# Patient Record
Sex: Female | Born: 1945 | Race: White | Hispanic: No | Smoking: Never smoker
Health system: Southern US, Community
[De-identification: ages and names within clinical notes are randomized; demographics above are authoritative.]

## PROBLEM LIST (undated history)

## (undated) DIAGNOSIS — R51 Headache: Secondary | ICD-10-CM

## (undated) DIAGNOSIS — G629 Polyneuropathy, unspecified: Secondary | ICD-10-CM

## (undated) DIAGNOSIS — T8859XA Other complications of anesthesia, initial encounter: Secondary | ICD-10-CM

## (undated) DIAGNOSIS — F329 Major depressive disorder, single episode, unspecified: Secondary | ICD-10-CM

## (undated) DIAGNOSIS — J449 Chronic obstructive pulmonary disease, unspecified: Secondary | ICD-10-CM

## (undated) DIAGNOSIS — F32A Depression, unspecified: Secondary | ICD-10-CM

## (undated) DIAGNOSIS — IMO0002 Reserved for concepts with insufficient information to code with codable children: Secondary | ICD-10-CM

## (undated) DIAGNOSIS — H3530 Unspecified macular degeneration: Secondary | ICD-10-CM

## (undated) DIAGNOSIS — G2581 Restless legs syndrome: Secondary | ICD-10-CM

## (undated) DIAGNOSIS — M329 Systemic lupus erythematosus, unspecified: Secondary | ICD-10-CM

## (undated) DIAGNOSIS — K219 Gastro-esophageal reflux disease without esophagitis: Secondary | ICD-10-CM

## (undated) DIAGNOSIS — I10 Essential (primary) hypertension: Secondary | ICD-10-CM

## (undated) DIAGNOSIS — M797 Fibromyalgia: Secondary | ICD-10-CM

## (undated) DIAGNOSIS — J45909 Unspecified asthma, uncomplicated: Secondary | ICD-10-CM

## (undated) DIAGNOSIS — G4733 Obstructive sleep apnea (adult) (pediatric): Secondary | ICD-10-CM

## (undated) DIAGNOSIS — M1990 Unspecified osteoarthritis, unspecified site: Secondary | ICD-10-CM

## (undated) DIAGNOSIS — I471 Supraventricular tachycardia: Secondary | ICD-10-CM

## (undated) DIAGNOSIS — G43909 Migraine, unspecified, not intractable, without status migrainosus: Secondary | ICD-10-CM

## (undated) DIAGNOSIS — T4145XA Adverse effect of unspecified anesthetic, initial encounter: Secondary | ICD-10-CM

## (undated) DIAGNOSIS — K279 Peptic ulcer, site unspecified, unspecified as acute or chronic, without hemorrhage or perforation: Secondary | ICD-10-CM

## (undated) DIAGNOSIS — E785 Hyperlipidemia, unspecified: Secondary | ICD-10-CM

## (undated) HISTORY — PX: APPENDECTOMY: SHX54

## (undated) HISTORY — PX: NEUROMA SURGERY: SHX722

## (undated) HISTORY — DX: Peptic ulcer, site unspecified, unspecified as acute or chronic, without hemorrhage or perforation: K27.9

## (undated) HISTORY — PX: CERVICAL FUSION: SHX112

## (undated) HISTORY — PX: CARPAL TUNNEL RELEASE: SHX101

## (undated) HISTORY — PX: KNEE ARTHROSCOPY: SUR90

## (undated) HISTORY — PX: EYE SURGERY: SHX253

## (undated) HISTORY — DX: Obstructive sleep apnea (adult) (pediatric): G47.33

## (undated) HISTORY — DX: Hyperlipidemia, unspecified: E78.5

## (undated) HISTORY — PX: BACK SURGERY: SHX140

## (undated) HISTORY — PX: TONSILLECTOMY: SUR1361

## (undated) HISTORY — PX: SHOULDER SURGERY: SHX246

## (undated) HISTORY — PX: ABDOMINAL HYSTERECTOMY: SHX81

## (undated) HISTORY — DX: Migraine, unspecified, not intractable, without status migrainosus: G43.909

---

## 1998-04-13 ENCOUNTER — Inpatient Hospital Stay (HOSPITAL_COMMUNITY): Admission: RE | Admit: 1998-04-13 | Discharge: 1998-04-15 | Payer: Self-pay | Admitting: Neurosurgery

## 1998-06-08 ENCOUNTER — Ambulatory Visit (HOSPITAL_COMMUNITY): Admission: RE | Admit: 1998-06-08 | Discharge: 1998-06-08 | Payer: Self-pay | Admitting: Neurosurgery

## 1998-06-08 ENCOUNTER — Encounter: Payer: Self-pay | Admitting: Neurosurgery

## 2000-07-10 ENCOUNTER — Other Ambulatory Visit: Admission: RE | Admit: 2000-07-10 | Discharge: 2000-07-10 | Payer: Self-pay | Admitting: Obstetrics and Gynecology

## 2000-09-09 ENCOUNTER — Ambulatory Visit (HOSPITAL_COMMUNITY): Admission: RE | Admit: 2000-09-09 | Discharge: 2000-09-09 | Payer: Self-pay | Admitting: Neurosurgery

## 2000-10-29 ENCOUNTER — Ambulatory Visit (HOSPITAL_BASED_OUTPATIENT_CLINIC_OR_DEPARTMENT_OTHER): Admission: RE | Admit: 2000-10-29 | Discharge: 2000-10-29 | Payer: Self-pay | Admitting: Neurosurgery

## 2002-01-13 ENCOUNTER — Ambulatory Visit (HOSPITAL_BASED_OUTPATIENT_CLINIC_OR_DEPARTMENT_OTHER): Admission: RE | Admit: 2002-01-13 | Discharge: 2002-01-13 | Payer: Self-pay | Admitting: *Deleted

## 2002-05-12 ENCOUNTER — Ambulatory Visit (HOSPITAL_BASED_OUTPATIENT_CLINIC_OR_DEPARTMENT_OTHER): Admission: RE | Admit: 2002-05-12 | Discharge: 2002-05-12 | Payer: Self-pay | Admitting: Internal Medicine

## 2002-05-20 ENCOUNTER — Ambulatory Visit (HOSPITAL_COMMUNITY): Admission: RE | Admit: 2002-05-20 | Discharge: 2002-05-20 | Payer: Self-pay | Admitting: *Deleted

## 2002-05-20 ENCOUNTER — Encounter (INDEPENDENT_AMBULATORY_CARE_PROVIDER_SITE_OTHER): Payer: Self-pay | Admitting: *Deleted

## 2002-12-21 ENCOUNTER — Encounter: Payer: Self-pay | Admitting: Family Medicine

## 2002-12-21 ENCOUNTER — Encounter: Admission: RE | Admit: 2002-12-21 | Discharge: 2002-12-21 | Payer: Self-pay | Admitting: Family Medicine

## 2003-11-20 ENCOUNTER — Other Ambulatory Visit: Admission: RE | Admit: 2003-11-20 | Discharge: 2003-11-20 | Payer: Self-pay | Admitting: Family Medicine

## 2004-02-07 ENCOUNTER — Encounter: Admission: RE | Admit: 2004-02-07 | Discharge: 2004-02-07 | Payer: Self-pay | Admitting: Family Medicine

## 2005-06-05 ENCOUNTER — Other Ambulatory Visit: Admission: RE | Admit: 2005-06-05 | Discharge: 2005-06-05 | Payer: Self-pay | Admitting: Family Medicine

## 2005-10-09 ENCOUNTER — Encounter: Admission: RE | Admit: 2005-10-09 | Discharge: 2005-10-09 | Payer: Self-pay | Admitting: Family Medicine

## 2006-06-02 ENCOUNTER — Other Ambulatory Visit: Admission: RE | Admit: 2006-06-02 | Discharge: 2006-06-02 | Payer: Self-pay | Admitting: Family Medicine

## 2006-11-16 ENCOUNTER — Encounter: Admission: RE | Admit: 2006-11-16 | Discharge: 2006-11-16 | Payer: Self-pay | Admitting: Family Medicine

## 2007-03-15 ENCOUNTER — Encounter: Admission: RE | Admit: 2007-03-15 | Discharge: 2007-03-15 | Payer: Self-pay | Admitting: Internal Medicine

## 2007-04-19 ENCOUNTER — Ambulatory Visit (HOSPITAL_COMMUNITY): Admission: RE | Admit: 2007-04-19 | Discharge: 2007-04-20 | Payer: Self-pay | Admitting: Orthopaedic Surgery

## 2008-01-04 ENCOUNTER — Encounter: Admission: RE | Admit: 2008-01-04 | Discharge: 2008-01-04 | Payer: Self-pay | Admitting: Family Medicine

## 2009-01-05 ENCOUNTER — Encounter: Admission: RE | Admit: 2009-01-05 | Discharge: 2009-01-05 | Payer: Self-pay | Admitting: Family Medicine

## 2009-02-26 ENCOUNTER — Encounter: Admission: RE | Admit: 2009-02-26 | Discharge: 2009-04-12 | Payer: Self-pay | Admitting: Rheumatology

## 2010-01-15 ENCOUNTER — Encounter: Admission: RE | Admit: 2010-01-15 | Discharge: 2010-01-15 | Payer: Self-pay | Admitting: Family Medicine

## 2011-01-07 ENCOUNTER — Other Ambulatory Visit: Payer: Self-pay | Admitting: Family Medicine

## 2011-01-07 DIAGNOSIS — Z1231 Encounter for screening mammogram for malignant neoplasm of breast: Secondary | ICD-10-CM

## 2011-01-27 ENCOUNTER — Ambulatory Visit
Admission: RE | Admit: 2011-01-27 | Discharge: 2011-01-27 | Disposition: A | Discharge status: Home / Self Care | Payer: MEDICARE | Source: Ambulatory Visit | Attending: Family Medicine | Admitting: Family Medicine

## 2011-01-27 DIAGNOSIS — Z1231 Encounter for screening mammogram for malignant neoplasm of breast: Secondary | ICD-10-CM

## 2011-02-25 NOTE — Op Note (Signed)
NAMEChrys Watts, Jessica Watts             ACCOUNT NO.:  [id number]   MEDICAL RECORD NO.:  [id number]          PATIENT TYPE:  AMB   LOCATION:  SDS                          FACILITY:  [facility]   PHYSICIAN:  Mark C. Ophelia Charter, M.D.    DATE OF BIRTH:  1946-03-08   DATE OF PROCEDURE:  04/19/2007  DATE OF DISCHARGE:                               OPERATIVE REPORT   PREOPERATIVE DIAGNOSES:  Left shoulder rotator cuff tear.   POSTOPERATIVE DIAGNOSES:  Left shoulder superior labral SLAP tear,  biceps tendinopathy and rotator cuff tear.   PROCEDURE:  Diagnostic and operative arthroscopy, left shoulder.  Arthroscopic labral debridement.  Biceps tendon arthroscopic release.  Open biceps tenodesis and rotator cuff repair.   SURGEON:  Annell Greening, MD.   ANESTHESIA:  Preoperative scalene block plus 8 mL local plus general  anesthesia.   PROCEDURE:  After induction of general anesthesia, the patient was  placed in the beach-chair position, preoperative antibiotics were given,  time-out procedure was taken once the patient had been positioned and  prepped as well as draped.  The usual split sheets, impervious  stockinette, Coban, arthroscopic sheets, drapes and pouch were applied.  The arthroscope was introduced from a posterior approach, shoulder was  visualized, there was excellent block and significant laxity, easy  visualization of the shoulder, no glenohumeral arthropathy with fraying  and tearing of the posterior labrum posterior to the biceps anchor and  anterior to the biceps anchor was peeled off of bone.  There was some  pinning that extended into the subscap pouch but no true Bankart lesion  or Hill-Sachs lesion. The undersurface of the rotator cuff showed some  tearing following the supraspinatus which did not appear as bed as the  MRI suggested.  The biceps tendon anchor was debrided with the shaver  and then released with the arthroscopic scissors. The incision was made  along the anterior aspect  of the acromion, the deltoid was peeled off,  shoulder was flexed up, bicipital groove was palpated, biceps tendon was  delivered after spreading in line with the fibers, grasping with a right-  angle clamp and delivering it through the incision. 0.2 mm of tendon was  cut off, the bicipital groove was freshened up to good bleeding bone,  holes drilled. A 7-mm reamer and 7 x 23 biocorkscrew was used to  biotenodese the biceps tendon after a #2 FiberWire suture had been  placed in a Bunnell weave with both ends of the suture exiting right  through the end of the tendon.  The longest end was run through the tip  of the screw using the wire feeder.  The screw was pushed in the hole  along with the tendon, tightened down securely.  Pulled on, tested and  was secure.  However reached full extension, the two ends of suture were  tied together. Inspection showed the tendon went in the hole directly  with the biotenodesis screw.  After irrigation, the biceps tendon area  was then inspected and was dry.  The rotator cuff tear was seen at the  supraspinatus portion and was cut back 2-3  mm with good bleeding bone  surface and fixed with a 5 mm biotenodesis corkscrew. The bone was  tapped after using the awl. Insertion of the screw and then suturing  down of the rotator cuff to good bleeding bone surface oversewing over  the top.  The undersurface the chromium had been removed removing about  half the acromion smoothing it with a rasp and file. The distal aspect  of clavicle was not palpable after irrigation with saline solution and  resection of the bursa having been completed, the deltoid was  ____repaired______  through holes in the bone made with the Mayo needle  repairing the deltoid anatomically.  2-0 Vicryl in the subcutaneous  tissue, 4-0 Vicryl subcuticular closure, 4-0 nylon skin closure and  arthroscopic portals.  Postop dressing, Tincture of Benzoin and Steri-  Strips, 8 mL Marcaine in the  skin, local injection, postop dressing,  tape and sling.  Instrument count and needle count was correct.      Mark C. Ophelia Charter, M.D.  Electronically Signed     MCY/MEDQ  D:  04/19/2007  T:  04/19/2007  Job:  [id number]

## 2011-07-29 LAB — COMPREHENSIVE METABOLIC PANEL
Albumin: 3.9
Alkaline Phosphatase: 58
BUN: 9
Creatinine, Ser: 0.73
Glucose, Bld: 105 — ABNORMAL HIGH
Potassium: 3.8
Total Bilirubin: 0.6
Total Protein: 6.6

## 2011-07-29 LAB — CBC
HCT: 41
Hemoglobin: 13.8
MCV: 88.3
Platelets: 226
RDW: 13.2

## 2011-07-29 LAB — APTT: aPTT: 29

## 2011-07-29 LAB — PROTIME-INR: INR: 0.9

## 2011-07-29 LAB — DIFFERENTIAL
Basophils Absolute: 0
Basophils Relative: 1
Lymphocytes Relative: 34
Monocytes Relative: 7
Neutro Abs: 3.7
Neutrophils Relative %: 58

## 2012-03-18 ENCOUNTER — Other Ambulatory Visit: Payer: Self-pay | Admitting: Family Medicine

## 2012-03-18 DIAGNOSIS — Z1231 Encounter for screening mammogram for malignant neoplasm of breast: Secondary | ICD-10-CM

## 2012-03-29 ENCOUNTER — Ambulatory Visit
Admission: RE | Admit: 2012-03-29 | Discharge: 2012-03-29 | Disposition: A | Discharge status: Home / Self Care | Payer: Medicare Other | Source: Ambulatory Visit | Attending: Family Medicine | Admitting: Family Medicine

## 2012-03-29 DIAGNOSIS — Z1231 Encounter for screening mammogram for malignant neoplasm of breast: Secondary | ICD-10-CM

## 2012-07-12 ENCOUNTER — Other Ambulatory Visit: Payer: Self-pay | Admitting: Neurosurgery

## 2012-07-12 DIAGNOSIS — M5412 Radiculopathy, cervical region: Secondary | ICD-10-CM

## 2012-07-13 ENCOUNTER — Ambulatory Visit
Admission: RE | Admit: 2012-07-13 | Discharge: 2012-07-13 | Disposition: A | Discharge status: Home / Self Care | Payer: Medicare Other | Source: Ambulatory Visit | Attending: Neurosurgery | Admitting: Neurosurgery

## 2012-07-13 DIAGNOSIS — M5412 Radiculopathy, cervical region: Secondary | ICD-10-CM

## 2012-07-16 ENCOUNTER — Encounter (HOSPITAL_COMMUNITY)
Admission: RE | Admit: 2012-07-16 | Discharge: 2012-07-16 | Disposition: A | Discharge status: Home / Self Care | Payer: Medicare Other | Source: Ambulatory Visit | Attending: Neurosurgery | Admitting: Neurosurgery

## 2012-07-16 ENCOUNTER — Other Ambulatory Visit: Payer: Self-pay | Admitting: Neurosurgery

## 2012-07-16 ENCOUNTER — Encounter (HOSPITAL_COMMUNITY): Payer: Self-pay | Admitting: Pharmacy Technician

## 2012-07-16 ENCOUNTER — Encounter (HOSPITAL_COMMUNITY): Payer: Self-pay

## 2012-07-16 HISTORY — DX: Other complications of anesthesia, initial encounter: T88.59XA

## 2012-07-16 HISTORY — DX: Systemic lupus erythematosus, unspecified: M32.9

## 2012-07-16 HISTORY — DX: Unspecified asthma, uncomplicated: J45.909

## 2012-07-16 HISTORY — DX: Gastro-esophageal reflux disease without esophagitis: K21.9

## 2012-07-16 HISTORY — DX: Adverse effect of unspecified anesthetic, initial encounter: T41.45XA

## 2012-07-16 HISTORY — DX: Headache: R51

## 2012-07-16 HISTORY — DX: Reserved for concepts with insufficient information to code with codable children: IMO0002

## 2012-07-16 HISTORY — DX: Fibromyalgia: M79.7

## 2012-07-16 HISTORY — DX: Essential (primary) hypertension: I10

## 2012-07-16 LAB — BASIC METABOLIC PANEL WITH GFR
BUN: 8 mg/dL (ref 6–23)
CO2: 29 meq/L (ref 19–32)
Calcium: 10 mg/dL (ref 8.4–10.5)
Chloride: 101 meq/L (ref 96–112)
Creatinine, Ser: 0.75 mg/dL (ref 0.50–1.10)
GFR calc Af Amer: >90 mL/min
GFR calc non Af Amer: 88 mL/min — ABNORMAL LOW
Glucose, Bld: 99 mg/dL (ref 70–99)
Potassium: 3.8 meq/L (ref 3.5–5.1)
Sodium: 138 meq/L (ref 135–145)

## 2012-07-16 LAB — CBC
HCT: 41.4 % (ref 36.0–46.0)
Hemoglobin: 14.6 g/dL (ref 12.0–15.0)
MCH: 29.9 pg (ref 26.0–34.0)
MCHC: 35.3 g/dL (ref 30.0–36.0)
MCV: 84.7 fL (ref 78.0–100.0)
Platelets: 225 10*3/uL (ref 150–400)
RBC: 4.89 MIL/uL (ref 3.87–5.11)
RDW: 12.5 % (ref 11.5–15.5)
WBC: 8.4 10*3/uL (ref 4.0–10.5)

## 2012-07-16 LAB — SURGICAL PCR SCREEN
MRSA, PCR: NEGATIVE
Staphylococcus aureus: NEGATIVE

## 2012-07-16 NOTE — Pre-Procedure Instructions (Addendum)
20 Jessica Watts  07/16/2012   Your procedure is scheduled on:  Tuesday October 8  Report to [facility] at 10:45 AM.  Call this number if you have problems the morning of surgery: [phone]   Remember:   Do not eat or drink: After Midnight.    Take these medicines the morning of surgery with A SIP OF WATER: Zyrtec (cetirizine), Prilosec (omeprazole), Ultram (tramadol). May take Hydrocodone, Valium if needed   Do not wear jewelry, make-up or nail polish.  Do not wear lotions, powders, or perfumes. You may wear deodorant.  Do not shave 48 hours prior to surgery. Men may shave face and neck.  Do not bring valuables to the hospital.  Contacts, dentures or bridgework may not be worn into surgery.  Leave suitcase in the car. After surgery it may be brought to your room.  For patients admitted to the hospital, checkout time is 11:00 AM the day of discharge.   Patients discharged the day of surgery will not be allowed to drive home.  Name and phone number of your driver: NA  Special Instructions: Shower using CHG 2 nights before surgery and the night before surgery.  If you shower the day of surgery use CHG.  Use special wash - you have one bottle of CHG for all showers.  You should use approximately 1/3 of the bottle for each shower.   Please read over the following fact sheets that you were given: Pain Booklet, Coughing and Deep Breathing and Surgical Site Infection Prevention

## 2012-07-16 NOTE — Progress Notes (Addendum)
Requested CXR from [facility].  Pt has sleep apnea, not using CPAP, last sleep study 2000. Anesthesia to review EKG.

## 2012-07-19 MED ORDER — CEFAZOLIN SODIUM-DEXTROSE 2-3 GM-% IV SOLR
2.0000 g | INTRAVENOUS | Status: AC
  Administered 2012-07-20: 2 g via INTRAVENOUS
  Filled 2012-07-19: qty 50

## 2012-07-19 MED ORDER — DEXAMETHASONE SODIUM PHOSPHATE 10 MG/ML IJ SOLN
10.0000 mg | INTRAMUSCULAR | Status: AC
  Administered 2012-07-20: 10 mg via INTRAVENOUS
  Filled 2012-07-19: qty 1

## 2012-07-19 NOTE — Consult Note (Signed)
Anesthesia chart review: Patient is a 66 year old female posted for two-level ACDF and left ulnar nerve decompression on 06/20/2012 by Dr. Jordan Likes.  History includes nonsmoker, hypertension, obstructive sleep apnea, obesity, fibromyalgia, asthma, lupus, GERD, headaches, prior cervical fusion and back surgery.  Anesthesia history includes "difficulties related to being Native American; multiple days in hospital after hysterectomy years ago."  She had a left rotator cuff repair on 04/19/07 with scalene block and GA.  Patient's anesthesia records from this surgery are on her physical chart for review if needed.  EKG on 07/16/12 showed NSR, low voltage QRS, baseline artifact.  It was not felt significantly changed from her prior EKG from 04/15/07 (see Muse).  Labs noted.    A CXR was requested from [facility], but they said there was no CXR available.  If we are unable to obtain a CXR report that was done within the past year, then she will need one repeated on the day of surgery.  Shonna Chock, PA-C

## 2012-07-20 ENCOUNTER — Encounter (HOSPITAL_COMMUNITY): Payer: Self-pay | Admitting: Vascular Surgery

## 2012-07-20 ENCOUNTER — Inpatient Hospital Stay (HOSPITAL_COMMUNITY)
Admission: RE | Admit: 2012-07-20 | Discharge: 2012-07-21 | DRG: 472 | Disposition: A | Discharge status: Home / Self Care | Payer: Medicare Other | Source: Ambulatory Visit | Attending: Neurosurgery | Admitting: Neurosurgery

## 2012-07-20 ENCOUNTER — Encounter (HOSPITAL_COMMUNITY): Payer: Self-pay | Admitting: Neurosurgery

## 2012-07-20 ENCOUNTER — Inpatient Hospital Stay (HOSPITAL_COMMUNITY): Payer: Medicare Other

## 2012-07-20 ENCOUNTER — Encounter (HOSPITAL_COMMUNITY)
Admission: RE | Disposition: A | Discharge status: Home / Self Care | Payer: Self-pay | Source: Ambulatory Visit | Attending: Neurosurgery

## 2012-07-20 ENCOUNTER — Inpatient Hospital Stay (HOSPITAL_COMMUNITY): Payer: Medicare Other | Admitting: Vascular Surgery

## 2012-07-20 DIAGNOSIS — Y831 Surgical operation with implant of artificial internal device as the cause of abnormal reaction of the patient, or of later complication, without mention of misadventure at the time of the procedure: Secondary | ICD-10-CM | POA: Diagnosis present

## 2012-07-20 DIAGNOSIS — M4712 Other spondylosis with myelopathy, cervical region: Principal | ICD-10-CM | POA: Diagnosis present

## 2012-07-20 DIAGNOSIS — Y92009 Unspecified place in unspecified non-institutional (private) residence as the place of occurrence of the external cause: Secondary | ICD-10-CM

## 2012-07-20 DIAGNOSIS — T84498A Other mechanical complication of other internal orthopedic devices, implants and grafts, initial encounter: Secondary | ICD-10-CM | POA: Diagnosis present

## 2012-07-20 DIAGNOSIS — M5412 Radiculopathy, cervical region: Secondary | ICD-10-CM | POA: Diagnosis present

## 2012-07-20 DIAGNOSIS — Z23 Encounter for immunization: Secondary | ICD-10-CM

## 2012-07-20 DIAGNOSIS — G5620 Lesion of ulnar nerve, unspecified upper limb: Secondary | ICD-10-CM | POA: Diagnosis present

## 2012-07-20 HISTORY — PX: ANTERIOR CERVICAL DECOMP/DISCECTOMY FUSION: SHX1161

## 2012-07-20 HISTORY — PX: ULNAR NERVE TRANSPOSITION: SHX2595

## 2012-07-20 LAB — TYPE AND SCREEN

## 2012-07-20 LAB — CBC WITH DIFFERENTIAL/PLATELET
Basophils Relative: 1 % (ref 0–1)
Eosinophils Absolute: 0.2 10*3/uL (ref 0.0–0.7)
HCT: 41.2 % (ref 36.0–46.0)
Hemoglobin: 14.7 g/dL (ref 12.0–15.0)
MCH: 30.2 pg (ref 26.0–34.0)
MCHC: 35.7 g/dL (ref 30.0–36.0)
Monocytes Absolute: 0.6 10*3/uL (ref 0.1–1.0)
Monocytes Relative: 7 % (ref 3–12)
Neutro Abs: 4.3 10*3/uL (ref 1.7–7.7)

## 2012-07-20 LAB — ABO/RH: ABO/RH(D): A POS

## 2012-07-20 SURGERY — ANTERIOR CERVICAL DECOMPRESSION/DISCECTOMY FUSION 2 LEVELS
Anesthesia: General | Site: Neck | Wound class: Clean

## 2012-07-20 SURGERY — Surgical Case
Anesthesia: *Unknown

## 2012-07-20 MED ORDER — ONDANSETRON HCL 4 MG/2ML IJ SOLN: 4.0000 mg | Freq: Once | INTRAMUSCULAR | Status: DC | PRN

## 2012-07-20 MED ORDER — SENNA 8.6 MG PO TABS
1.0000 | ORAL_TABLET | Freq: Two times a day (BID) | ORAL | Status: DC
  Administered 2012-07-20 – 2012-07-21 (×2): 8.6 mg via ORAL
  Filled 2012-07-20 (×3): qty 1

## 2012-07-20 MED ORDER — HYDROMORPHONE HCL PF 1 MG/ML IJ SOLN
INTRAMUSCULAR | Status: AC
  Administered 2012-07-20: 0.5 mg via INTRAVENOUS
  Filled 2012-07-20: qty 1

## 2012-07-20 MED ORDER — THROMBIN 5000 UNITS EX SOLR
CUTANEOUS | Status: DC | PRN
  Administered 2012-07-20 (×2): 5000 [IU] via TOPICAL

## 2012-07-20 MED ORDER — OXYCODONE HCL 5 MG PO TABS
5.0000 mg | ORAL_TABLET | Freq: Once | ORAL | Status: AC | PRN
  Administered 2012-07-20: 5 mg via ORAL

## 2012-07-20 MED ORDER — VITAMIN E 180 MG (400 UNIT) PO CAPS
400.0000 [IU] | ORAL_CAPSULE | Freq: Every day | ORAL | Status: DC
  Administered 2012-07-21: 400 [IU] via ORAL
  Filled 2012-07-20: qty 1

## 2012-07-20 MED ORDER — SODIUM CHLORIDE 0.9 % IJ SOLN: 3.0000 mL | INTRAMUSCULAR | Status: DC | PRN

## 2012-07-20 MED ORDER — MEPERIDINE HCL 25 MG/ML IJ SOLN: 6.2500 mg | INTRAMUSCULAR | Status: DC | PRN

## 2012-07-20 MED ORDER — ACETAMINOPHEN 650 MG RE SUPP: 650.0000 mg | RECTAL | Status: DC | PRN

## 2012-07-20 MED ORDER — HYDROCHLOROTHIAZIDE 12.5 MG PO CAPS
12.5000 mg | ORAL_CAPSULE | Freq: Every day | ORAL | Status: DC
  Administered 2012-07-21: 12.5 mg via ORAL
  Filled 2012-07-20: qty 1

## 2012-07-20 MED ORDER — ADULT MULTIVITAMIN W/MINERALS CH
1.0000 | ORAL_TABLET | Freq: Every day | ORAL | Status: DC
  Administered 2012-07-21: 1 via ORAL
  Filled 2012-07-20: qty 1

## 2012-07-20 MED ORDER — ACETAMINOPHEN 325 MG PO TABS: 650.0000 mg | ORAL_TABLET | ORAL | Status: DC | PRN

## 2012-07-20 MED ORDER — MIDAZOLAM HCL 5 MG/5ML IJ SOLN
INTRAMUSCULAR | Status: DC | PRN
  Administered 2012-07-20: 2 mg via INTRAVENOUS

## 2012-07-20 MED ORDER — CYCLOBENZAPRINE HCL 10 MG PO TABS: 10.0000 mg | ORAL_TABLET | Freq: Three times a day (TID) | ORAL | Status: DC | PRN

## 2012-07-20 MED ORDER — OXYCODONE-ACETAMINOPHEN 5-325 MG PO TABS
1.0000 | ORAL_TABLET | ORAL | Status: DC | PRN
  Administered 2012-07-20 – 2012-07-21 (×4): 2 via ORAL
  Filled 2012-07-20 (×4): qty 2

## 2012-07-20 MED ORDER — FLEET ENEMA 7-19 GM/118ML RE ENEM: 1.0000 | ENEMA | Freq: Once | RECTAL | Status: AC | PRN

## 2012-07-20 MED ORDER — HYDROMORPHONE HCL PF 1 MG/ML IJ SOLN
0.5000 mg | INTRAMUSCULAR | Status: DC | PRN
  Administered 2012-07-20 (×2): 1 mg via INTRAVENOUS
  Filled 2012-07-20 (×2): qty 1

## 2012-07-20 MED ORDER — PROPOFOL 10 MG/ML IV BOLUS
INTRAVENOUS | Status: DC | PRN
  Administered 2012-07-20: 120 mg via INTRAVENOUS

## 2012-07-20 MED ORDER — HYDROMORPHONE HCL PF 1 MG/ML IJ SOLN
0.2500 mg | INTRAMUSCULAR | Status: DC | PRN
  Administered 2012-07-20 (×4): 0.5 mg via INTRAVENOUS

## 2012-07-20 MED ORDER — NEOSTIGMINE METHYLSULFATE 1 MG/ML IJ SOLN
INTRAMUSCULAR | Status: DC | PRN
  Administered 2012-07-20: 3 mg via INTRAVENOUS

## 2012-07-20 MED ORDER — SODIUM CHLORIDE 0.9 % IR SOLN
Status: DC | PRN
  Administered 2012-07-20: 13:00:00

## 2012-07-20 MED ORDER — MENTHOL 3 MG MT LOZG
1.0000 | LOZENGE | OROMUCOSAL | Status: DC | PRN
  Filled 2012-07-20: qty 9

## 2012-07-20 MED ORDER — ALUM & MAG HYDROXIDE-SIMETH 200-200-20 MG/5ML PO SUSP: 30.0000 mL | Freq: Four times a day (QID) | ORAL | Status: DC | PRN

## 2012-07-20 MED ORDER — ROCURONIUM BROMIDE 100 MG/10ML IV SOLN
INTRAVENOUS | Status: DC | PRN
  Administered 2012-07-20: 50 mg via INTRAVENOUS
  Administered 2012-07-20: 10 mg via INTRAVENOUS

## 2012-07-20 MED ORDER — HEMOSTATIC AGENTS (NO CHARGE) OPTIME
TOPICAL | Status: DC | PRN
  Administered 2012-07-20: 1 via TOPICAL

## 2012-07-20 MED ORDER — ASPIRIN EC 81 MG PO TBEC
81.0000 mg | DELAYED_RELEASE_TABLET | Freq: Every day | ORAL | Status: DC
  Administered 2012-07-21: 81 mg via ORAL
  Filled 2012-07-20: qty 1

## 2012-07-20 MED ORDER — SODIUM CHLORIDE 0.9 % IV SOLN: 250.0000 mL | INTRAVENOUS | Status: DC

## 2012-07-20 MED ORDER — GABAPENTIN 300 MG PO CAPS
300.0000 mg | ORAL_CAPSULE | Freq: Three times a day (TID) | ORAL | Status: DC
  Administered 2012-07-20 – 2012-07-21 (×2): 300 mg via ORAL
  Filled 2012-07-20 (×4): qty 1

## 2012-07-20 MED ORDER — PHENOL 1.4 % MT LIQD
1.0000 | OROMUCOSAL | Status: DC | PRN
  Administered 2012-07-20: 1 via OROMUCOSAL
  Filled 2012-07-20: qty 177

## 2012-07-20 MED ORDER — LISINOPRIL 20 MG PO TABS
20.0000 mg | ORAL_TABLET | Freq: Every day | ORAL | Status: DC
  Administered 2012-07-21: 20 mg via ORAL
  Filled 2012-07-20: qty 1

## 2012-07-20 MED ORDER — HYDROCODONE-ACETAMINOPHEN 5-325 MG PO TABS: 1.0000 | ORAL_TABLET | ORAL | Status: DC | PRN

## 2012-07-20 MED ORDER — OXYCODONE HCL 5 MG/5ML PO SOLN: 5.0000 mg | Freq: Once | ORAL | Status: AC | PRN

## 2012-07-20 MED ORDER — BUPIVACAINE HCL (PF) 0.25 % IJ SOLN
INTRAMUSCULAR | Status: DC | PRN
  Administered 2012-07-20: 30 mL

## 2012-07-20 MED ORDER — SODIUM CHLORIDE 0.9 % IV SOLN
INTRAVENOUS | Status: AC
  Filled 2012-07-20: qty 500

## 2012-07-20 MED ORDER — PANTOPRAZOLE SODIUM 40 MG PO TBEC
40.0000 mg | DELAYED_RELEASE_TABLET | Freq: Every day | ORAL | Status: DC
  Administered 2012-07-21: 40 mg via ORAL
  Filled 2012-07-20: qty 1

## 2012-07-20 MED ORDER — POLYETHYLENE GLYCOL 3350 17 G PO PACK
17.0000 g | PACK | Freq: Every day | ORAL | Status: DC | PRN
  Filled 2012-07-20: qty 1

## 2012-07-20 MED ORDER — FLUTICASONE PROPIONATE 50 MCG/ACT NA SUSP
1.0000 | Freq: Every day | NASAL | Status: DC
  Filled 2012-07-20: qty 16

## 2012-07-20 MED ORDER — LORATADINE 10 MG PO TABS
10.0000 mg | ORAL_TABLET | Freq: Every day | ORAL | Status: DC
  Administered 2012-07-21: 10 mg via ORAL
  Filled 2012-07-20: qty 1

## 2012-07-20 MED ORDER — 0.9 % SODIUM CHLORIDE (POUR BTL) OPTIME
TOPICAL | Status: DC | PRN
  Administered 2012-07-20: 1000 mL

## 2012-07-20 MED ORDER — LISINOPRIL-HYDROCHLOROTHIAZIDE 20-12.5 MG PO TABS: 1.0000 | ORAL_TABLET | Freq: Every day | ORAL | Status: DC

## 2012-07-20 MED ORDER — CEFAZOLIN SODIUM 1-5 GM-% IV SOLN
1.0000 g | Freq: Three times a day (TID) | INTRAVENOUS | Status: AC
  Administered 2012-07-20 – 2012-07-21 (×2): 1 g via INTRAVENOUS
  Filled 2012-07-20 (×2): qty 50

## 2012-07-20 MED ORDER — LACTATED RINGERS IV SOLN
INTRAVENOUS | Status: DC | PRN
  Administered 2012-07-20 (×2): via INTRAVENOUS

## 2012-07-20 MED ORDER — LIDOCAINE HCL (CARDIAC) 20 MG/ML IV SOLN
INTRAVENOUS | Status: DC | PRN
  Administered 2012-07-20: 100 mg via INTRAVENOUS

## 2012-07-20 MED ORDER — OXYCODONE HCL 5 MG PO TABS
ORAL_TABLET | ORAL | Status: AC
  Filled 2012-07-20: qty 1

## 2012-07-20 MED ORDER — ONDANSETRON HCL 4 MG/2ML IJ SOLN: 4.0000 mg | INTRAMUSCULAR | Status: DC | PRN

## 2012-07-20 MED ORDER — GLYCOPYRROLATE 0.2 MG/ML IJ SOLN
INTRAMUSCULAR | Status: DC | PRN
  Administered 2012-07-20: 0.4 mg via INTRAVENOUS

## 2012-07-20 MED ORDER — INFLUENZA VIRUS VACC SPLIT PF IM SUSP
0.5000 mL | INTRAMUSCULAR | Status: AC
  Administered 2012-07-21: 0.5 mL via INTRAMUSCULAR
  Filled 2012-07-20: qty 0.5

## 2012-07-20 MED ORDER — BACITRACIN 50000 UNITS IM SOLR
INTRAMUSCULAR | Status: AC
  Filled 2012-07-20: qty 1

## 2012-07-20 MED ORDER — HYDROXYCHLOROQUINE SULFATE 200 MG PO TABS
200.0000 mg | ORAL_TABLET | Freq: Every day | ORAL | Status: DC
  Administered 2012-07-21: 200 mg via ORAL
  Filled 2012-07-20: qty 1

## 2012-07-20 MED ORDER — SODIUM CHLORIDE 0.9 % IJ SOLN: 3.0000 mL | Freq: Two times a day (BID) | INTRAMUSCULAR | Status: DC

## 2012-07-20 MED ORDER — TRAMADOL HCL 50 MG PO TABS: 50.0000 mg | ORAL_TABLET | Freq: Four times a day (QID) | ORAL | Status: DC | PRN

## 2012-07-20 MED ORDER — BISACODYL 10 MG RE SUPP: 10.0000 mg | Freq: Every day | RECTAL | Status: DC | PRN

## 2012-07-20 MED ORDER — FENTANYL CITRATE 0.05 MG/ML IJ SOLN
INTRAMUSCULAR | Status: DC | PRN
  Administered 2012-07-20 (×2): 25 ug via INTRAVENOUS
  Administered 2012-07-20: 100 ug via INTRAVENOUS
  Administered 2012-07-20 (×2): 25 ug via INTRAVENOUS
  Administered 2012-07-20: 50 ug via INTRAVENOUS

## 2012-07-20 MED ORDER — ONDANSETRON HCL 4 MG/2ML IJ SOLN
INTRAMUSCULAR | Status: DC | PRN
  Administered 2012-07-20: 4 mg via INTRAVENOUS

## 2012-07-20 SURGICAL SUPPLY — 70 items
BAG DECANTER FOR FLEXI CONT (MISCELLANEOUS) ×6 IMPLANT
BANDAGE ELASTIC 3 VELCRO ST LF (GAUZE/BANDAGES/DRESSINGS) ×3 IMPLANT
BANDAGE ELASTIC 4 VELCRO ST LF (GAUZE/BANDAGES/DRESSINGS) ×3 IMPLANT
BANDAGE GAUZE ELAST BULKY 4 IN (GAUZE/BANDAGES/DRESSINGS) ×3 IMPLANT
BENZOIN TINCTURE PRP APPL 2/3 (GAUZE/BANDAGES/DRESSINGS) ×3 IMPLANT
BLADE SURG 15 STRL LF DISP TIS (BLADE) ×2 IMPLANT
BLADE SURG 15 STRL SS (BLADE) ×1
BRUSH SCRUB EZ PLAIN DRY (MISCELLANEOUS) ×6 IMPLANT
BUR MATCHSTICK NEURO 3.0 LAGG (BURR) ×3 IMPLANT
CANISTER SUCTION 2500CC (MISCELLANEOUS) ×6 IMPLANT
CLOTH BEACON ORANGE TIMEOUT ST (SAFETY) ×6 IMPLANT
CONT SPEC 4OZ CLIKSEAL STRL BL (MISCELLANEOUS) ×3 IMPLANT
CORDS BIPOLAR (ELECTRODE) ×3 IMPLANT
DRAPE C-ARM 42X72 X-RAY (DRAPES) ×6 IMPLANT
DRAPE EXTREMITY T 121X128X90 (DRAPE) ×3 IMPLANT
DRAPE LAPAROTOMY 100X72 PEDS (DRAPES) ×3 IMPLANT
DRAPE MICROSCOPE ZEISS OPMI (DRAPES) ×3 IMPLANT
DRAPE POUCH INSTRU U-SHP 10X18 (DRAPES) ×3 IMPLANT
DRILL BIT (BIT) ×3 IMPLANT
ELECT CAUTERY BLADE 6.4 (BLADE) ×3 IMPLANT
ELECT COATED BLADE 2.86 ST (ELECTRODE) ×3 IMPLANT
ELECT REM PT RETURN 9FT ADLT (ELECTROSURGICAL) ×6
ELECTRODE REM PT RTRN 9FT ADLT (ELECTROSURGICAL) ×4 IMPLANT
GAUZE SPONGE 4X4 16PLY XRAY LF (GAUZE/BANDAGES/DRESSINGS) ×3 IMPLANT
GLOVE ECLIPSE 7.0 STRL STRAW (GLOVE) ×3 IMPLANT
GLOVE ECLIPSE 7.5 STRL STRAW (GLOVE) ×6 IMPLANT
GLOVE ECLIPSE 8.5 STRL (GLOVE) ×6 IMPLANT
GLOVE EXAM NITRILE LRG STRL (GLOVE) IMPLANT
GLOVE EXAM NITRILE MD LF STRL (GLOVE) IMPLANT
GLOVE EXAM NITRILE XL STR (GLOVE) IMPLANT
GLOVE EXAM NITRILE XS STR PU (GLOVE) IMPLANT
GLOVE INDICATOR 7.0 STRL GRN (GLOVE) ×6 IMPLANT
GLOVE INDICATOR 7.5 STRL GRN (GLOVE) ×3 IMPLANT
GOWN BRE IMP SLV AUR LG STRL (GOWN DISPOSABLE) ×6 IMPLANT
GOWN BRE IMP SLV AUR XL STRL (GOWN DISPOSABLE) ×9 IMPLANT
GOWN STRL REIN 2XL LVL4 (GOWN DISPOSABLE) IMPLANT
HEAD HALTER (SOFTGOODS) ×3 IMPLANT
HEMOSTAT SURGICEL 2X14 (HEMOSTASIS) IMPLANT
KIT BASIN OR (CUSTOM PROCEDURE TRAY) ×6 IMPLANT
KIT ROOM TURNOVER OR (KITS) ×6 IMPLANT
NEEDLE HYPO 25X1 1.5 SAFETY (NEEDLE) ×3 IMPLANT
NEEDLE SPNL 20GX3.5 QUINCKE YW (NEEDLE) ×3 IMPLANT
NS IRRIG 1000ML POUR BTL (IV SOLUTION) ×6 IMPLANT
PACK LAMINECTOMY NEURO (CUSTOM PROCEDURE TRAY) ×3 IMPLANT
PACK SURGICAL SETUP 50X90 (CUSTOM PROCEDURE TRAY) ×3 IMPLANT
PAD ARMBOARD 7.5X6 YLW CONV (MISCELLANEOUS) ×9 IMPLANT
PENCIL BUTTON HOLSTER BLD 10FT (ELECTRODE) ×3 IMPLANT
PLATE VISION ELITE 40MM (Plate) ×3 IMPLANT
RUBBERBAND STERILE (MISCELLANEOUS) ×6 IMPLANT
SCREW ST 13X4XST VA NS SPNE (Screw) ×12 IMPLANT
SCREW ST VAR 4 ATL (Screw) ×6 IMPLANT
SPACER BONE CORNERSTONE 6X14 (Orthopedic Implant) ×6 IMPLANT
SPONGE GAUZE 4X4 12PLY (GAUZE/BANDAGES/DRESSINGS) ×3 IMPLANT
SPONGE INTESTINAL PEANUT (DISPOSABLE) ×3 IMPLANT
SPONGE SURGIFOAM ABS GEL SZ50 (HEMOSTASIS) ×3 IMPLANT
STOCKINETTE 4X48 STRL (DRAPES) ×3 IMPLANT
STRIP CLOSURE SKIN 1/2X4 (GAUZE/BANDAGES/DRESSINGS) ×3 IMPLANT
SUT ETHILON 3 0 FSL (SUTURE) ×3 IMPLANT
SUT PDS AB 5-0 P3 18 (SUTURE) ×3 IMPLANT
SUT VIC AB 3-0 SH 8-18 (SUTURE) ×6 IMPLANT
SYR 20ML ECCENTRIC (SYRINGE) ×3 IMPLANT
SYR BULB 3OZ (MISCELLANEOUS) ×3 IMPLANT
SYR CONTROL 10ML LL (SYRINGE) ×3 IMPLANT
TAPE CLOTH 4X10 WHT NS (GAUZE/BANDAGES/DRESSINGS) ×3 IMPLANT
TAPE CLOTH SURG 4X10 WHT LF (GAUZE/BANDAGES/DRESSINGS) ×3 IMPLANT
TOWEL OR 17X24 6PK STRL BLUE (TOWEL DISPOSABLE) ×6 IMPLANT
TOWEL OR 17X26 10 PK STRL BLUE (TOWEL DISPOSABLE) ×6 IMPLANT
TUBE CONNECTING 12X1/4 (SUCTIONS) ×3 IMPLANT
UNDERPAD 30X30 INCONTINENT (UNDERPADS AND DIAPERS) ×3 IMPLANT
WATER STERILE IRR 1000ML POUR (IV SOLUTION) ×6 IMPLANT

## 2012-07-20 NOTE — Anesthesia Procedure Notes (Signed)
Procedure Name: Intubation Date/Time: 07/20/2012 1:22 PM Performed by: Julianne Rice K Pre-anesthesia Checklist: Emergency Drugs available, Patient identified, Timeout performed, Suction available and Patient being monitored Patient Re-evaluated:Patient Re-evaluated prior to inductionOxygen Delivery Method: Circle system utilized Preoxygenation: Pre-oxygenation with 100% oxygen Intubation Type: IV induction Ventilation: Mask ventilation without difficulty Laryngoscope Size: Miller and 2 Grade View: Grade III Tube type: Oral Tube size: 7.5 mm Number of attempts: 1 Airway Equipment and Method: Stylet and LTA kit utilized Placement Confirmation: ETT inserted through vocal cords under direct vision,  breath sounds checked- equal and bilateral and positive ETCO2 Secured at: 21 cm Tube secured with: Tape Dental Injury: Teeth and Oropharynx as per pre-operative assessment

## 2012-07-20 NOTE — Anesthesia Preprocedure Evaluation (Signed)
Anesthesia Evaluation  Patient identified by MRN, date of birth, ID band Patient awake    Reviewed: Allergy & Precautions, H&P , NPO status   Airway Mallampati: II TM Distance: >3 FB Neck ROM: Limited    Dental   Pulmonary sleep apnea ,          Cardiovascular hypertension, Pt. on medications     Neuro/Psych    GI/Hepatic GERD-  Medicated and Controlled,  Endo/Other    Renal/GU      Musculoskeletal   Abdominal   Peds  Hematology   Anesthesia Other Findings   Reproductive/Obstetrics                           Anesthesia Physical Anesthesia Plan  ASA: III  Anesthesia Plan: General   Post-op Pain Management:    Induction: Intravenous  Airway Management Planned: Oral ETT  Additional Equipment:   Intra-op Plan:   Post-operative Plan: Extubation in OR  Informed Consent: I have reviewed the patients History and Physical, chart, labs and discussed the procedure including the risks, benefits and alternatives for the proposed anesthesia with the patient or authorized representative who has indicated his/her understanding and acceptance.     Plan Discussed with: CRNA and Surgeon  Anesthesia Plan Comments:         Anesthesia Quick Evaluation

## 2012-07-20 NOTE — OR Nursing (Signed)
Or procedure #1 ended at 1518. Start of procedure #2 at [facility]

## 2012-07-20 NOTE — Preoperative (Signed)
Beta Blockers   Reason not to administer Beta Blockers:Not Applicable 

## 2012-07-20 NOTE — H&P (Signed)
Jessica Watts is an 66 y.o. female.   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 She is in severe pain is failed all of his conservative management. She has no right-sided symptoms. She has no bowel or bladder per workup demonstrates evidence of significant spondylosis with spinal stenosis and spinal cord compression at C3-4 and C4-5. Patient also has evidence of spinal cord compression. She also has evidence of severe tenderness and reproduction of symptoms with palpation of her ulnar groove on the left side. She presents now for anterior cervical discectomy and fusion without after plating as well as ulnar nerve decompression at the elbow.  Past Medical History  Diagnosis Date  . Hypertension   . Asthma   . Sleep apnea     last sleep study 2000, not using CPAP  . Lupus   . Fibromyalgia   . GERD (gastroesophageal reflux disease)   . Headache     migraines  . Complication of anesthesia     has difficulties related to being Native American; multiple days in hospital after hysterectomy years ago    Past Surgical History  Procedure Date  . Cervical fusion     C2/3  . Tonsillectomy   . Abdominal hysterectomy   . Back surgery     lumbar fusion  . Shoulder surgery   . Carpal tunnel release     bilat  . Neuroma surgery     L foot    No family history on file. Social History:  reports that she has never smoked. She does not have any smokeless tobacco history on file. She reports that she does not drink alcohol or use illicit drugs.  Allergies:  Allergies  Allergen Reactions  . Sulfa Antibiotics Swelling    Had eye swelling and pain after using eye drops with sulfa component    Medications Prior to Admission  Medication Sig Dispense Refill  . cetirizine (ZYRTEC) 10 MG tablet Take 10  mg by mouth daily.      . diazepam (VALIUM) 10 MG tablet Take 10 mg by mouth every 8 (eight) hours as needed. For muscle spasms      . fluticasone (FLONASE) 50 MCG/ACT nasal spray Place 1 spray into the nose daily as needed. For nasal congestion      . HYDROcodone-acetaminophen (NORCO) 10-325 MG per tablet Take 1 tablet by mouth 4 (four) times daily as needed. For pain      . hydroxychloroquine (PLAQUENIL) 200 MG tablet Take 200 mg by mouth daily.      Marland Kitchen lisinopril-hydrochlorothiazide (PRINZIDE,ZESTORETIC) 20-12.5 MG per tablet Take 1 tablet by mouth daily.      Marland Kitchen omeprazole (PRILOSEC) 40 MG capsule Take 40 mg by mouth daily.      . traMADol (ULTRAM) 50 MG tablet Take 50 mg by mouth every 6 (six) hours as needed. For pain      . aspirin EC 81 MG tablet Take 81 mg by mouth daily.      . Calcium Carbonate-Vitamin D (CALCIUM 600/VITAMIN D) 600-400 MG-UNIT per tablet Take 2 tablets by mouth daily with breakfast.      . fish oil-omega-3 fatty acids 1000 MG capsule Take 1,000 mg by mouth daily.      . Multiple Vitamin (MULTIVITAMIN WITH MINERALS) TABS Take 1 tablet by mouth daily.      Marland Kitchen  naproxen (NAPROSYN) 500 MG tablet Take 500 mg by mouth 2 (two) times daily with a meal.      . vitamin E 400 UNIT capsule Take 400 Units by mouth daily.        Results for orders placed during the hospital encounter of 07/20/12 (from the past 48 hour(s))  TYPE AND SCREEN     Status: Normal   Collection Time   07/20/12 10:50 AM      Component Value Range Comment   ABO/RH(D) A POS      Antibody Screen NEG      Sample Expiration 07/23/2012     CBC WITH DIFFERENTIAL     Status: Normal   Collection Time   07/20/12 10:51 AM      Component Value Range Comment   WBC 8.3  4.0 - 10.5 K/uL    RBC 4.87  3.87 - 5.11 MIL/uL    Hemoglobin 14.7  12.0 - 15.0 g/dL    HCT 56.4  33.2 - 95.1 %    MCV 84.6  78.0 - 100.0 fL    MCH 30.2  26.0 - 34.0 pg    MCHC 35.7  30.0 - 36.0 g/dL    RDW 88.4  16.6 - 06.3 %    Platelets 202   150 - 400 K/uL    Neutrophils Relative 53  43 - 77 %    Neutro Abs 4.3  1.7 - 7.7 K/uL    Lymphocytes Relative 36  12 - 46 %    Lymphs Abs 3.0  0.7 - 4.0 K/uL    Monocytes Relative 7  3 - 12 %    Monocytes Absolute 0.6  0.1 - 1.0 K/uL    Eosinophils Relative 3  0 - 5 %    Eosinophils Absolute 0.2  0.0 - 0.7 K/uL    Basophils Relative 1  0 - 1 %    Basophils Absolute 0.1  0.0 - 0.1 K/uL    Dg Chest 2 View  07/20/2012  *RADIOLOGY REPORT*  Clinical Data: Preoperative exam for cervical discectomy.  History of asthma.  Nonsmoker.  CHEST - 2 VIEW  Comparison: 04/15/2007.  Findings: No infiltrate, congestive heart failure or pneumothorax.  Mild central pulmonary vascular prominence relatively stable. Slightly tortuous aorta.  Heart size within normal limits.  Prior cervical spine surgery.  IMPRESSION: No acute abnormality.  Since prior examination, aorta appears slightly more tortuous.   Original Report Authenticated By: Fuller Canada, M.D.     Review of Systems  Constitutional: Negative.   HENT: Negative.   Eyes: Negative.   Respiratory: Negative.   Cardiovascular: Negative.   Gastrointestinal: Negative.   Genitourinary: Negative.   Musculoskeletal: Negative.   Skin: Negative.   Neurological: Negative.   Endo/Heme/Allergies: Negative.   Psychiatric/Behavioral: Negative.     Blood pressure 108/69, pulse 68, temperature 98.1 F (36.7 C), temperature source Oral, resp. rate 20, SpO2 100.00%. Physical Exam  Constitutional: She is oriented to person, place, and time. She appears well-developed and well-nourished. No distress.  HENT:  Head: Normocephalic and atraumatic.  Right Ear: External ear normal.  Left Ear: External ear normal.  Nose: Nose normal.  Mouth/Throat: Oropharynx is clear and moist.  Eyes: Conjunctivae normal and EOM are normal. Pupils are equal, round, and reactive to light. Right eye exhibits no discharge. Left eye exhibits no discharge.  Neck: Normal range of  motion. Neck supple. No tracheal deviation present. No thyromegaly present.  Cardiovascular: Normal rate, normal heart sounds and  intact distal pulses.   Respiratory: Effort normal and breath sounds normal. No respiratory distress. She has no wheezes.  GI: Soft. Bowel sounds are normal. She exhibits no distension. There is no tenderness.  Musculoskeletal: Normal range of motion. She exhibits no edema and no tenderness.  Neurological: She is alert and oriented to person, place, and time. She has normal reflexes. She displays normal reflexes. No cranial nerve deficit. She exhibits normal muscle tone. Coordination normal.  Skin: Skin is warm and dry. No rash noted. She is not diaphoretic. No erythema. No pallor.  Psychiatric: She has a normal mood and affect. Her behavior is normal. Judgment and thought content normal.     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 Status post previous C5-6 and C6-7 anterior cervical discectomy and fusion. 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 Plan C3-4 C4-5 anterior cervical discectomy and fusion with allograft and her plating. Left ulnar nerve release at the elbow. Risks and benefits have been explained. Patient wishes to proceed.  Suzane Vanderweide A 07/20/2012, 12:45 PM

## 2012-07-20 NOTE — Transfer of Care (Signed)
Immediate Anesthesia Transfer of Care Note  Patient: Jessica Watts  Procedure(s) Performed: Procedure(s) (LRB) with comments: ANTERIOR CERVICAL DECOMPRESSION/DISCECTOMY FUSION 2 LEVELS (N/A) - cervical three- four , four -  five ULNAR NERVE DECOMPRESSION/TRANSPOSITION (Left)  Patient Location: PACU  Anesthesia Type: General  Level of Consciousness: awake, alert , oriented and patient cooperative  Airway & Oxygen Therapy: Patient Spontanous Breathing and Patient connected to face mask oxygen  Post-op Assessment: Report given to PACU RN, Post -op Vital signs reviewed and stable and Patient moving all extremities  Post vital signs: Reviewed and stable  Complications: No apparent anesthesia complications

## 2012-07-20 NOTE — Brief Op Note (Signed)
07/20/2012  3:19 PM  PATIENT:  Jessica Watts  66 y.o. female  PRE-OPERATIVE DIAGNOSIS:  stenosis/ulnar neuropathy  POST-OPERATIVE DIAGNOSIS:  cervical stenosis , ulnar neuropathy   PROCEDURE:  Procedure(s) (LRB) with comments: ANTERIOR CERVICAL DECOMPRESSION/DISCECTOMY FUSION 2 LEVELS (N/A) - cervical three- four , four -  five ULNAR NERVE DECOMPRESSION/TRANSPOSITION (Left)  SURGEON:  Surgeon(s) and Role:    * Temple Pacini, MD - Primary    * Karn Cassis, MD - Assisting  PHYSICIAN ASSISTANT:   ASSISTANTS:    ANESTHESIA:   general  EBL:  Total I/O In: 1000 [I.V.:1000] Out: 150 [Blood:150]  BLOOD ADMINISTERED:none  DRAINS: none   LOCAL MEDICATIONS USED:  NONE  SPECIMEN:  No Specimen  DISPOSITION OF SPECIMEN:  N/A  COUNTS:  YES  TOURNIQUET:  * No tourniquets in log *  DICTATION: .Dragon Dictation  PLAN OF CARE: Admit to inpatient   PATIENT DISPOSITION:  PACU - hemodynamically stable.   Delay start of Pharmacological VTE agent (>24hrs) due to surgical blood loss or risk of bleeding: yes

## 2012-07-20 NOTE — Anesthesia Postprocedure Evaluation (Signed)
Anesthesia Post Note  Patient: Jessica Watts  Procedure(s) Performed: Procedure(s) (LRB): ANTERIOR CERVICAL DECOMPRESSION/DISCECTOMY FUSION 2 LEVELS (N/A) ULNAR NERVE DECOMPRESSION/TRANSPOSITION (Left)  Anesthesia type: general  Patient location: PACU  Post pain: Pain level controlled  Post assessment: Patient's Cardiovascular Status Stable  Last Vitals:  Filed Vitals:   07/20/12 1615  BP: 138/66  Pulse: 74  Temp: 37.1 C  Resp: 11    Post vital signs: Reviewed and stable  Level of consciousness: sedated  Complications: No apparent anesthesia complications

## 2012-07-20 NOTE — Op Note (Addendum)
Date of procedure: 07/20/2012  Date of dictation: Same  Service: Neurosurgery  Preoperative diagnosis: C3-4, C4-5 stenosis with myelopathy. Status post C5-6 and C6-7 anterior cervical discectomy and fusion with allograft and anterior plating with anterior cervical plate overriding the C4-5 disc space and requiring removal for completion of the discectomy at C4-5.  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  C3-4 C4-5 anterior cervical discectomy and fusion with allograft and anterior plating  Left ulnar nerve decompression at the elbow    Surgeon:Gerron Guidotti A.Versie Fleener, M.D.  Asst. Surgeon: Jeral Fruit  Anesthesia: General  Indication: 66 year old female with severe neck and left upper trimming pain consistent with a mixed cervical radiculopathy with elements of cervical myelopathy as well as an entrapment neuropathy of her left ulnar nerve. Patient is status post previous C5-6 and C6-7 anterior cervical discectomy and fusion without after plating. Through remodeling of her anterior cervical spine the plate is now overriding the C4-5 disc space making access to the disc space and possible that removal of the plate. She has marked spondylosis at C3-4 and C4-5 with kyphosis and spinal cord compression. She has what appears to be radiographic solid fusion at C5-6 and C6-7.  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 She presents now for C3-4 and C4-5 anterior cervical setting fusion without after plating. Once again this will require removal for previously placed anterior cervical plate from U9-W1. We'll also perform a left-sided ulnar nerve decompression at the elbow.  Operative note: After induction of anesthesia, patient positioned supine with Extended and held in place of  halter traction. Patient's anterior service region prepped and draped sterilely. 10 blade used to make a linear skin incision overlying her mid cervical spine. This carried down sharply to the platysma. Is then divided vertically and dissection to the medial border of the cervical os muscle and carotid sheath. Trachea and esophagus are mobilized track towards the left. Prevertebral fascia stripped off the anterior spinal column. Longus colli muscles elevated bilaterally. Deep self transfers placed. Previously placed C5 to C7 anterior cervical plate was dissected free and removed. Fusions at C5-6 and C6-7 were inspected and found to be quite solid. Disc space at C3-4 and C4-5 and incised 15 blade are tender fracture when the space then achieved using pituitary rongeurs for tobacco progress Kerrison rongeurs and a high-speed drill. Almost the disc removed down to level posterior annulus. Marked and brought field these at the remainder of the discectomy remaining aspects of annulus ossifies removed using high-speed drill down 12 posterior lateral and posterior lateral elevated and resected so fashion using Kerrison rongeurs. On thecal sac was identified. Y. center decompression and perform an in and undercutting the bodies of C3 and C4. Decompression proceeded chauffeuring provider foraminotomies and perform of course exiting C4 nerve roots bilaterally. At this point a very thorough depression and she appears of injury to thecal sac and nerve roots. Procedure then repeated at C4-5 again without complication. 6 mm cornerstone allograft wedges and packed in place recessed off the 1 mm and the intervertebral margin at C3-4 and C4-5. 40 mm Atlantis anterior cervical plate was in place at the C3-4 and 5 levels. This infection or fluoroscopic guidance using 13 mm variable-angle screws 2 each in all 3 levels. All 6 screws given a final tightening be solidly within bone.  Locking screws and gauge at all 3 levels. Final images  real good position the varus or proper upper level normal spine. Wounds that area out solution. Gelfoam was placed topically for hemostasis. Wounds and closed in typical fashion. Steri-Strips and sterile dressing were applied. There were no apparent locations. Patient tolerated the procedure well and returned to the recovery room postop  Left upper trimming was prepped and draped. Curvilinear incision was made along the medial epicondyle on the left. Dissection was then performed exposing the proximal ulnar nerve. Ulnar was then decompressed through its course through the ulnar groove and as it passes underneath the aponeurosis the flexor carpi ulnaris. The flexor carpus all nerves aponeurosis was divided as well fully decompressing the ulnar nerve. Wound is then irrigated and closed in typical fashion. No apparent outpatient with this aspect of the procedure. Once again patient returns to the recovery room postop.

## 2012-07-20 NOTE — Plan of Care (Signed)
Problem: Diagnosis - Type of Surgery Goal: General Surgical Patient Education (See Patient Education module for education specifics) Outcome: Completed/Met Date Met:  07/20/12 Anterior cervical fusion

## 2012-07-21 MED ORDER — HYDROCODONE-ACETAMINOPHEN 10-325 MG PO TABS: 1.0000 | ORAL_TABLET | Freq: Four times a day (QID) | ORAL | Status: DC | PRN

## 2012-07-21 MED ORDER — CYCLOBENZAPRINE HCL 10 MG PO TABS: 10.0000 mg | ORAL_TABLET | Freq: Three times a day (TID) | ORAL | Status: DC | PRN

## 2012-07-21 MED ORDER — GABAPENTIN 300 MG PO CAPS: 300.0000 mg | ORAL_CAPSULE | Freq: Three times a day (TID) | ORAL | Status: DC

## 2012-07-21 NOTE — Progress Notes (Signed)
UR COMPLETED  

## 2012-07-21 NOTE — Progress Notes (Signed)
Pt d/c to home by car with family. Assessment stable and prescriptions given. Able to verbalize understanding of d/c instructions.

## 2012-07-21 NOTE — Discharge Summary (Signed)
Physician Discharge Summary  Patient ID: Jessica Watts MRN: [id number] DOB/AGE: 1946-08-19 66 y.o.  Admit date: 07/20/2012 Discharge date: 07/21/2012  Admission Diagnoses:  Discharge Diagnoses:  Principal Problem:  *Cervical radiculopathy Active Problems:  Ulnar neuropathy at elbow   Discharged Condition: good  Hospital Course: Patient admitted to the hospital where she underwent uncomplicated C3-4 and C4-5 anterior cervical discectomy and fusion with allograft and her plating. She also underwent a left-sided ulnar nerve release. Postoperative she is done well. Her neck and upper extremity pain are much improved. Her strength station are stable. Her wound is healing well. Plan is for discharge home.  Consults:   Significant Diagnostic Studies:   Treatments:   Discharge Exam: Blood pressure 107/45, pulse 88, temperature 98.3 F (36.8 C), temperature source Oral, resp. rate 18, height 5\' 1"  (1.549 m), weight 89.812 kg (198 lb), SpO2 98.00%. Patient awake and alert she is oriented and appropriate her cranial nerve function is intact. Motor examination is intact aside from some slight weakness in her left-sided intrinsics. Wounds clean dry and intact. Chest abdomen benign.  Disposition:      Medication List     As of 07/21/2012  7:57 AM    TAKE these medications         aspirin EC 81 MG tablet   Take 81 mg by mouth daily.      CALCIUM 600/VITAMIN D 600-400 MG-UNIT per tablet   Generic drug: Calcium Carbonate-Vitamin D   Take 2 tablets by mouth daily with breakfast.      cetirizine 10 MG tablet   Commonly known as: ZYRTEC   Take 10 mg by mouth daily.      cyclobenzaprine 10 MG tablet   Commonly known as: FLEXERIL   Take 1 tablet (10 mg total) by mouth every 8 (eight) hours as needed for muscle spasms.      diazepam 10 MG tablet   Commonly known as: VALIUM   Take 10 mg by mouth every 8 (eight) hours as needed. For muscle spasms      fish oil-omega-3 fatty acids  1000 MG capsule   Take 1,000 mg by mouth daily.      fluticasone 50 MCG/ACT nasal spray   Commonly known as: FLONASE   Place 1 spray into the nose daily as needed. For nasal congestion      gabapentin 300 MG capsule   Commonly known as: NEURONTIN   Take 1 capsule (300 mg total) by mouth 3 (three) times daily.      HYDROcodone-acetaminophen 10-325 MG per tablet   Commonly known as: NORCO   Take 1-2 tablets by mouth 4 (four) times daily as needed. For pain      hydroxychloroquine 200 MG tablet   Commonly known as: PLAQUENIL   Take 200 mg by mouth daily.      lisinopril-hydrochlorothiazide 20-12.5 MG per tablet   Commonly known as: PRINZIDE,ZESTORETIC   Take 1 tablet by mouth daily.      multivitamin with minerals Tabs   Take 1 tablet by mouth daily.      naproxen 500 MG tablet   Commonly known as: NAPROSYN   Take 500 mg by mouth 2 (two) times daily with a meal.      omeprazole 40 MG capsule   Commonly known as: PRILOSEC   Take 40 mg by mouth daily.      traMADol 50 MG tablet   Commonly known as: ULTRAM   Take 50 mg by mouth every 6 (  six) hours as needed. For pain      vitamin E 400 UNIT capsule   Take 400 Units by mouth daily.           Follow-up Information    Follow up with Lymon Kidney A, MD. Call in 1 week.   Contact information:   [address] [place] [address] [phone]          Signed: Jolon Degante A 07/21/2012, 7:57 AM

## 2012-07-21 NOTE — Progress Notes (Signed)
Orthopedic Tech Progress Note Patient Details:  Jessica Watts Oct 01, 1946 [id number]  Patient ID: Jessica Watts, female   DOB: 1946-07-12, 66 y.o.   MRN: [id number]   Jessica Watts 07/21/2012, 8:04 AM SOFT COLLAR COMPLETED BY [employer]

## 2012-07-23 ENCOUNTER — Encounter (HOSPITAL_COMMUNITY): Payer: Self-pay | Admitting: Neurosurgery

## 2012-08-18 ENCOUNTER — Other Ambulatory Visit: Payer: Self-pay | Admitting: Neurosurgery

## 2013-01-21 ENCOUNTER — Other Ambulatory Visit: Payer: Self-pay | Admitting: Gastroenterology

## 2013-05-10 ENCOUNTER — Other Ambulatory Visit: Payer: Self-pay

## 2013-05-10 DIAGNOSIS — Z1231 Encounter for screening mammogram for malignant neoplasm of breast: Secondary | ICD-10-CM

## 2013-05-24 ENCOUNTER — Ambulatory Visit
Admission: RE | Admit: 2013-05-24 | Discharge: 2013-05-24 | Disposition: A | Discharge status: Home / Self Care | Payer: Medicare Other | Source: Ambulatory Visit

## 2013-05-24 DIAGNOSIS — Z1231 Encounter for screening mammogram for malignant neoplasm of breast: Secondary | ICD-10-CM

## 2013-08-29 DIAGNOSIS — H269 Unspecified cataract: Secondary | ICD-10-CM | POA: Insufficient documentation

## 2013-09-26 DIAGNOSIS — H269 Unspecified cataract: Secondary | ICD-10-CM | POA: Insufficient documentation

## 2014-03-01 ENCOUNTER — Encounter: Payer: Self-pay | Admitting: Podiatrist

## 2014-03-01 ENCOUNTER — Ambulatory Visit (INDEPENDENT_AMBULATORY_CARE_PROVIDER_SITE_OTHER): Payer: Medicare Other | Admitting: Podiatrist

## 2014-03-01 VITALS — BP 147/80 | HR 80 | Resp 12

## 2014-03-01 DIAGNOSIS — M84376A Stress fracture, unspecified foot, initial encounter for fracture: Secondary | ICD-10-CM

## 2014-03-01 DIAGNOSIS — M779 Enthesopathy, unspecified: Secondary | ICD-10-CM

## 2014-03-01 DIAGNOSIS — M8430XA Stress fracture, unspecified site, initial encounter for fracture: Secondary | ICD-10-CM

## 2014-03-01 NOTE — Progress Notes (Signed)
   Subjective:    Patient ID: Jessica Watts, female    DOB: September 29, 1946, 68 y.o.   MRN: [id number]  HPI PT STATED LT TOP AND SIDE OF THE FOOT/ANKLE IS SWOLLEN AND HURTING FOR 6 MONTHS. THE FOOT IS GETTING WORSE. THE GET AGGRAVATED BY WALKING AND SITTING. TRIED SOAK WITH EPSON SALT AND OTC CREAM BUT IT DOES NOT HELP.  She has been also been diagnosed with lupus.    She brings in recent xrays for review.     Review of Systems  HENT: Positive for sore throat.   Eyes: Positive for pain, discharge, redness and itching.  Respiratory: Positive for cough and wheezing.   Neurological: Positive for numbness.  All other systems reviewed and are negative.      Objective:   Physical Exam Patient is awake, alert, and oriented x 3.  In no acute distress.  Vascular status is intact with palpable pedal pulses at 2/4 DP and PT bilateral and capillary refill time within normal limits. Neurological sensation is also intact bilaterally via Semmes Weinstein monofilament at 5/5 sites. Light touch, vibratory sensation, Achilles tendon reflex is intact. Dermatological exam reveals skin color, turger and texture as normal. No open lesions present.  Musculature intact with dorsiflexion, plantarflexion, inversion, eversion.  Pain along the lateral side of the left foot is present.  Discomfort on the dorsal lateral side of the cuboid is present.  xrays also show an area of focal increased area of density on the lateral side of the foot.  Area of subjective pain and xray findings are similar.    Assessment & Plan:  Foot sprain,  Radiodense lesion lateral foot, versus arthritis vs.  stress fracture  Plan:  Recommended  An air fracture walker for the left foot to see if it helps the discomfort.  I will see her back for follow up in 3 weeks.  If there is no improvement, an mri will be ordered.

## 2014-03-22 ENCOUNTER — Encounter: Payer: Self-pay | Admitting: Podiatrist

## 2014-03-22 ENCOUNTER — Ambulatory Visit (INDEPENDENT_AMBULATORY_CARE_PROVIDER_SITE_OTHER): Payer: Medicare Other | Admitting: Podiatrist

## 2014-03-22 VITALS — BP 114/86 | HR 78 | Resp 12

## 2014-03-22 DIAGNOSIS — IMO0002 Reserved for concepts with insufficient information to code with codable children: Secondary | ICD-10-CM

## 2014-03-22 NOTE — Progress Notes (Signed)
  Patient presents for follow up of left foot and ankle pain. She has been wearing the boot as instructed.  She still has anterior ankle pain at the anterior talofibular ligament area.  She states there has been minimal improvement since wearing the boot.    Objective: neurovascular status unchanged.  Continued pain along the anterior lateral aspect of the left ankle is present. Concern for a tear is noted.    Assessment:  Ankle ligament tear   Plan: mri will be ordered.  Will notify her of the result and treatment plan.  Will continue to wear the boot

## 2014-03-24 ENCOUNTER — Telehealth: Payer: Self-pay | Admitting: *Deleted

## 2014-03-24 ENCOUNTER — Ambulatory Visit
Admission: RE | Admit: 2014-03-24 | Discharge: 2014-03-24 | Disposition: A | Discharge status: Home / Self Care | Payer: Medicare Other | Source: Ambulatory Visit | Attending: Podiatrist | Admitting: Podiatrist

## 2014-03-24 DIAGNOSIS — IMO0002 Reserved for concepts with insufficient information to code with codable children: Secondary | ICD-10-CM

## 2014-03-24 NOTE — Telephone Encounter (Signed)
Patient's MRI needed authorization.  I contacted [facility] and answered their questions.  MRI was approved.  Valid for 45 days, expires 05/02/2014.  Authorization number is [id number].

## 2014-03-24 NOTE — Telephone Encounter (Signed)
We need authorization for her MRI without contrast, [phone] is the code.  MRI is scheduled for today at 2pm.  NPI is [id number].

## 2014-03-27 ENCOUNTER — Telehealth: Payer: Self-pay | Admitting: *Deleted

## 2014-03-27 NOTE — Telephone Encounter (Signed)
Message copied by Lolita Rieger on Mon Mar 27, 2014  9:07 AM ------      Message from: Bronson Ing      Created: Fri Mar 24, 2014  5:03 PM      Regarding: mri       Let her know nothing is torn but she does have some fluid in the tendons== an injection would help if she is intereseted.            If so have her in to see me next week            thanks      ----- Message -----         From: Rad Results In Interface         Sent: 03/24/2014   4:24 PM           To: Trudie Buckler, DPM                   ------

## 2014-03-27 NOTE — Telephone Encounter (Signed)
I called and left her a message that Dr. Valentina Lucks said the MRI results didn't reveal anything torn but there is fluid in the tendons.  She stated she can give you an injection if you're interested to help.  Please give [place] a call to schedule an appointment.

## 2014-04-05 ENCOUNTER — Telehealth: Payer: Self-pay | Admitting: *Deleted

## 2014-04-05 NOTE — Telephone Encounter (Signed)
I called and informed the patient that the MRI did not show a tear per Dr. Valentina Lucks.  I told he she said it just showed inflammation and she said she can give you a cortisone injection in that area or arrange physical therapy.  She asked when we could get her in for an appointment.  I told her I would transfer her to a scheduler.

## 2014-04-05 NOTE — Telephone Encounter (Signed)
Mri showed no sign of tear -- just a little inflammation.  I can give her a cortisone injection in the area of inflammation if she would like-- or physical therapy is another option as well

## 2014-04-05 NOTE — Telephone Encounter (Signed)
Need to find out about the MRI that was done week before last.  I been out of town.

## 2014-04-12 ENCOUNTER — Ambulatory Visit (INDEPENDENT_AMBULATORY_CARE_PROVIDER_SITE_OTHER): Payer: Medicare Other | Admitting: Podiatrist

## 2014-04-12 ENCOUNTER — Encounter: Payer: Self-pay | Admitting: Podiatrist

## 2014-04-12 VITALS — BP 168/95 | HR 80 | Resp 17 | Ht 60.25 in | Wt 211.0 lb

## 2014-04-12 DIAGNOSIS — M7750 Other enthesopathy of unspecified foot: Secondary | ICD-10-CM

## 2014-04-12 DIAGNOSIS — M7752 Other enthesopathy of left foot: Secondary | ICD-10-CM

## 2014-04-12 MED ORDER — TRIAMCINOLONE ACETONIDE 10 MG/ML IJ SUSP
10.0000 mg | Freq: Once | INTRAMUSCULAR | Status: AC
  Administered 2014-04-12: 10 mg

## 2014-04-12 NOTE — Progress Notes (Signed)
   Subjective:    Patient ID: Jessica Watts, female    DOB: [birth year], 68 y.o.   MRN: [id number]  HPI Comments: [place] and has been up on her feet more.  Pt states she has been in the boot, but took it off in the car for convenience.     Review of Systems     Objective:   Physical Exam Neurovascular status is intact left. She has pinpoint pain along the sinus tarsi region and anterior talofibular ligament of the left ankle. MRI was negative for an acute tear. It did show some synovitis within the peroneus brevis and longus tendons. The patient has no pain in this region at today's visit.     Assessment & Plan:  Assessment: Sinus tarsi syndrome/capsuliltis left ankle--  Plan: A sterile injection with Kenalog and Marcaine mixture was delivered into the sinus tarsi of the left ankle. Recommended a supportive ankle brace. She will be seen back in 2 weeks for recheck.

## 2014-04-12 NOTE — Patient Instructions (Signed)
Purchase an adjustable wrap around ankle brace-- they are around $8 at [employer] or drug stores.  You want the adjustable kind so you may decide on how much pressure you need on your ankle.

## 2015-04-30 ENCOUNTER — Other Ambulatory Visit: Payer: Self-pay | Admitting: Orthopaedic Surgery

## 2015-04-30 DIAGNOSIS — M25562 Pain in left knee: Secondary | ICD-10-CM

## 2015-04-30 DIAGNOSIS — M545 Low back pain: Secondary | ICD-10-CM

## 2015-05-09 ENCOUNTER — Ambulatory Visit
Admission: RE | Admit: 2015-05-09 | Discharge: 2015-05-09 | Disposition: A | Discharge status: Home / Self Care | Payer: Medicare Other | Source: Ambulatory Visit | Attending: Orthopaedic Surgery | Admitting: Orthopaedic Surgery

## 2015-05-09 DIAGNOSIS — M25562 Pain in left knee: Secondary | ICD-10-CM

## 2015-05-09 DIAGNOSIS — M545 Low back pain: Secondary | ICD-10-CM

## 2015-07-10 ENCOUNTER — Other Ambulatory Visit: Payer: Self-pay

## 2015-07-10 DIAGNOSIS — Z1231 Encounter for screening mammogram for malignant neoplasm of breast: Secondary | ICD-10-CM

## 2015-07-16 ENCOUNTER — Ambulatory Visit
Admission: RE | Admit: 2015-07-16 | Discharge: 2015-07-16 | Disposition: A | Discharge status: Home / Self Care | Payer: PPO | Source: Ambulatory Visit

## 2015-07-16 DIAGNOSIS — G4730 Sleep apnea, unspecified: Secondary | ICD-10-CM | POA: Insufficient documentation

## 2015-07-16 DIAGNOSIS — Z1231 Encounter for screening mammogram for malignant neoplasm of breast: Secondary | ICD-10-CM

## 2015-08-22 DIAGNOSIS — R000 Tachycardia, unspecified: Secondary | ICD-10-CM | POA: Insufficient documentation

## 2015-08-22 DIAGNOSIS — K219 Gastro-esophageal reflux disease without esophagitis: Secondary | ICD-10-CM | POA: Insufficient documentation

## 2015-08-22 DIAGNOSIS — M1712 Unilateral primary osteoarthritis, left knee: Secondary | ICD-10-CM | POA: Insufficient documentation

## 2015-08-22 DIAGNOSIS — J309 Allergic rhinitis, unspecified: Secondary | ICD-10-CM | POA: Insufficient documentation

## 2015-08-22 DIAGNOSIS — M349 Systemic sclerosis, unspecified: Secondary | ICD-10-CM | POA: Insufficient documentation

## 2015-08-22 DIAGNOSIS — I10 Essential (primary) hypertension: Secondary | ICD-10-CM | POA: Insufficient documentation

## 2015-08-22 DIAGNOSIS — G609 Hereditary and idiopathic neuropathy, unspecified: Secondary | ICD-10-CM | POA: Insufficient documentation

## 2015-08-22 DIAGNOSIS — G43109 Migraine with aura, not intractable, without status migrainosus: Secondary | ICD-10-CM | POA: Insufficient documentation

## 2015-08-22 DIAGNOSIS — R7303 Prediabetes: Secondary | ICD-10-CM | POA: Insufficient documentation

## 2015-08-22 DIAGNOSIS — E7800 Pure hypercholesterolemia, unspecified: Secondary | ICD-10-CM | POA: Insufficient documentation

## 2015-08-22 DIAGNOSIS — R32 Unspecified urinary incontinence: Secondary | ICD-10-CM | POA: Insufficient documentation

## 2015-08-22 DIAGNOSIS — D649 Anemia, unspecified: Secondary | ICD-10-CM | POA: Insufficient documentation

## 2015-08-23 ENCOUNTER — Ambulatory Visit (INDEPENDENT_AMBULATORY_CARE_PROVIDER_SITE_OTHER): Payer: PPO | Admitting: Cardiology

## 2015-08-23 VITALS — BP 138/98 | HR 83 | Ht 60.0 in | Wt 213.8 lb

## 2015-08-23 DIAGNOSIS — R0602 Shortness of breath: Secondary | ICD-10-CM

## 2015-08-23 DIAGNOSIS — G4730 Sleep apnea, unspecified: Secondary | ICD-10-CM

## 2015-08-23 DIAGNOSIS — I10 Essential (primary) hypertension: Secondary | ICD-10-CM | POA: Diagnosis not present

## 2015-08-23 DIAGNOSIS — R002 Palpitations: Secondary | ICD-10-CM

## 2015-08-23 DIAGNOSIS — R079 Chest pain, unspecified: Secondary | ICD-10-CM | POA: Diagnosis not present

## 2015-08-23 NOTE — Patient Instructions (Signed)
Medication Instructions:  Your physician recommends that you continue on your current medications as directed. Please refer to the Current Medication list given to you today.   Labwork: None  Testing/Procedures: Your physician has requested that you have an echocardiogram. Echocardiography is a painless test that uses sound waves to create images of your heart. It provides your doctor with information about the size and shape of your heart and how well your heart's chambers and valves are working. This procedure takes approximately one hour. There are no restrictions for this procedure.  Your physician has recommended that you wear an event monitor. Event monitors are medical devices that record the heart's electrical activity. Doctors most often [place] these monitors to diagnose arrhythmias. Arrhythmias are problems with the speed or rhythm of the heartbeat. The monitor is a small, portable device. You can wear one while you do your normal daily activities. This is usually used to diagnose what is causing palpitations/syncope (passing out).  Your physician has recommended that you have a sleep study. This test records several body functions during sleep, including: brain activity, eye movement, oxygen and carbon dioxide blood levels, heart rate and rhythm, breathing rate and rhythm, the flow of air through your mouth and nose, snoring, body muscle movements, and chest and belly movement.  Dr. Radford Pax recommends you have a STRESS MYOVIEW.  Follow-Up: Your physician recommends that you schedule a follow-up appointment AS NEEDED with Dr. Radford Pax pending your study results.  Any Other Special Instructions Will Be Listed Below (If Applicable).     If you need a refill on your cardiac medications before your next appointment, please call your pharmacy.

## 2015-08-23 NOTE — Progress Notes (Signed)
Cardiology Office Note   Date:  08/23/2015   ID:  Jessica Watts, DOB December 17, 1945, MRN [id number]  PCP:  Osborne Casco, MD    Chief Complaint  Patient presents with  . Shortness of Breath  . Tachycardia      History of Present Illness: Jessica Watts is a 69 y.o. female who presents for evaluation of SOB and palpitations.  She recently had surgery for arthroscopic knee surgery and was kept overnight for sleep apnea.  She was also noted to have some tachycardia and wide fluctuations in her  BP during the surgery.  She has a history of OSA in the past but has not been on CPAP.  She is a poor historian so difficult to get an accurate history.  She has had some intermittent chest discomfort over the past few months.  She describes it as a squeezing sensation that has happened 3 times lasting a few minutes and then resolves on its own.  It only occurs at night.  There is associated diaphoresis but no nausea.  She has had some problems with SOB that only occurs at night.  She has had palpitations but they only occur at night and she can see her heart beating fast. She says the CP, SOB and palpitations only occur at night and never with exertion.   She occasionally has some LE edema.      Past Medical History  Diagnosis Date  . Hypertension   . Asthma   . Sleep apnea     last sleep study 2000, not using CPAP  . Lupus ([place])   . Fibromyalgia   . GERD (gastroesophageal reflux disease)   . Headache(784.0)     migraines  . Complication of anesthesia     has difficulties related to being Native American; multiple days in hospital after hysterectomy years ago  . Migraine   . Peptic ulcer disease   . Hyperlipidemia     Past Surgical History  Procedure Laterality Date  . Cervical fusion      C2/3  . Tonsillectomy    . Abdominal hysterectomy    . Back surgery      lumbar fusion  . Shoulder surgery    . Carpal tunnel release      bilat  . Neuroma  surgery      L foot  . Anterior cervical decomp/discectomy fusion  07/20/2012    Procedure: ANTERIOR CERVICAL DECOMPRESSION/DISCECTOMY FUSION 2 LEVELS;  Surgeon: Charlie Pitter, MD;  Location: [facility];  Service: Neurosurgery;  Laterality: N/A;  cervical three- four , four -  five  . Ulnar nerve transposition  07/20/2012    Procedure: ULNAR NERVE DECOMPRESSION/TRANSPOSITION;  Surgeon: Charlie Pitter, MD;  Location: [facility];  Service: Neurosurgery;  Laterality: Left;     Current Outpatient Prescriptions  Medication Sig Dispense Refill  . albuterol (PROAIR HFA) 108 (90 BASE) MCG/ACT inhaler Inhale 2 puffs into the lungs every 4 (four) hours as needed.    Marland Kitchen aspirin EC 81 MG tablet Take 81 mg by mouth daily.    . Calcium Carbonate-Vitamin D (CALCIUM 600/VITAMIN D) 600-400 MG-UNIT per tablet Take 2 tablets by mouth daily with breakfast.    . fish oil-omega-3 fatty acids 1000 MG capsule Take 1,000 mg by mouth daily.    . fluticasone (FLONASE) 50 MCG/ACT nasal spray Place 1 spray into the nose daily as  needed. For nasal congestion    . gabapentin (NEURONTIN) 300 MG capsule Take 1 capsule (300 mg total) by mouth 3 (three) times daily. 90 capsule 1  . GuaiFENesin (MUCINEX PO) Take 1 tablet by mouth every 12 (twelve) hours.    . hydroxychloroquine (PLAQUENIL) 200 MG tablet Take 1 tablet by mouth daily.    Marland Kitchen losartan-hydrochlorothiazide (HYZAAR) 100-12.5 MG tablet Take 1 tablet by mouth daily.    . mometasone (NASONEX) 50 MCG/ACT nasal spray Place 2 sprays into both nostrils daily.    . Multiple Vitamin (MULTIVITAMIN WITH MINERALS) TABS Take 1 tablet by mouth daily.    Marland Kitchen omeprazole (PRILOSEC) 40 MG capsule Take 40 mg by mouth daily.    . vitamin E 400 UNIT capsule Take 1 capsule by mouth daily.     No current facility-administered medications for this visit.    Allergies:   Ace inhibitors; Amoxicillin; and Sulfa antibiotics    Social History:  The patient  reports that she has never smoked.  She does not have any smokeless tobacco history on file. She reports that she does not drink alcohol or use illicit drugs.   Family History:  The patient's family history is not on file.    ROS:  Please see the history of present illness.   Otherwise, review of systems are positive for none.   All other systems are reviewed and negative.    PHYSICAL EXAM: VS:  BP 138/98 mmHg  Pulse 83  Ht 5' (1.524 m)  Wt 213 lb 12.8 oz (96.979 kg)  BMI 41.75 kg/m2 , BMI Body mass index is 41.75 kg/(m^2). GEN: Well nourished, well developed, in no acute distress HEENT: normal Neck: no JVD, carotid bruits, or masses Cardiac: RRR; no murmurs, rubs, or gallops,no edema  Respiratory:  clear to auscultation bilaterally, normal work of breathing GI: soft, nontender, nondistended, + BS MS: no deformity or atrophy Skin: warm and dry, no rash Neuro:  Strength and sensation are intact Psych: euthymic mood, full affect   EKG:  EKG is ordered today. The ekg ordered today demonstrates NSR with anterior infarct and no ST changes   Recent Labs: No results found for requested labs within last 365 days.    Lipid Panel No results found for: CHOL, TRIG, HDL, CHOLHDL, VLDL, LDLCALC, LDLDIRECT    Wt Readings from Last 3 Encounters:  08/23/15 213 lb 12.8 oz (96.979 kg)  04/12/14 211 lb (95.709 kg)  07/20/12 198 lb (89.812 kg)        ASSESSMENT AND PLAN:  1.  Chest pain with typical and atypical components.  It is nonexertional and only occurs at night raising the question of possible GERD.  It is associated with diaphoresis and SOB.  I will get a stress myoview to rule out ischemia.   2.  SOB - ? Related to underlying OSA since it only occurs at night.  Could also be GERD. Will check a 2D echo to assess LVF. 3.  Palpitations - will get a 30 day event monitor to assess for PAF 4.  OSA - she had been on CPAP remotely but not now.  She was recently kept overnight due to sleep apnea after orthopedic  surgery.  Will set up 30 day heart monitor.    Current medicines are reviewed at length with the patient today.  The patient does not have concerns regarding medicines.  The following changes have been made:  no change  Labs/ tests ordered today: See above Assessment and Plan  No orders of the defined types were placed in this encounter.     Disposition:   FU with me PRN pending results of studies Signed, Sueanne Margarita, MD  08/23/2015 2:19 PM    [facility] [place], [place], [place]  [address] Phone: [phone]; Fax: [phone]

## 2015-09-03 ENCOUNTER — Telehealth (HOSPITAL_COMMUNITY): Payer: Self-pay | Admitting: *Deleted

## 2015-09-03 NOTE — Telephone Encounter (Signed)
Patient given detailed instructions per Myocardial Perfusion Study Information Sheet for the test on 09/05/15 at 1000. Patient notified to arrive 15 minutes early and that it is imperative to arrive on time for appointment to keep from having the test rescheduled.  If you need to cancel or reschedule your appointment, please call the office within 24 hours of your appointment. Failure to do so may result in a cancellation of your appointment, and a $50 no show fee. Patient verbalized understanding.Jaylnn Ullery, Ranae Palms

## 2015-09-05 ENCOUNTER — Ambulatory Visit (HOSPITAL_BASED_OUTPATIENT_CLINIC_OR_DEPARTMENT_OTHER): Payer: PPO

## 2015-09-05 ENCOUNTER — Other Ambulatory Visit: Payer: Self-pay

## 2015-09-05 ENCOUNTER — Ambulatory Visit (HOSPITAL_COMMUNITY): Payer: PPO | Attending: Cardiology

## 2015-09-05 ENCOUNTER — Ambulatory Visit (INDEPENDENT_AMBULATORY_CARE_PROVIDER_SITE_OTHER): Payer: PPO

## 2015-09-05 DIAGNOSIS — I10 Essential (primary) hypertension: Secondary | ICD-10-CM | POA: Diagnosis not present

## 2015-09-05 DIAGNOSIS — R9439 Abnormal result of other cardiovascular function study: Secondary | ICD-10-CM | POA: Insufficient documentation

## 2015-09-05 DIAGNOSIS — I340 Nonrheumatic mitral (valve) insufficiency: Secondary | ICD-10-CM | POA: Insufficient documentation

## 2015-09-05 DIAGNOSIS — R079 Chest pain, unspecified: Secondary | ICD-10-CM | POA: Diagnosis present

## 2015-09-05 DIAGNOSIS — R002 Palpitations: Secondary | ICD-10-CM | POA: Diagnosis not present

## 2015-09-05 DIAGNOSIS — R0602 Shortness of breath: Secondary | ICD-10-CM

## 2015-09-05 LAB — MYOCARDIAL PERFUSION IMAGING
CHL CUP NUCLEAR SDS: 7
CHL CUP RESTING HR STRESS: 78 {beats}/min
CSEPPHR: 91 {beats}/min
LHR: 0.3
LVDIAVOL: 70 mL
LVSYSVOL: 33 mL
NUC STRESS TID: 0.95
SRS: 1
SSS: 8

## 2015-09-05 MED ORDER — REGADENOSON 0.4 MG/5ML IV SOLN
0.4000 mg | Freq: Once | INTRAVENOUS | Status: AC
Start: 2015-09-05 — End: 2015-09-05
  Administered 2015-09-05: 0.4 mg via INTRAVENOUS

## 2015-09-05 MED ORDER — TECHNETIUM TC 99M SESTAMIBI GENERIC - CARDIOLITE
32.8000 | Freq: Once | INTRAVENOUS | Status: AC | PRN
  Administered 2015-09-05: 32.8 via INTRAVENOUS

## 2015-09-05 MED ORDER — TECHNETIUM TC 99M SESTAMIBI GENERIC - CARDIOLITE
10.7000 | Freq: Once | INTRAVENOUS | Status: AC | PRN
  Administered 2015-09-05: 11 via INTRAVENOUS

## 2015-09-10 ENCOUNTER — Telehealth: Payer: Self-pay

## 2015-09-10 ENCOUNTER — Other Ambulatory Visit: Payer: Self-pay | Admitting: Cardiology

## 2015-09-10 DIAGNOSIS — Z01812 Encounter for preprocedural laboratory examination: Secondary | ICD-10-CM

## 2015-09-10 DIAGNOSIS — R9439 Abnormal result of other cardiovascular function study: Secondary | ICD-10-CM

## 2015-09-10 NOTE — Telephone Encounter (Signed)
-----   Message from Sueanne Margarita, MD sent at 09/06/2015  8:11 AM EST ----- Abnormal stress test - please set up for left heart cath for week of 11/28

## 2015-09-10 NOTE — Telephone Encounter (Signed)
Informed patient of results and verbal understanding expressed.  Scheduled for L heart catheterization 09/14/15 with Dr. Burt Knack. Patient to come in 11/29 for pre-procedure lab work and to pick up instruction letter.

## 2015-09-11 ENCOUNTER — Other Ambulatory Visit (INDEPENDENT_AMBULATORY_CARE_PROVIDER_SITE_OTHER): Payer: PPO

## 2015-09-11 DIAGNOSIS — Z01812 Encounter for preprocedural laboratory examination: Secondary | ICD-10-CM

## 2015-09-11 DIAGNOSIS — R931 Abnormal findings on diagnostic imaging of heart and coronary circulation: Secondary | ICD-10-CM | POA: Diagnosis not present

## 2015-09-11 DIAGNOSIS — R9439 Abnormal result of other cardiovascular function study: Secondary | ICD-10-CM

## 2015-09-11 LAB — BASIC METABOLIC PANEL WITH GFR
BUN: 11 mg/dL (ref 7–25)
CO2: 28 mmol/L (ref 20–31)
Calcium: 9.1 mg/dL (ref 8.6–10.4)
Chloride: 100 mmol/L (ref 98–110)
Creat: 0.71 mg/dL (ref 0.50–0.99)
Glucose, Bld: 141 mg/dL — ABNORMAL HIGH (ref 65–99)
Potassium: 3.2 mmol/L — ABNORMAL LOW (ref 3.5–5.3)
Sodium: 137 mmol/L (ref 135–146)

## 2015-09-11 LAB — CBC WITH DIFFERENTIAL/PLATELET
Basophils Absolute: 0.1 K/uL (ref 0.0–0.1)
Basophils Relative: 1 % (ref 0–1)
Eosinophils Absolute: 0 K/uL (ref 0.0–0.7)
Eosinophils Relative: 0 % (ref 0–5)
HCT: 43.3 % (ref 36.0–46.0)
Hemoglobin: 14.5 g/dL (ref 12.0–15.0)
Lymphocytes Relative: 33 % (ref 12–46)
Lymphs Abs: 1.8 K/uL (ref 0.7–4.0)
MCH: 30.7 pg (ref 26.0–34.0)
MCHC: 33.5 g/dL (ref 30.0–36.0)
MCV: 91.5 fL (ref 78.0–100.0)
MPV: 11.7 fL (ref 8.6–12.4)
Monocytes Absolute: 0.4 K/uL (ref 0.1–1.0)
Monocytes Relative: 8 % (ref 3–12)
Neutro Abs: 3.2 K/uL (ref 1.7–7.7)
Neutrophils Relative %: 58 % (ref 43–77)
Platelets: 186 K/uL (ref 150–400)
RBC: 4.73 MIL/uL (ref 3.87–5.11)
RDW: 13.2 % (ref 11.5–15.5)
WBC: 5.5 K/uL (ref 4.0–10.5)

## 2015-09-12 LAB — PROTIME-INR
INR: 0.97 (ref <1.50)
Prothrombin Time: 13 seconds (ref 11.6–15.2)

## 2015-09-12 LAB — APTT: APTT: 30 s (ref 24–37)

## 2015-09-13 ENCOUNTER — Telehealth: Payer: Self-pay

## 2015-09-13 DIAGNOSIS — I10 Essential (primary) hypertension: Secondary | ICD-10-CM

## 2015-09-13 MED ORDER — POTASSIUM CHLORIDE CRYS ER 20 MEQ PO TBCR: 20.0000 meq | EXTENDED_RELEASE_TABLET | Freq: Every day | ORAL | Status: DC

## 2015-09-13 NOTE — Telephone Encounter (Signed)
-----   Message from Sueanne Margarita, MD sent at 09/12/2015  1:32 PM EST ----- Potassium is low - Please start Concrete 29meq take 2 tablets now and repeat 2 tablets at 8pm today.  Starting tomorrow 12/1 take 1 tablet daily.  Check BMET in 1 week

## 2015-09-13 NOTE — Telephone Encounter (Signed)
Informed patient of results and verbal understanding expressed.  Instructed patient to START KDUR 20 meq daily. Patient understands to take 2 tablets now and 2 tablets this afternoon.  Repeat BMET scheduled for 09/20/15. Patient agrees with treatment plan.

## 2015-09-14 ENCOUNTER — Encounter (HOSPITAL_COMMUNITY)
Admission: RE | Disposition: A | Discharge status: Home / Self Care | Payer: Self-pay | Source: Ambulatory Visit | Attending: Cardiovascular Disease

## 2015-09-14 ENCOUNTER — Encounter (HOSPITAL_COMMUNITY): Payer: Self-pay | Admitting: Cardiovascular Disease

## 2015-09-14 ENCOUNTER — Ambulatory Visit (HOSPITAL_COMMUNITY)
Admission: RE | Admit: 2015-09-14 | Discharge: 2015-09-14 | Disposition: A | Discharge status: Home / Self Care | Payer: PPO | Source: Ambulatory Visit | Attending: Cardiovascular Disease | Admitting: Cardiovascular Disease

## 2015-09-14 DIAGNOSIS — K219 Gastro-esophageal reflux disease without esophagitis: Secondary | ICD-10-CM | POA: Insufficient documentation

## 2015-09-14 DIAGNOSIS — G4733 Obstructive sleep apnea (adult) (pediatric): Secondary | ICD-10-CM | POA: Insufficient documentation

## 2015-09-14 DIAGNOSIS — R931 Abnormal findings on diagnostic imaging of heart and coronary circulation: Secondary | ICD-10-CM | POA: Diagnosis not present

## 2015-09-14 DIAGNOSIS — Z7982 Long term (current) use of aspirin: Secondary | ICD-10-CM | POA: Insufficient documentation

## 2015-09-14 DIAGNOSIS — Z882 Allergy status to sulfonamides status: Secondary | ICD-10-CM | POA: Insufficient documentation

## 2015-09-14 DIAGNOSIS — Z880 Allergy status to penicillin: Secondary | ICD-10-CM | POA: Insufficient documentation

## 2015-09-14 DIAGNOSIS — E785 Hyperlipidemia, unspecified: Secondary | ICD-10-CM | POA: Diagnosis not present

## 2015-09-14 DIAGNOSIS — I10 Essential (primary) hypertension: Secondary | ICD-10-CM | POA: Diagnosis not present

## 2015-09-14 DIAGNOSIS — R002 Palpitations: Secondary | ICD-10-CM | POA: Insufficient documentation

## 2015-09-14 DIAGNOSIS — J45909 Unspecified asthma, uncomplicated: Secondary | ICD-10-CM | POA: Insufficient documentation

## 2015-09-14 DIAGNOSIS — M797 Fibromyalgia: Secondary | ICD-10-CM | POA: Diagnosis not present

## 2015-09-14 DIAGNOSIS — R9439 Abnormal result of other cardiovascular function study: Secondary | ICD-10-CM | POA: Diagnosis not present

## 2015-09-14 DIAGNOSIS — M329 Systemic lupus erythematosus, unspecified: Secondary | ICD-10-CM | POA: Diagnosis not present

## 2015-09-14 DIAGNOSIS — R079 Chest pain, unspecified: Secondary | ICD-10-CM | POA: Insufficient documentation

## 2015-09-14 DIAGNOSIS — R000 Tachycardia, unspecified: Secondary | ICD-10-CM | POA: Insufficient documentation

## 2015-09-14 DIAGNOSIS — G43909 Migraine, unspecified, not intractable, without status migrainosus: Secondary | ICD-10-CM | POA: Insufficient documentation

## 2015-09-14 HISTORY — PX: CARDIAC CATHETERIZATION: SHX172

## 2015-09-14 LAB — POTASSIUM
POTASSIUM: 5.6 mmol/L — AB (ref 3.5–5.1)
POTASSIUM: 4.6 mmol/L (ref 3.5–5.1)

## 2015-09-14 SURGERY — LEFT HEART CATH AND CORONARY ANGIOGRAPHY

## 2015-09-14 MED ORDER — HEPARIN (PORCINE) IN NACL 2-0.9 UNIT/ML-% IJ SOLN
INTRAMUSCULAR | Status: DC | PRN
  Administered 2015-09-14: 11:00:00

## 2015-09-14 MED ORDER — SODIUM CHLORIDE 0.9 % IJ SOLN: 3.0000 mL | INTRAMUSCULAR | Status: DC | PRN

## 2015-09-14 MED ORDER — SODIUM CHLORIDE 0.9 % IJ SOLN: 3.0000 mL | Freq: Two times a day (BID) | INTRAMUSCULAR | Status: DC

## 2015-09-14 MED ORDER — ACETAMINOPHEN 325 MG PO TABS: 650.0000 mg | ORAL_TABLET | ORAL | Status: DC | PRN

## 2015-09-14 MED ORDER — SODIUM CHLORIDE 0.9 % WEIGHT BASED INFUSION: 1.0000 mL/kg/h | INTRAVENOUS | Status: DC

## 2015-09-14 MED ORDER — IOHEXOL 350 MG/ML SOLN
INTRAVENOUS | Status: DC | PRN
  Administered 2015-09-14: 60 mL via INTRAVENOUS

## 2015-09-14 MED ORDER — VERAPAMIL HCL 2.5 MG/ML IV SOLN
INTRAVENOUS | Status: AC
  Filled 2015-09-14: qty 2

## 2015-09-14 MED ORDER — SODIUM CHLORIDE 0.9 % IV SOLN: 250.0000 mL | INTRAVENOUS | Status: DC | PRN

## 2015-09-14 MED ORDER — HEPARIN (PORCINE) IN NACL 2-0.9 UNIT/ML-% IJ SOLN
INTRAMUSCULAR | Status: AC
  Filled 2015-09-14: qty 1000

## 2015-09-14 MED ORDER — FENTANYL CITRATE (PF) 100 MCG/2ML IJ SOLN
INTRAMUSCULAR | Status: DC | PRN
  Administered 2015-09-14 (×2): 25 ug via INTRAVENOUS

## 2015-09-14 MED ORDER — HEPARIN SODIUM (PORCINE) 1000 UNIT/ML IJ SOLN
INTRAMUSCULAR | Status: DC | PRN
  Administered 2015-09-14: 5000 [IU] via INTRAVENOUS

## 2015-09-14 MED ORDER — FENTANYL CITRATE (PF) 100 MCG/2ML IJ SOLN
INTRAMUSCULAR | Status: AC
  Filled 2015-09-14: qty 2

## 2015-09-14 MED ORDER — HEPARIN SODIUM (PORCINE) 1000 UNIT/ML IJ SOLN
INTRAMUSCULAR | Status: AC
  Filled 2015-09-14: qty 1

## 2015-09-14 MED ORDER — MIDAZOLAM HCL 2 MG/2ML IJ SOLN
INTRAMUSCULAR | Status: AC
  Filled 2015-09-14: qty 2

## 2015-09-14 MED ORDER — ASPIRIN 81 MG PO CHEW: 81.0000 mg | CHEWABLE_TABLET | ORAL | Status: DC

## 2015-09-14 MED ORDER — VERAPAMIL HCL 2.5 MG/ML IV SOLN
INTRAVENOUS | Status: DC | PRN
  Administered 2015-09-14: 10 mL via INTRA_ARTERIAL

## 2015-09-14 MED ORDER — SODIUM CHLORIDE 0.9 % IV SOLN: INTRAVENOUS | Status: DC

## 2015-09-14 MED ORDER — LIDOCAINE HCL (PF) 1 % IJ SOLN
INTRAMUSCULAR | Status: AC
  Filled 2015-09-14: qty 30

## 2015-09-14 MED ORDER — SODIUM CHLORIDE 0.9 % WEIGHT BASED INFUSION
3.0000 mL/kg/h | INTRAVENOUS | Status: DC
  Administered 2015-09-14: 3 mL/kg/h via INTRAVENOUS

## 2015-09-14 MED ORDER — MIDAZOLAM HCL 2 MG/2ML IJ SOLN
INTRAMUSCULAR | Status: DC | PRN
  Administered 2015-09-14 (×2): 1 mg via INTRAVENOUS

## 2015-09-14 MED ORDER — ONDANSETRON HCL 4 MG/2ML IJ SOLN: 4.0000 mg | Freq: Four times a day (QID) | INTRAMUSCULAR | Status: DC | PRN

## 2015-09-14 SURGICAL SUPPLY — 11 items

## 2015-09-14 NOTE — Discharge Instructions (Signed)
Angiogram, Care After °Refer to this sheet in the next few weeks. These instructions provide you with information about caring for yourself after your procedure. Your health care provider may also give you more specific instructions. Your treatment has been planned according to current medical practices, but problems sometimes occur. Call your health care provider if you have any problems or questions after your procedure. °WHAT TO EXPECT AFTER THE PROCEDURE °After your procedure, it is typical to have the following: °· Bruising at the catheter insertion site that usually fades within 1-2 weeks. °· Blood collecting in the tissue (hematoma) that may be painful to the touch. It should usually decrease in size and tenderness within 1-2 weeks. °HOME CARE INSTRUCTIONS °· Take medicines only as directed by your health care provider. °· You may shower 24-48 hours after the procedure or as directed by your health care provider. Remove the bandage (dressing) and gently wash the site with plain soap and water. Pat the area dry with a clean towel. Do not rub the site, because this may cause bleeding. °· Do not take baths, swim, or use a hot tub until your health care provider approves. °· Check your insertion site every day for redness, swelling, or drainage. °· Do not apply powder or lotion to the site. °· Do not lift over 10 lb (4.5 kg) for 5 days after your procedure or as directed by your health care provider. °· Ask your health care provider when it is okay to: °¨ Return to work or school. °¨ Resume usual physical activities or sports. °¨ Resume sexual activity. °· Do not drive home if you are discharged the same day as the procedure. Have someone else drive you. °· You may drive 24 hours after the procedure unless otherwise instructed by your health care provider. °· Do not operate machinery or power tools for 24 hours after the procedure or as directed by your health care provider. °· If your procedure was done as an  outpatient procedure, which means that you went home the same day as your procedure, a responsible adult should be with you for the first 24 hours after you arrive home. °· Keep all follow-up visits as directed by your health care provider. This is important. °SEEK MEDICAL CARE IF: °· You have a fever. °· You have chills. °· You have increased bleeding from the catheter insertion site. Hold pressure on the site and call 911. °SEEK IMMEDIATE MEDICAL CARE IF: °· You have unusual pain at the catheter insertion site. °· You have redness, warmth, or swelling at the catheter insertion site. °· You have drainage (other than a small amount of blood on the dressing) from the catheter insertion site. °· The catheter insertion site is bleeding, and the bleeding does not stop after 30 minutes of holding steady pressure on the site. °· The area near or just beyond the catheter insertion site becomes pale, cool, tingly, or numb. °  °This information is not intended to replace advice given to you by your health care provider. Make sure you discuss any questions you have with your health care provider. °  °Document Released: 04/17/2005 Document Revised: 10/20/2014 Document Reviewed: 03/02/2013 °Elsevier Interactive Patient Education ©2016 [employer] ° °

## 2015-09-14 NOTE — H&P (View-Only) (Signed)
Cardiology Office Note   Date:  08/23/2015   ID:  SELLA BUYS, DOB [birth year], MRN [id number]  PCP:  Osborne Casco, MD    Chief Complaint  Patient presents with  . Shortness of Breath  . Tachycardia      History of Present Illness: Jessica Watts is a 69 y.o. female who presents for evaluation of SOB and palpitations.  She recently had surgery for arthroscopic knee surgery and was kept overnight for sleep apnea.  She was also noted to have some tachycardia and wide fluctuations in her  BP during the surgery.  She has a history of OSA in the past but has not been on CPAP.  She is a poor historian so difficult to get an accurate history.  She has had some intermittent chest discomfort over the past few months.  She describes it as a squeezing sensation that has happened 3 times lasting a few minutes and then resolves on its own.  It only occurs at night.  There is associated diaphoresis but no nausea.  She has had some problems with SOB that only occurs at night.  She has had palpitations but they only occur at night and she can see her heart beating fast. She says the CP, SOB and palpitations only occur at night and never with exertion.   She occasionally has some LE edema.      Past Medical History  Diagnosis Date  . Hypertension   . Asthma   . Sleep apnea     last sleep study 2000, not using CPAP  . Lupus ([place])   . Fibromyalgia   . GERD (gastroesophageal reflux disease)   . Headache(784.0)     migraines  . Complication of anesthesia     has difficulties related to being Native American; multiple days in hospital after hysterectomy years ago  . Migraine   . Peptic ulcer disease   . Hyperlipidemia     Past Surgical History  Procedure Laterality Date  . Cervical fusion      C2/3  . Tonsillectomy    . Abdominal hysterectomy    . Back surgery      lumbar fusion  . Shoulder surgery    . Carpal tunnel release      bilat  . Neuroma  surgery      L foot  . Anterior cervical decomp/discectomy fusion  07/20/2012    Procedure: ANTERIOR CERVICAL DECOMPRESSION/DISCECTOMY FUSION 2 LEVELS;  Surgeon: Charlie Pitter, MD;  Location: [facility];  Service: Neurosurgery;  Laterality: N/A;  cervical three- four , four -  five  . Ulnar nerve transposition  07/20/2012    Procedure: ULNAR NERVE DECOMPRESSION/TRANSPOSITION;  Surgeon: Charlie Pitter, MD;  Location: [facility];  Service: Neurosurgery;  Laterality: Left;     Current Outpatient Prescriptions  Medication Sig Dispense Refill  . albuterol (PROAIR HFA) 108 (90 BASE) MCG/ACT inhaler Inhale 2 puffs into the lungs every 4 (four) hours as needed.    Marland Kitchen aspirin EC 81 MG tablet Take 81 mg by mouth daily.    . Calcium Carbonate-Vitamin D (CALCIUM 600/VITAMIN D) 600-400 MG-UNIT per tablet Take 2 tablets by mouth daily with breakfast.    . fish oil-omega-3 fatty acids 1000 MG capsule Take 1,000 mg by mouth daily.    . fluticasone (FLONASE) 50 MCG/ACT nasal spray Place 1 spray into the nose daily as  needed. For nasal congestion    . gabapentin (NEURONTIN) 300 MG capsule Take 1 capsule (300 mg total) by mouth 3 (three) times daily. 90 capsule 1  . GuaiFENesin (MUCINEX PO) Take 1 tablet by mouth every 12 (twelve) hours.    . hydroxychloroquine (PLAQUENIL) 200 MG tablet Take 1 tablet by mouth daily.    Marland Kitchen losartan-hydrochlorothiazide (HYZAAR) 100-12.5 MG tablet Take 1 tablet by mouth daily.    . mometasone (NASONEX) 50 MCG/ACT nasal spray Place 2 sprays into both nostrils daily.    . Multiple Vitamin (MULTIVITAMIN WITH MINERALS) TABS Take 1 tablet by mouth daily.    Marland Kitchen omeprazole (PRILOSEC) 40 MG capsule Take 40 mg by mouth daily.    . vitamin E 400 UNIT capsule Take 1 capsule by mouth daily.     No current facility-administered medications for this visit.    Allergies:   Ace inhibitors; Amoxicillin; and Sulfa antibiotics    Social History:  The patient  reports that she has never smoked.  She does not have any smokeless tobacco history on file. She reports that she does not drink alcohol or use illicit drugs.   Family History:  The patient's family history is not on file.    ROS:  Please see the history of present illness.   Otherwise, review of systems are positive for none.   All other systems are reviewed and negative.    PHYSICAL EXAM: VS:  BP 138/98 mmHg  Pulse 83  Ht 5' (1.524 m)  Wt 213 lb 12.8 oz (96.979 kg)  BMI 41.75 kg/m2 , BMI Body mass index is 41.75 kg/(m^2). GEN: Well nourished, well developed, in no acute distress HEENT: normal Neck: no JVD, carotid bruits, or masses Cardiac: RRR; no murmurs, rubs, or gallops,no edema  Respiratory:  clear to auscultation bilaterally, normal work of breathing GI: soft, nontender, nondistended, + BS MS: no deformity or atrophy Skin: warm and dry, no rash Neuro:  Strength and sensation are intact Psych: euthymic mood, full affect   EKG:  EKG is ordered today. The ekg ordered today demonstrates NSR with anterior infarct and no ST changes   Recent Labs: No results found for requested labs within last 365 days.    Lipid Panel No results found for: CHOL, TRIG, HDL, CHOLHDL, VLDL, LDLCALC, LDLDIRECT    Wt Readings from Last 3 Encounters:  08/23/15 213 lb 12.8 oz (96.979 kg)  04/12/14 211 lb (95.709 kg)  07/20/12 198 lb (89.812 kg)        ASSESSMENT AND PLAN:  1.  Chest pain with typical and atypical components.  It is nonexertional and only occurs at night raising the question of possible GERD.  It is associated with diaphoresis and SOB.  I will get a stress myoview to rule out ischemia.   2.  SOB - ? Related to underlying OSA since it only occurs at night.  Could also be GERD. Will check a 2D echo to assess LVF. 3.  Palpitations - will get a 30 day event monitor to assess for PAF 4.  OSA - she had been on CPAP remotely but not now.  She was recently kept overnight due to sleep apnea after orthopedic  surgery.  Will set up 30 day heart monitor.    Current medicines are reviewed at length with the patient today.  The patient does not have concerns regarding medicines.  The following changes have been made:  no change  Labs/ tests ordered today: See above Assessment and Plan  No orders of the defined types were placed in this encounter.     Disposition:   FU with me PRN pending results of studies Signed, Sueanne Margarita, MD  08/23/2015 2:19 PM    [facility] [place], [place], [place]  [address] Phone: [phone]; Fax: [phone]

## 2015-09-14 NOTE — Interval H&P Note (Signed)
Cath Lab Visit (complete for each Cath Lab visit)  Clinical Evaluation Leading to the Procedure:   ACS: No.  Non-ACS:    Anginal Classification: CCS II  Anti-ischemic medical therapy: No Therapy  Non-Invasive Test Results: Intermediate-risk stress test findings: cardiac mortality 1-3%/year  Prior CABG: No previous CABG      History and Physical Interval Note:  09/14/2015 10:28 AM  Jessica Watts  has presented today for surgery, with the diagnosis of abnormal mioview  The various methods of treatment have been discussed with the patient and family. After consideration of risks, benefits and other options for treatment, the patient has consented to  Procedure(s): Left Heart Cath and Coronary Angiography (N/A) as a surgical intervention .  The patient's history has been reviewed, patient examined, no change in status, stable for surgery.  I have reviewed the patient's chart and labs.  Questions were answered to the patient's satisfaction.     Sherren Mocha

## 2015-09-14 NOTE — Research (Signed)
[place] Study Informed Consent   Subject Name: Jessica Watts  Subject met inclusion and exclusion criteria.  The informed consent form, study requirements and expectations were reviewed with the subject and questions and concerns were addressed prior to the signing of the consent form.  The subject verbalized understanding of the trial requirements.  The subject agreed to participate in the CADLAD trial and signed the informed consent.  The informed consent was obtained prior to performance of any protocol-specific procedures for the subject.  A copy of the signed informed consent was given to the subject and a copy was placed in the subject's medical record.  [name] 09/14/2015, 07:40

## 2015-09-20 ENCOUNTER — Other Ambulatory Visit (INDEPENDENT_AMBULATORY_CARE_PROVIDER_SITE_OTHER): Payer: PPO

## 2015-09-20 DIAGNOSIS — I10 Essential (primary) hypertension: Secondary | ICD-10-CM | POA: Diagnosis not present

## 2015-09-20 LAB — BASIC METABOLIC PANEL
BUN: 11 mg/dL (ref 7–25)
CHLORIDE: 102 mmol/L (ref 98–110)
CO2: 30 mmol/L (ref 20–31)
Calcium: 9.4 mg/dL (ref 8.6–10.4)
Creat: 0.73 mg/dL (ref 0.50–0.99)
Glucose, Bld: 108 mg/dL — ABNORMAL HIGH (ref 65–99)
POTASSIUM: 4.3 mmol/L (ref 3.5–5.3)
Sodium: 141 mmol/L (ref 135–146)

## 2015-10-19 ENCOUNTER — Telehealth: Payer: Self-pay | Admitting: *Deleted

## 2015-10-19 DIAGNOSIS — B349 Viral infection, unspecified: Secondary | ICD-10-CM | POA: Diagnosis not present

## 2015-10-19 DIAGNOSIS — J40 Bronchitis, not specified as acute or chronic: Secondary | ICD-10-CM | POA: Diagnosis not present

## 2015-10-19 DIAGNOSIS — M349 Systemic sclerosis, unspecified: Secondary | ICD-10-CM | POA: Diagnosis not present

## 2015-10-19 DIAGNOSIS — I471 Supraventricular tachycardia: Secondary | ICD-10-CM

## 2015-10-19 MED ORDER — METOPROLOL SUCCINATE ER 25 MG PO TB24: 25.0000 mg | ORAL_TABLET | Freq: Every day | ORAL | Status: DC

## 2015-10-19 NOTE — Telephone Encounter (Signed)
Pt notified of monitor results and findings by phone with verbal understanding. Rx sent in for Toprol XL 25 mg daily, referral to EP dx SVT. Pt aware Melissa T. will call to sched. EP appt. Pt agreeable to plan of care.

## 2015-10-30 DIAGNOSIS — I10 Essential (primary) hypertension: Secondary | ICD-10-CM | POA: Diagnosis not present

## 2015-10-30 DIAGNOSIS — E7800 Pure hypercholesterolemia, unspecified: Secondary | ICD-10-CM | POA: Diagnosis not present

## 2015-10-30 DIAGNOSIS — Z23 Encounter for immunization: Secondary | ICD-10-CM | POA: Diagnosis not present

## 2015-10-30 DIAGNOSIS — M23204 Derangement of unspecified medial meniscus due to old tear or injury, left knee: Secondary | ICD-10-CM | POA: Diagnosis not present

## 2015-10-30 DIAGNOSIS — R7309 Other abnormal glucose: Secondary | ICD-10-CM | POA: Diagnosis not present

## 2015-11-08 ENCOUNTER — Ambulatory Visit (INDEPENDENT_AMBULATORY_CARE_PROVIDER_SITE_OTHER): Payer: PPO | Admitting: Internal Medicine

## 2015-11-08 ENCOUNTER — Encounter: Payer: Self-pay | Admitting: Internal Medicine

## 2015-11-08 VITALS — BP 140/84 | HR 60 | Ht 60.5 in | Wt 210.6 lb

## 2015-11-08 DIAGNOSIS — I471 Supraventricular tachycardia, unspecified: Secondary | ICD-10-CM | POA: Insufficient documentation

## 2015-11-08 DIAGNOSIS — R0602 Shortness of breath: Secondary | ICD-10-CM | POA: Diagnosis not present

## 2015-11-08 NOTE — Assessment & Plan Note (Signed)
Most likely due to her SVT. Will follow.

## 2015-11-08 NOTE — Assessment & Plan Note (Signed)
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 She is now on metoprolol and has had some improvement in her symptoms. I have discussed the treatment options with the patient including the risks/benefits/goals/expectations of catheter ablation and she will call us if she wishes to proceed. For now she will continue her beta blocker.

## 2015-11-08 NOTE — Patient Instructions (Signed)
Medication Instructions:  Your physician recommends that you continue on your current medications as directed. Please refer to the Current Medication list given to you today.   Labwork: None ordered   Testing/Procedures:  Your physician has recommended that you have an ablation. Catheter ablation is a medical procedure used to treat some cardiac arrhythmias (irregular heartbeats). During catheter ablation, a long, thin, flexible tube is put into a blood vessel in your groin (upper thigh), or neck. This tube is called an ablation catheter. It is then guided to your heart through the blood vessel. Radio frequency waves destroy small areas of heart tissue where abnormal heartbeats may cause an arrhythmia to start. Please see the instruction sheet given to you today.      Follow-Up:  Your physician recommends that you schedule a follow-up appointment as needed  CAll if you decide to proceed with ablation.  Dates are:2/15,2/20,2/28,3/6,3/8,3/16,3/20,3/27   Any Other Special Instructions Will Be Listed Below (If Applicable).     If you need a refill on your cardiac medications before your next appointment, please call your pharmacy.

## 2015-11-08 NOTE — Progress Notes (Signed)
HPI Ms. Henslee is referred today by Dr. Radford Pax for evaluation of SVT. She is a pleasant 70 yo woman with lupus who developed palpitations nearly 20 years ago but over the past few years has had an uptick in the frequency and severity of her symptoms. She has had documented SVT at 150/min. She has never had frank syncope. She gets sob and chest pressure with her symptoms. She has never had syncope.  Allergies  Allergen Reactions  . Ace Inhibitors Cough  . Amoxicillin Other (See Comments)    Chills and body aches  . Sulfa Antibiotics Swelling and Other (See Comments)    Had eye swelling and pain after using eye drops with sulfa component     Current Outpatient Prescriptions  Medication Sig Dispense Refill  . albuterol (PROAIR HFA) 108 (90 BASE) MCG/ACT inhaler Inhale 2 puffs into the lungs every 4 (four) hours as needed.    Marland Kitchen aspirin EC 81 MG tablet Take 81 mg by mouth daily.    . Calcium Carbonate-Vitamin D (CALCIUM 600/VITAMIN D) 600-400 MG-UNIT per tablet Take 2 tablets by mouth daily with breakfast.    . cetirizine (ZYRTEC) 10 MG tablet Take 10 mg by mouth daily.    . DULoxetine (CYMBALTA) 30 MG capsule Take 30 mg by mouth daily.    . fluticasone (FLONASE) 50 MCG/ACT nasal spray Place 1 spray into the nose daily as needed. For nasal congestion    . GuaiFENesin (MUCINEX PO) Take 1 tablet by mouth every 12 (twelve) hours as needed (cough/cold symptoms).     . hydroxychloroquine (PLAQUENIL) 200 MG tablet Take 1 tablet by mouth daily.    Marland Kitchen losartan-hydrochlorothiazide (HYZAAR) 100-12.5 MG tablet Take 1 tablet by mouth daily.    . metoprolol succinate (TOPROL-XL) 25 MG 24 hr tablet Take 1 tablet (25 mg total) by mouth daily. 30 tablet 11  . mometasone (NASONEX) 50 MCG/ACT nasal spray Place 2 sprays into both nostrils daily as needed (congestion).     . Multiple Vitamin (MULTIVITAMIN WITH MINERALS) TABS Take 1 tablet by mouth daily.    Marland Kitchen omeprazole (PRILOSEC) 40 MG capsule Take 40  mg by mouth daily.    . potassium chloride SA (K-DUR,KLOR-CON) 20 MEQ tablet Take 1 tablet (20 mEq total) by mouth daily. 30 tablet 11  . vitamin E 400 UNIT capsule Take 1 capsule by mouth daily.     No current facility-administered medications for this visit.     Past Medical History  Diagnosis Date  . Hypertension   . Asthma   . Sleep apnea     last sleep study 2000, not using CPAP  . Lupus ([place])   . Fibromyalgia   . GERD (gastroesophageal reflux disease)   . Headache(784.0)     migraines  . Complication of anesthesia     has difficulties related to being Native American; multiple days in hospital after hysterectomy years ago  . Migraine   . Peptic ulcer disease   . Hyperlipidemia     ROS:   All systems reviewed and negative except as noted in the HPI.   Past Surgical History  Procedure Laterality Date  . Cervical fusion      C2/3  . Tonsillectomy    . Abdominal hysterectomy    . Back surgery      lumbar fusion  . Shoulder surgery    . Carpal tunnel release      bilat  . Neuroma surgery  L foot  . Anterior cervical decomp/discectomy fusion  07/20/2012    Procedure: ANTERIOR CERVICAL DECOMPRESSION/DISCECTOMY FUSION 2 LEVELS;  Surgeon: Charlie Pitter, MD;  Location: [facility];  Service: Neurosurgery;  Laterality: N/A;  cervical three- four , four -  five  . Ulnar nerve transposition  07/20/2012    Procedure: ULNAR NERVE DECOMPRESSION/TRANSPOSITION;  Surgeon: Charlie Pitter, MD;  Location: [facility];  Service: Neurosurgery;  Laterality: Left;  . Cardiac catheterization N/A 09/14/2015    Procedure: Left Heart Cath and Coronary Angiography;  Surgeon: Sherren Mocha, MD;  Location: [facility];  Service: Cardiovascular;  Laterality: N/A;     No family history on file.   Social History   Social History  . Marital Status: Divorced    Spouse Name: N/A  . Number of Children: N/A  . Years of Education: N/A   Occupational History  . Not on file.    Social History Main Topics  . Smoking status: Never Smoker   . Smokeless tobacco: Not on file  . Alcohol Use: No  . Drug Use: No  . Sexual Activity: Not on file   Other Topics Concern  . Not on file   Social History Narrative     BP 140/84 mmHg  Pulse 60  Ht 5' 0.5" (1.537 m)  Wt 210 lb 9.6 oz (95.528 kg)  BMI 40.44 kg/m2  Physical Exam:  Well appearing 70 yo woman, NAD HEENT: Unremarkable Neck:  6 cm JVD, no thyromegally Lymphatics:  No adenopathy Back:  No CVA tenderness Lungs:  Clear with no wheezes HEART:  Regular rate rhythm, no murmurs, no rubs, no clicks Abd:  soft, positive bowel sounds, no organomegally, no rebound, no guarding Ext:  2 plus pulses, no edema, no cyanosis, no clubbing Skin:  No rashes no nodules Neuro:  CN II through XII intact, motor grossly intact  EKG - NSR with no pre-excitation  Assess/Plan:

## 2015-11-12 ENCOUNTER — Ambulatory Visit (HOSPITAL_BASED_OUTPATIENT_CLINIC_OR_DEPARTMENT_OTHER): Payer: PPO | Attending: Cardiology | Admitting: *Deleted

## 2015-11-12 DIAGNOSIS — G4730 Sleep apnea, unspecified: Secondary | ICD-10-CM | POA: Diagnosis not present

## 2015-11-12 DIAGNOSIS — Z79899 Other long term (current) drug therapy: Secondary | ICD-10-CM | POA: Insufficient documentation

## 2015-11-12 DIAGNOSIS — R0683 Snoring: Secondary | ICD-10-CM | POA: Diagnosis not present

## 2015-11-12 DIAGNOSIS — I493 Ventricular premature depolarization: Secondary | ICD-10-CM | POA: Diagnosis not present

## 2015-11-12 DIAGNOSIS — G4733 Obstructive sleep apnea (adult) (pediatric): Secondary | ICD-10-CM | POA: Diagnosis not present

## 2015-11-14 ENCOUNTER — Telehealth: Payer: Self-pay | Admitting: Cardiology

## 2015-11-14 NOTE — Sleep Study (Signed)
Patient Name: Jessica Watts, Jessica Watts MRN: [id number] Study Date: 11/12/2015 Gender: Female D.O.B: September 23, 1946 Age (years): 79 Referring Provider: Fransico Him MD, ABSM Interpreting Physician: Fransico Him MD, ABSM RPSGT: [place], Holly  Weight (lbs): 209 Height (inches): 61 BMI: 40 Neck Size: 14.50  CLINICAL INFORMATION Sleep Study Type: Split Night CPAP Indication for sleep study: OSA  SLEEP STUDY TECHNIQUE As per the AASM Manual for the Scoring of Sleep and Associated Events v2.3 (April 2016) with a hypopnea requiring 4% desaturations. The channels recorded and monitored were frontal, central and occipital EEG, electrooculogram (EOG), submentalis EMG (chin), nasal and oral airflow, thoracic and abdominal wall motion, anterior tibialis EMG, snore microphone, electrocardiogram, and pulse oximetry. Continuous positive airway pressure (CPAP) was initiated when the patient met split night criteria and was titrated according to treat sleep-disordered breathing.  MEDICATIONS Medications taken by the patient : Albuterol, ASA, Calcium and VIt D, Zyrtec, Cymbalta, FLonase, Plaquenil, Hyzaar, Toprol, Nasonex, Prilosec, Kdur, Vit E Medications administered by patient during sleep study : No sleep medicine administered.  RESPIRATORY PARAMETERS Diagnostic Total AHI (/hr): 16.4  RDI (/hr):31.4   OA Index (/hr): 7.8  CA Index (/hr): 0.0 REM AHI (/hr): 50.0  NREM AHI (/hr):12.2  Supine AHI (/hr):33.6  Non-supine AHI (/hr):10.53 Min O2 Sat (%):78.00  Mean O2 (%):95.59  Time below 88% (min):10.4    Titration Optimal Pressure (cm):12   AHI at Optimal Pressure (/hr):0.0  Min O2 at Optimal Pressure (%):94.0 Supine % at Optimal (%):100   Sleep % at Optimal (%):96    SLEEP ARCHITECTURE The recording time for the entire night was 395.6 minutes. During a baseline period of 214.4 minutes, the patient slept for 160.8 minutes in REM and nonREM, yielding a reduced sleep efficiency of 75.0%. Sleep  onset after lights out was 13.6 minutes with a REM latency of 61.0 minutes. The patient spent 5.91% of the night in stage N1 sleep, 82.27% in stage N2 sleep, 0.62% in stage N3 and 11.20% in REM. During the titration period of 172.5 minutes, the patient slept for 121.0 minutes in REM and nonREM, yielding a reduced sleep efficiency of 70.1%. Sleep onset after CPAP initiation was 12.5 minutes with a REM latency of 39.5 minutes. The patient spent 19.83% of the night in stage N1 sleep, 69.01% in stage N2 sleep, 0.00% in stage N3 and 11.16% in REM.  CARDIAC DATA The 2 lead EKG demonstrated sinus rhythm. The mean heart rate was 71.54 beats per minute. Other EKG findings include: PVCs.  LEG MOVEMENT DATA The total Periodic Limb Movements of Sleep (PLMS) were 0. The PLMS index was 0.00 .  IMPRESSIONS - Moderate obstructive sleep apnea occurred during the diagnostic portion of the study(AHI = 16.4/hour). An optimal PAP pressure was selected for this patient ( 12 cm of water) - No significant central sleep apnea occurred during the diagnostic portion of the study (CAI = 0.0/hour). - The patient had moderate oxygen desaturation during the diagnostic portion of the study (Min O2 = 78.00%).  Time spent with oxygen desaturations < 88% was 10.4 minutes.  - The patient snored with Moderate snoring volume during the diagnostic portion of the study. - EKG findings include PVCs. - Clinically significant periodic limb movements did not occur during sleep.  DIAGNOSIS - Obstructive Sleep Apnea (327.23 [G47.33 ICD-10])  RECOMMENDATIONS - Trial of CPAP therapy on 12 cm H2O with a Small size Resmed Full Face Mask AirFit F10 mask and heated humidification. - Avoid alcohol, sedatives and other CNS depressants that may  worsen sleep apnea and disrupt normal sleep architecture. - Sleep hygiene should be reviewed to assess factors that may improve sleep quality. - Weight management and regular exercise should be initiated  or continued. - Return to Sleep Center for re-evaluation after 10 weeks of therapy  Eva, American Board of Sleep Medicine  ELECTRONICALLY SIGNED ON:  11/14/2015, 1:20 PM [place] PH: [phone]   FX: [phone] [place]

## 2015-11-14 NOTE — Addendum Note (Signed)
Addended by: Sueanne Margarita on: 11/14/2015 01:33 PM   Modules accepted: Orders

## 2015-11-14 NOTE — Telephone Encounter (Signed)
Please let patient know that they have significant sleep apnea and had successful CPAP titration and will be set up with CPAP unit.  Please let DME know that order is in EPIC.  Please set patient up for OV in 10 weeks 

## 2015-11-16 NOTE — Telephone Encounter (Signed)
Left message for patient to call back  

## 2015-11-19 ENCOUNTER — Telehealth: Payer: Self-pay | Admitting: Internal Medicine

## 2015-11-19 NOTE — Telephone Encounter (Signed)
Spoke with patient and she will come for her labs on 11/1315 and her ablation will be on 2/20 at11:30

## 2015-11-19 NOTE — Telephone Encounter (Signed)
Pt called back with new number to reach her at  [phone]

## 2015-11-19 NOTE — Telephone Encounter (Signed)
Patient has been informed of results. Stated verbal understanding.   Okay to proceed with treatment plan.  Once patient has stated CPAP Therapy, I will schedule 10 week follow-up.   [facility] has been notified of orders

## 2015-11-19 NOTE — Telephone Encounter (Signed)
New message   Pt is calling to speak to rn about scheduling procedure   12/03/2015  Is the date that she will like to use

## 2015-11-23 DIAGNOSIS — G4733 Obstructive sleep apnea (adult) (pediatric): Secondary | ICD-10-CM | POA: Diagnosis not present

## 2015-11-26 ENCOUNTER — Other Ambulatory Visit: Payer: Self-pay | Admitting: *Deleted

## 2015-11-26 ENCOUNTER — Encounter: Payer: Self-pay | Admitting: *Deleted

## 2015-11-26 ENCOUNTER — Telehealth: Payer: Self-pay | Admitting: *Deleted

## 2015-11-26 ENCOUNTER — Other Ambulatory Visit (INDEPENDENT_AMBULATORY_CARE_PROVIDER_SITE_OTHER): Payer: PPO | Admitting: *Deleted

## 2015-11-26 DIAGNOSIS — I471 Supraventricular tachycardia: Secondary | ICD-10-CM

## 2015-11-26 LAB — CBC WITH DIFFERENTIAL/PLATELET
Basophils Absolute: 0.1 10*3/uL (ref 0.0–0.1)
Basophils Relative: 1 % (ref 0–1)
Eosinophils Absolute: 0 10*3/uL (ref 0.0–0.7)
Eosinophils Relative: 0 % (ref 0–5)
HCT: 42.4 % (ref 36.0–46.0)
Hemoglobin: 14.3 g/dL (ref 12.0–15.0)
Lymphocytes Relative: 34 % (ref 12–46)
Lymphs Abs: 2.3 10*3/uL (ref 0.7–4.0)
MCH: 31.5 pg (ref 26.0–34.0)
MCHC: 33.7 g/dL (ref 30.0–36.0)
MCV: 93.4 fL (ref 78.0–100.0)
MPV: 10.7 fL (ref 8.6–12.4)
Monocytes Absolute: 0.6 10*3/uL (ref 0.1–1.0)
Monocytes Relative: 9 % (ref 3–12)
Neutro Abs: 3.8 10*3/uL (ref 1.7–7.7)
Neutrophils Relative %: 56 % (ref 43–77)
Platelets: 163 10*3/uL (ref 150–400)
RBC: 4.54 MIL/uL (ref 3.87–5.11)
RDW: 14 % (ref 11.5–15.5)
WBC: 6.7 10*3/uL (ref 4.0–10.5)

## 2015-11-26 LAB — BASIC METABOLIC PANEL
BUN: 15 mg/dL (ref 7–25)
CO2: 26 mmol/L (ref 20–31)
Calcium: 9.1 mg/dL (ref 8.6–10.4)
Chloride: 101 mmol/L (ref 98–110)
Creat: 0.73 mg/dL (ref 0.50–0.99)
Glucose, Bld: 98 mg/dL (ref 65–99)
Potassium: 3.5 mmol/L (ref 3.5–5.3)
Sodium: 138 mmol/L (ref 135–146)

## 2015-11-26 NOTE — Telephone Encounter (Signed)
Patient needs to schedule an appointment with Dr. Radford Pax for a 10 week sleep follow-up.  This appointment needs to be scheduled at the end of April or the beginning of May.  Left message for patient to call to schedule this appointment

## 2015-12-03 ENCOUNTER — Ambulatory Visit (HOSPITAL_COMMUNITY)
Admission: RE | Admit: 2015-12-03 | Discharge: 2015-12-04 | Disposition: A | Discharge status: Home / Self Care | Payer: PPO | Source: Ambulatory Visit | Attending: Internal Medicine | Admitting: Internal Medicine

## 2015-12-03 ENCOUNTER — Encounter (HOSPITAL_COMMUNITY): Payer: Self-pay | Admitting: Internal Medicine

## 2015-12-03 ENCOUNTER — Encounter (HOSPITAL_COMMUNITY)
Admission: RE | Disposition: A | Discharge status: Home / Self Care | Payer: Self-pay | Source: Ambulatory Visit | Attending: Internal Medicine

## 2015-12-03 DIAGNOSIS — I471 Supraventricular tachycardia, unspecified: Secondary | ICD-10-CM | POA: Diagnosis present

## 2015-12-03 DIAGNOSIS — G4733 Obstructive sleep apnea (adult) (pediatric): Secondary | ICD-10-CM | POA: Insufficient documentation

## 2015-12-03 DIAGNOSIS — Z7982 Long term (current) use of aspirin: Secondary | ICD-10-CM | POA: Insufficient documentation

## 2015-12-03 DIAGNOSIS — I10 Essential (primary) hypertension: Secondary | ICD-10-CM | POA: Diagnosis not present

## 2015-12-03 DIAGNOSIS — Z880 Allergy status to penicillin: Secondary | ICD-10-CM | POA: Diagnosis not present

## 2015-12-03 DIAGNOSIS — M329 Systemic lupus erythematosus, unspecified: Secondary | ICD-10-CM | POA: Insufficient documentation

## 2015-12-03 DIAGNOSIS — M797 Fibromyalgia: Secondary | ICD-10-CM | POA: Diagnosis not present

## 2015-12-03 DIAGNOSIS — K219 Gastro-esophageal reflux disease without esophagitis: Secondary | ICD-10-CM | POA: Insufficient documentation

## 2015-12-03 DIAGNOSIS — E785 Hyperlipidemia, unspecified: Secondary | ICD-10-CM | POA: Insufficient documentation

## 2015-12-03 HISTORY — DX: Supraventricular tachycardia, unspecified: I47.10

## 2015-12-03 HISTORY — PX: ELECTROPHYSIOLOGIC STUDY: SHX172A

## 2015-12-03 HISTORY — DX: Supraventricular tachycardia: I47.1

## 2015-12-03 HISTORY — PX: ABLATION OF DYSRHYTHMIC FOCUS: SHX254

## 2015-12-03 SURGERY — A-FLUTTER/A-TACH/SVT ABLATION

## 2015-12-03 MED ORDER — METOPROLOL SUCCINATE ER 25 MG PO TB24
25.0000 mg | ORAL_TABLET | Freq: Every day | ORAL | Status: DC
  Administered 2015-12-03 – 2015-12-04 (×2): 25 mg via ORAL
  Filled 2015-12-03 (×2): qty 1

## 2015-12-03 MED ORDER — HEPARIN (PORCINE) IN NACL 2-0.9 UNIT/ML-% IJ SOLN
INTRAMUSCULAR | Status: DC | PRN
  Administered 2015-12-03: 12:00:00

## 2015-12-03 MED ORDER — DULOXETINE HCL 30 MG PO CPEP
30.0000 mg | ORAL_CAPSULE | Freq: Every day | ORAL | Status: DC
  Administered 2015-12-03 – 2015-12-04 (×2): 30 mg via ORAL
  Filled 2015-12-03 (×3): qty 1

## 2015-12-03 MED ORDER — MIDAZOLAM HCL 5 MG/5ML IJ SOLN
INTRAMUSCULAR | Status: DC | PRN
  Administered 2015-12-03: 1 mg via INTRAVENOUS
  Administered 2015-12-03: 2 mg via INTRAVENOUS
  Administered 2015-12-03: 1 mg via INTRAVENOUS
  Administered 2015-12-03: 2 mg via INTRAVENOUS

## 2015-12-03 MED ORDER — FENTANYL CITRATE (PF) 100 MCG/2ML IJ SOLN
INTRAMUSCULAR | Status: AC
  Filled 2015-12-03: qty 2

## 2015-12-03 MED ORDER — CALCIUM CARBONATE-VITAMIN D 500-200 MG-UNIT PO TABS
2.0000 | ORAL_TABLET | Freq: Every day | ORAL | Status: DC
  Administered 2015-12-04: 08:00:00 2 via ORAL
  Filled 2015-12-03: qty 2

## 2015-12-03 MED ORDER — SODIUM CHLORIDE 0.9 % IV SOLN
1.0000 mg | INTRAVENOUS | Status: DC | PRN
  Administered 2015-12-03: 4 ug/min via INTRAVENOUS

## 2015-12-03 MED ORDER — VITAMIN E 180 MG (400 UNIT) PO CAPS
400.0000 [IU] | ORAL_CAPSULE | Freq: Every day | ORAL | Status: DC
  Administered 2015-12-03 – 2015-12-04 (×2): 400 [IU] via ORAL
  Filled 2015-12-03 (×3): qty 1

## 2015-12-03 MED ORDER — SODIUM CHLORIDE 0.9% FLUSH: 3.0000 mL | INTRAVENOUS | Status: DC | PRN

## 2015-12-03 MED ORDER — ACETAMINOPHEN 325 MG PO TABS
650.0000 mg | ORAL_TABLET | ORAL | Status: DC | PRN
  Administered 2015-12-03: 15:00:00 650 mg via ORAL
  Filled 2015-12-03: qty 2

## 2015-12-03 MED ORDER — OFF THE BEAT BOOK
Freq: Once | Status: AC
  Administered 2015-12-03: 16:00:00
  Filled 2015-12-03: qty 1

## 2015-12-03 MED ORDER — ONDANSETRON HCL 4 MG/2ML IJ SOLN: 4.0000 mg | Freq: Four times a day (QID) | INTRAMUSCULAR | Status: DC | PRN

## 2015-12-03 MED ORDER — MIDAZOLAM HCL 5 MG/5ML IJ SOLN
INTRAMUSCULAR | Status: AC
  Filled 2015-12-03: qty 5

## 2015-12-03 MED ORDER — BUPIVACAINE HCL (PF) 0.25 % IJ SOLN
INTRAMUSCULAR | Status: AC
  Filled 2015-12-03: qty 30

## 2015-12-03 MED ORDER — SODIUM CHLORIDE 0.9 % IV SOLN: 250.0000 mL | INTRAVENOUS | Status: DC | PRN

## 2015-12-03 MED ORDER — HYDROXYCHLOROQUINE SULFATE 200 MG PO TABS
200.0000 mg | ORAL_TABLET | Freq: Every day | ORAL | Status: DC
  Administered 2015-12-03 – 2015-12-04 (×2): 200 mg via ORAL
  Filled 2015-12-03 (×3): qty 1

## 2015-12-03 MED ORDER — ALBUTEROL SULFATE (2.5 MG/3ML) 0.083% IN NEBU: 3.0000 mL | INHALATION_SOLUTION | RESPIRATORY_TRACT | Status: DC | PRN

## 2015-12-03 MED ORDER — SODIUM CHLORIDE 0.9% FLUSH
3.0000 mL | Freq: Two times a day (BID) | INTRAVENOUS | Status: DC
  Administered 2015-12-03 (×2): 3 mL via INTRAVENOUS

## 2015-12-03 MED ORDER — BUPIVACAINE HCL (PF) 0.25 % IJ SOLN
INTRAMUSCULAR | Status: DC | PRN
  Administered 2015-12-03: 31 mL

## 2015-12-03 MED ORDER — TOBRAMYCIN-DEXAMETHASONE 0.3-0.1 % OP SUSP
1.0000 [drp] | Freq: Four times a day (QID) | OPHTHALMIC | Status: DC
  Administered 2015-12-03 – 2015-12-04 (×3): 1 [drp] via OPHTHALMIC
  Filled 2015-12-03: qty 2.5

## 2015-12-03 MED ORDER — ISOPROTERENOL HCL 0.2 MG/ML IJ SOLN
INTRAMUSCULAR | Status: AC
  Filled 2015-12-03: qty 5

## 2015-12-03 MED ORDER — HEPARIN (PORCINE) IN NACL 2-0.9 UNIT/ML-% IJ SOLN
INTRAMUSCULAR | Status: AC
  Filled 2015-12-03: qty 500

## 2015-12-03 MED ORDER — FENTANYL CITRATE (PF) 100 MCG/2ML IJ SOLN
INTRAMUSCULAR | Status: DC | PRN
  Administered 2015-12-03: 12.5 ug via INTRAVENOUS
  Administered 2015-12-03 (×2): 25 ug via INTRAVENOUS
  Administered 2015-12-03: 12.5 ug via INTRAVENOUS

## 2015-12-03 MED ORDER — ASPIRIN EC 81 MG PO TBEC
81.0000 mg | DELAYED_RELEASE_TABLET | Freq: Every day | ORAL | Status: DC
  Administered 2015-12-03 – 2015-12-04 (×2): 81 mg via ORAL
  Filled 2015-12-03 (×2): qty 1

## 2015-12-03 SURGICAL SUPPLY — 11 items
BAG SNAP BAND KOVER 36X36 (MISCELLANEOUS) ×3 IMPLANT
CATH CELSIUS THERM D CV 7F (ABLATOR) ×3 IMPLANT
CATH HEX JOS 2-5-2 65CM 6F REP (CATHETERS) ×3 IMPLANT
CATH JOSEPH QUAD ALLRED 6F REP (CATHETERS) ×6 IMPLANT
PACK EP LATEX FREE (CUSTOM PROCEDURE TRAY) ×2
PACK EP LF (CUSTOM PROCEDURE TRAY) ×1 IMPLANT
PAD DEFIB LIFELINK (PAD) ×3 IMPLANT
SHEATH PINNACLE 6F 10CM (SHEATH) ×6 IMPLANT
SHEATH PINNACLE 7F 10CM (SHEATH) ×3 IMPLANT
SHEATH PINNACLE 8F 10CM (SHEATH) ×3 IMPLANT
SHIELD RADPAD SCOOP 12X17 (MISCELLANEOUS) ×3 IMPLANT

## 2015-12-03 NOTE — Interval H&P Note (Signed)
History and Physical Interval Note:  12/03/2015 10:44 AM  Jessica Watts  has presented today for surgery, with the diagnosis of svt  The various methods of treatment have been discussed with the patient and family. After consideration of risks, benefits and other options for treatment, the patient has consented to  Procedure(s): SVT Ablation (N/A) as a surgical intervention .  The patient's history has been reviewed, patient examined, no change in status, stable for surgery.  I have reviewed the patient's chart and labs.  Questions were answered to the patient's satisfaction.     Cristopher Peru

## 2015-12-03 NOTE — Progress Notes (Signed)
Patient arrived to floor and complains of a headache at approx 1430, patient medicated for headache and cool cloth applied to head per patient request, lights dimmed. Effective. Dressing noted to right groin and rt IJ site in place C/D/I. Bedrest will be complete at 2000, patient educated on restrictions. No s/s of distress noted or complaints voiced at this time. Call bell is in reach.

## 2015-12-03 NOTE — Discharge Summary (Signed)
ELECTROPHYSIOLOGY PROCEDURE DISCHARGE SUMMARY    Patient ID: Jessica Watts,  MRN: [id number], DOB/AGE: [birth year] 70 y.o.  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  Hypertension 2.  OSA 3.  GERD 4.  Migraines 5.  Hyperlipdiemia 6.  Lupus  Allergies  Allergen Reactions  . Ace Inhibitors Cough  . Amoxicillin Other (See Comments)    Chills and body aches  . Sulfa Antibiotics Swelling and Other (See Comments)    Had eye swelling and pain after using eye drops with sulfa component     Procedures This Admission: 1.  Electrophysiology study and radiofrequency catheter ablation on 12/03/15 by Dr Lovena Le.  This study demonstrated  AVNRT with successful slow pathway modification.  There were no inducible arrhythmias following ablation and no early apparent complications.   Brief HPI: Jessica Watts is a 70 y.o. female with a past medical history as outlined above.  She has had increasing tachypalpitations with documented SVT.  They have failed medical therapy with Toprol.  Risks, benefits, and alternatives to ablation were reviewed with the patient who wished to proceed.   Hospital Course:  The patient was admitted and underwent EPS/RFCA with details as outlined above. She was monitored on telemetry overnight which demonstrated sinus rhythm with PAC's, short run AT.  Groin and neck incisions were without complication.  They were examined and  Considered stable for discharge to home.  Follow up will be arranged in 4 weeks.  Wound care and restrictions were reviewed with the patient prior to discharge.   Physical Exam: Filed Vitals:   12/03/15 1900 12/03/15 2000 12/03/15 2119 12/04/15 0538  BP: 108/42 121/51 134/28 139/45    Pulse: 74 59 58 58  Temp:   97.9 F (36.6 C) 98 F (36.7 C)  TempSrc:   Oral Oral  Resp: 23 15 17 17   Height:      Weight:    207 lb 3.7 oz (94 kg)  SpO2: 99% 100% 99% 96%    GEN- The patient is elderly and obese appearing, alert and oriented x 3 today.   HEENT: normocephalic, atraumatic; sclera clear, conjunctiva pink; hearing intact; oropharynx clear; neck supple Lungs- Clear to ausculation bilaterally, normal work of breathing.  No wheezes, rales, rhonchi Heart- Regular rate and rhythm, no murmurs, rubs or gallops GI- soft, non-tender, non-distended, bowel sounds present Extremities- no clubbing, cyanosis, or edema; DP/PT/radial pulses 2+ bilaterally, groin without hematoma/bruit MS- no significant deformity or atrophy Skin- warm and dry, no rash or lesion Psych- euthymic mood, full affect Neuro- strength and sensation are intact   Discharge Vitals: Blood pressure 139/45, pulse 58, temperature 98 F (36.7 C), temperature source Oral, resp. rate 17, height 5' 1.5" (1.562 m), weight 207 lb 3.7 oz (94 kg), SpO2 96 %.   Labs:   Lab Results  Component Value Date   WBC 6.7 11/26/2015   HGB 14.3 11/26/2015   HCT 42.4 11/26/2015   MCV 93.4 11/26/2015   PLT 163 11/26/2015   No results for input(s): NA, K, CL, CO2, BUN, CREATININE, CALCIUM, PROT, BILITOT, ALKPHOS, ALT, AST, GLUCOSE in the last 168 hours.  Invalid input(s): LABALBU  Discharge Medications:    Medication List    STOP taking these medications        potassium chloride SA 20 MEQ  tablet  Commonly known as:  K-DUR,KLOR-CON      TAKE these medications        aspirin EC 81 MG tablet  Take 81 mg by mouth daily.     CALCIUM 600/VITAMIN D 600-400 MG-UNIT tablet  Generic drug:  Calcium Carbonate-Vitamin D  Take 2 tablets by mouth daily with breakfast.     cetirizine 10 MG tablet  Commonly known as:  ZYRTEC  Take 10 mg by mouth daily.     DULoxetine 30 MG capsule  Commonly known as:  CYMBALTA  Take 30 mg  by mouth daily. Reported on 11/28/2015     fluticasone 50 MCG/ACT nasal spray  Commonly known as:  FLONASE  Place 1 spray into the nose daily as needed. For nasal congestion     losartan-hydrochlorothiazide 100-12.5 MG tablet  Commonly known as:  HYZAAR  Take 1 tablet by mouth daily.     metoprolol succinate 25 MG 24 hr tablet  Commonly known as:  TOPROL-XL  Take 1 tablet (25 mg total) by mouth daily.     NASONEX 50 MCG/ACT nasal spray  Generic drug:  mometasone  Place 2 sprays into both nostrils daily as needed (congestion).     omeprazole 40 MG capsule  Commonly known as:  PRILOSEC  Take 40 mg by mouth daily.     PLAQUENIL 200 MG tablet  Generic drug:  hydroxychloroquine  Take 1 tablet by mouth daily.     PROAIR HFA 108 (90 Base) MCG/ACT inhaler  Generic drug:  albuterol  Inhale 2 puffs into the lungs every 4 (four) hours as needed for wheezing or shortness of breath.     tobramycin-dexamethasone ophthalmic solution  Commonly known as:  TOBRADEX  Place 1 drop into both eyes every 6 (six) hours.     vitamin E 400 UNIT capsule  Take 1 capsule by mouth daily.        Disposition:  Discharge Instructions    Diet - low sodium heart healthy    Complete by:  As directed      Discharge instructions    Complete by:  As directed   No driving for 3 days. No lifting over 5 lbs for 1 week. No sexual activity for 1 week. You may return to work in 1 week. Keep procedure site clean & dry. If you notice increased pain, swelling, bleeding or pus, call/return!  You may shower, but no soaking baths/hot tubs/pools for 1 week.     Increase activity slowly    Complete by:  As directed           Follow-up Information    Follow up with Cristopher Peru, MD On 01/11/2016.   Specialty:  Cardiology   Why:  at 9:15AM    Contact information:   [place] [place] [phone]       Duration of Discharge Encounter: Greater than 30 minutes including  physician time.  Signed, Chanetta Marshall, NP 12/04/2015 6:51 AM  EP Attending  Patient and examined. Agree with above. She is stable for discharge. Usual followup. I would like her to continue her current meds.   Mikle Bosworth.D.

## 2015-12-03 NOTE — Procedures (Addendum)
Order for sheath removal verified per post procedural orders. Procedure explained to patient and Rt femoral vein access site assessed: level 0, palpable dorsalis pedis and posterior tibial pulses. 6FR x2 and 8Fr Sheath removed and manual pressure applied for 10 minutes. Pre, peri, & post procedural vitals: HR 810, RR 15, O2 Sat upper 100, BP 128/47, Pain 0. Distal pulses remained intact after sheath removal. Access site level 0 and dressed with 4X4 gauze and tegaderm. Post procedural instructions discussed and return demonstration from patient. 6 hrs. Of bedrest starting @ 2pm.

## 2015-12-03 NOTE — H&P (View-Only) (Signed)
HPI Ms. Cinco is referred today by Dr. Radford Pax for evaluation of SVT. She is a pleasant 70 yo woman with lupus who developed palpitations nearly 20 years ago but over the past few years has had an uptick in the frequency and severity of her symptoms. She has had documented SVT at 150/min. She has never had frank syncope. She gets sob and chest pressure with her symptoms. She has never had syncope.  Allergies  Allergen Reactions  . Ace Inhibitors Cough  . Amoxicillin Other (See Comments)    Chills and body aches  . Sulfa Antibiotics Swelling and Other (See Comments)    Had eye swelling and pain after using eye drops with sulfa component     Current Outpatient Prescriptions  Medication Sig Dispense Refill  . albuterol (PROAIR HFA) 108 (90 BASE) MCG/ACT inhaler Inhale 2 puffs into the lungs every 4 (four) hours as needed.    Marland Kitchen aspirin EC 81 MG tablet Take 81 mg by mouth daily.    . Calcium Carbonate-Vitamin D (CALCIUM 600/VITAMIN D) 600-400 MG-UNIT per tablet Take 2 tablets by mouth daily with breakfast.    . cetirizine (ZYRTEC) 10 MG tablet Take 10 mg by mouth daily.    . DULoxetine (CYMBALTA) 30 MG capsule Take 30 mg by mouth daily.    . fluticasone (FLONASE) 50 MCG/ACT nasal spray Place 1 spray into the nose daily as needed. For nasal congestion    . GuaiFENesin (MUCINEX PO) Take 1 tablet by mouth every 12 (twelve) hours as needed (cough/cold symptoms).     . hydroxychloroquine (PLAQUENIL) 200 MG tablet Take 1 tablet by mouth daily.    Marland Kitchen losartan-hydrochlorothiazide (HYZAAR) 100-12.5 MG tablet Take 1 tablet by mouth daily.    . metoprolol succinate (TOPROL-XL) 25 MG 24 hr tablet Take 1 tablet (25 mg total) by mouth daily. 30 tablet 11  . mometasone (NASONEX) 50 MCG/ACT nasal spray Place 2 sprays into both nostrils daily as needed (congestion).     . Multiple Vitamin (MULTIVITAMIN WITH MINERALS) TABS Take 1 tablet by mouth daily.    Marland Kitchen omeprazole (PRILOSEC) 40 MG capsule Take 40  mg by mouth daily.    . potassium chloride SA (K-DUR,KLOR-CON) 20 MEQ tablet Take 1 tablet (20 mEq total) by mouth daily. 30 tablet 11  . vitamin E 400 UNIT capsule Take 1 capsule by mouth daily.     No current facility-administered medications for this visit.     Past Medical History  Diagnosis Date  . Hypertension   . Asthma   . Sleep apnea     last sleep study 2000, not using CPAP  . Lupus ([place])   . Fibromyalgia   . GERD (gastroesophageal reflux disease)   . Headache(784.0)     migraines  . Complication of anesthesia     has difficulties related to being Native American; multiple days in hospital after hysterectomy years ago  . Migraine   . Peptic ulcer disease   . Hyperlipidemia     ROS:   All systems reviewed and negative except as noted in the HPI.   Past Surgical History  Procedure Laterality Date  . Cervical fusion      C2/3  . Tonsillectomy    . Abdominal hysterectomy    . Back surgery      lumbar fusion  . Shoulder surgery    . Carpal tunnel release      bilat  . Neuroma surgery  L foot  . Anterior cervical decomp/discectomy fusion  07/20/2012    Procedure: ANTERIOR CERVICAL DECOMPRESSION/DISCECTOMY FUSION 2 LEVELS;  Surgeon: Charlie Pitter, MD;  Location: [facility];  Service: Neurosurgery;  Laterality: N/A;  cervical three- four , four -  five  . Ulnar nerve transposition  07/20/2012    Procedure: ULNAR NERVE DECOMPRESSION/TRANSPOSITION;  Surgeon: Charlie Pitter, MD;  Location: [facility];  Service: Neurosurgery;  Laterality: Left;  . Cardiac catheterization N/A 09/14/2015    Procedure: Left Heart Cath and Coronary Angiography;  Surgeon: Sherren Mocha, MD;  Location: [facility];  Service: Cardiovascular;  Laterality: N/A;     No family history on file.   Social History   Social History  . Marital Status: Divorced    Spouse Name: N/A  . Number of Children: N/A  . Years of Education: N/A   Occupational History  . Not on file.    Social History Main Topics  . Smoking status: Never Smoker   . Smokeless tobacco: Not on file  . Alcohol Use: No  . Drug Use: No  . Sexual Activity: Not on file   Other Topics Concern  . Not on file   Social History Narrative     BP 140/84 mmHg  Pulse 60  Ht 5' 0.5" (1.537 m)  Wt 210 lb 9.6 oz (95.528 kg)  BMI 40.44 kg/m2  Physical Exam:  Well appearing 70 yo woman, NAD HEENT: Unremarkable Neck:  6 cm JVD, no thyromegally Lymphatics:  No adenopathy Back:  No CVA tenderness Lungs:  Clear with no wheezes HEART:  Regular rate rhythm, no murmurs, no rubs, no clicks Abd:  soft, positive bowel sounds, no organomegally, no rebound, no guarding Ext:  2 plus pulses, no edema, no cyanosis, no clubbing Skin:  No rashes no nodules Neuro:  CN II through XII intact, motor grossly intact  EKG - NSR with no pre-excitation  Assess/Plan:

## 2015-12-03 NOTE — Procedures (Signed)
Order for sheath removal verified per post procedural orders. Procedure explained to patient and Rt IJ access site assessed: 7Fr [place] Sheath removed and manual pressure applied for 10 minutes. Pre, peri, & post procedural vitals: HR 78, RR 15, O2 Sat upper 100, BP 126/51, Pain 0. Access site level 0 and dressed with 4X4 gauze and tegaderm. Post procedural instructions discussed and return demonstration from patient.

## 2015-12-04 DIAGNOSIS — I10 Essential (primary) hypertension: Secondary | ICD-10-CM | POA: Diagnosis not present

## 2015-12-04 DIAGNOSIS — I471 Supraventricular tachycardia: Secondary | ICD-10-CM | POA: Diagnosis not present

## 2015-12-04 DIAGNOSIS — K219 Gastro-esophageal reflux disease without esophagitis: Secondary | ICD-10-CM | POA: Diagnosis not present

## 2015-12-04 DIAGNOSIS — G4733 Obstructive sleep apnea (adult) (pediatric): Secondary | ICD-10-CM | POA: Diagnosis not present

## 2015-12-04 NOTE — Progress Notes (Signed)
Pt and son given all discharge instructions and verbalized understanding. All questions answered.  Pt and son have all belongings. Discharged via wheelchair.

## 2015-12-04 NOTE — Care Management Note (Signed)
Case Management Note  Patient Details  Name: Jessica Watts MRN: [id number] Date of Birth: 07/11/1946  Subjective/Objective:   Patient from home, pta  Indep.  For dc today, no needs.                 Action/Plan:   Expected Discharge Date:                  Expected Discharge Plan:  Home/Self Care  In-House Referral:     Discharge planning Services  CM Consult  Post Acute Care Choice:    Choice offered to:     DME Arranged:    DME Agency:     HH Arranged:    [place] Agency:     Status of Service:  Completed, signed off  Medicare Important Message Given:    Date Medicare IM Given:    Medicare IM give by:    Date Additional Medicare IM Given:    Additional Medicare Important Message give by:     If discussed at [place] of Stay Meetings, dates discussed:    Additional Comments:  Zenon Mayo, RN 12/04/2015, 2:47 PM

## 2015-12-11 DIAGNOSIS — M23204 Derangement of unspecified medial meniscus due to old tear or injury, left knee: Secondary | ICD-10-CM | POA: Diagnosis not present

## 2015-12-21 DIAGNOSIS — G4733 Obstructive sleep apnea (adult) (pediatric): Secondary | ICD-10-CM | POA: Diagnosis not present

## 2016-01-01 ENCOUNTER — Encounter: Payer: Self-pay | Admitting: Cardiology

## 2016-01-11 ENCOUNTER — Encounter: Payer: Self-pay | Admitting: Internal Medicine

## 2016-01-11 ENCOUNTER — Ambulatory Visit (INDEPENDENT_AMBULATORY_CARE_PROVIDER_SITE_OTHER): Payer: PPO | Admitting: Internal Medicine

## 2016-01-11 VITALS — BP 126/74 | HR 79 | Ht 60.5 in | Wt 210.6 lb

## 2016-01-11 DIAGNOSIS — I10 Essential (primary) hypertension: Secondary | ICD-10-CM | POA: Diagnosis not present

## 2016-01-11 DIAGNOSIS — I471 Supraventricular tachycardia: Secondary | ICD-10-CM

## 2016-01-11 NOTE — Patient Instructions (Signed)
Medication Instructions:  Your physician has recommended you make the following change in your medication:  1) Wean Toprol take 1/2 tablet daily for 2 weeks then 1/2 tablet every other day for 2 weeks then stop    Labwork: None ordered   Testing/Procedures: None ordered   Follow-Up: Your physician wants you to follow-up in: 5 You will receive a reminder letter in the mail two months in advance. If you don't receive a letter, please call our office to schedule the follow-up appointment.   Any Other Special Instructions Will Be Listed Below (If Applicable).     If you need a refill on your cardiac medications before your next appointment, please call your pharmacy.

## 2016-01-11 NOTE — Progress Notes (Signed)
HPI Ms. Jessica Watts returns today after undergoin cathteter ablation of SVT. She is a pleasant 70 yo woman with lupus who underwent EP study and catheter ablation of SVT several weeks ago. She has had some palpitations but no sustained racing since her ablation. She would like to come off of her Toprol. Allergies  Allergen Reactions  . Ace Inhibitors Cough  . Amoxicillin Other (See Comments)    Chills and body aches  . Sulfa Antibiotics Swelling and Other (See Comments)    Had eye swelling and pain after using eye drops with sulfa component     Current Outpatient Prescriptions  Medication Sig Dispense Refill  . albuterol (PROAIR HFA) 108 (90 BASE) MCG/ACT inhaler Inhale 2 puffs into the lungs every 4 (four) hours as needed for wheezing or shortness of breath.     Marland Kitchen aspirin EC 81 MG tablet Take 81 mg by mouth daily.    . Calcium Carbonate-Vitamin D (CALCIUM 600/VITAMIN D) 600-400 MG-UNIT per tablet Take 2 tablets by mouth daily with breakfast.    . cetirizine (ZYRTEC) 10 MG tablet Take 10 mg by mouth daily.    . DULoxetine (CYMBALTA) 30 MG capsule Take 30 mg by mouth daily. Reported on 11/28/2015    . fluticasone (FLONASE) 50 MCG/ACT nasal spray Place 1 spray into the nose daily as needed. For nasal congestion    . hydroxychloroquine (PLAQUENIL) 200 MG tablet Take 1 tablet by mouth daily.    Marland Kitchen losartan-hydrochlorothiazide (HYZAAR) 100-12.5 MG tablet Take 1 tablet by mouth daily.    . metoprolol succinate (TOPROL-XL) 25 MG 24 hr tablet Take 1 tablet (25 mg total) by mouth daily. 30 tablet 11  . omeprazole (PRILOSEC) 40 MG capsule Take 40 mg by mouth daily.    Marland Kitchen tobramycin-dexamethasone (TOBRADEX) ophthalmic solution Place 1 drop into both eyes every 6 (six) hours.    . vitamin E 400 UNIT capsule Take 1 capsule by mouth daily.     No current facility-administered medications for this visit.     Past Medical History  Diagnosis Date  . Hypertension   . Asthma   . Sleep apnea    last sleep study 2000, not using CPAP  . Lupus ([place])   . Fibromyalgia   . GERD (gastroesophageal reflux disease)   . Headache(784.0)     migraines  . Complication of anesthesia     has difficulties related to being Native American; multiple days in hospital after hysterectomy years ago  . Migraine   . Peptic ulcer disease   . Hyperlipidemia   . SVT (supraventricular tachycardia) ([place]) 12/03/2015    ablation for SVT  . Shortness of breath dyspnea     ROS:   All systems reviewed and negative except as noted in the HPI.   Past Surgical History  Procedure Laterality Date  . Cervical fusion      C2/3  . Tonsillectomy    . Abdominal hysterectomy    . Back surgery      lumbar fusion  . Shoulder surgery    . Carpal tunnel release      bilat  . Neuroma surgery      L foot  . Anterior cervical decomp/discectomy fusion  07/20/2012    Procedure: ANTERIOR CERVICAL DECOMPRESSION/DISCECTOMY FUSION 2 LEVELS;  Surgeon: Charlie Pitter, MD;  Location: [facility];  Service: Neurosurgery;  Laterality: N/A;  cervical three- four , four -  five  . Ulnar nerve transposition  07/20/2012  Procedure: ULNAR NERVE DECOMPRESSION/TRANSPOSITION;  Surgeon: Charlie Pitter, MD;  Location: [facility];  Service: Neurosurgery;  Laterality: Left;  . Cardiac catheterization N/A 09/14/2015    Procedure: Left Heart Cath and Coronary Angiography;  Surgeon: Sherren Mocha, MD;  Location: [facility];  Service: Cardiovascular;  Laterality: N/A;  . Electrophysiologic study N/A 12/03/2015    Procedure: SVT Ablation;  Surgeon: Evans Lance, MD;  Location: [facility];  Service: Cardiovascular;  Laterality: N/A;  . Ablation of dysrhythmic focus  12/03/2015    SVT     No family history on file.   Social History   Social History  . Marital Status: Divorced    Spouse Name: N/A  . Number of Children: N/A  . Years of Education: N/A   Occupational History  . Not on file.   Social History Main  Topics  . Smoking status: Never Smoker   . Smokeless tobacco: Never Used  . Alcohol Use: No  . Drug Use: No  . Sexual Activity: Not on file   Other Topics Concern  . Not on file   Social History Narrative     BP 126/74 mmHg  Pulse 79  Ht 5' 0.5" (1.537 m)  Wt 210 lb 9.6 oz (95.528 kg)  BMI 40.44 kg/m2  Physical Exam:  Well appearing 70 yo woman, NAD HEENT: Unremarkable Neck:  6 cm JVD, no thyromegally Lymphatics:  No adenopathy Back:  No CVA tenderness Lungs:  Clear with no wheezes HEART:  Regular rate rhythm, no murmurs, no rubs, no clicks Abd:  soft, positive bowel sounds, no organomegally, no rebound, no guarding Ext:  2 plus pulses, no edema, no cyanosis, no clubbing Skin:  No rashes no nodules Neuro:  CN II through XII intact, motor grossly intact  EKG - NSR with no pre-excitation  Assess/Plan: 1. SVT - she will wean off her Toprol. Will follow 2. HTN - I asked her to watch her blood pressure and let her primary MD or Dr. Radford Pax know if it goes up.

## 2016-01-21 DIAGNOSIS — G4733 Obstructive sleep apnea (adult) (pediatric): Secondary | ICD-10-CM | POA: Diagnosis not present

## 2016-01-29 ENCOUNTER — Ambulatory Visit: Payer: PPO | Admitting: Cardiology

## 2016-02-08 ENCOUNTER — Encounter: Payer: Self-pay | Admitting: Cardiology

## 2016-02-20 DIAGNOSIS — G4733 Obstructive sleep apnea (adult) (pediatric): Secondary | ICD-10-CM | POA: Diagnosis not present

## 2016-02-25 DIAGNOSIS — G4733 Obstructive sleep apnea (adult) (pediatric): Secondary | ICD-10-CM | POA: Diagnosis not present

## 2016-02-26 ENCOUNTER — Ambulatory Visit (INDEPENDENT_AMBULATORY_CARE_PROVIDER_SITE_OTHER): Payer: PPO | Admitting: Cardiology

## 2016-02-26 ENCOUNTER — Encounter: Payer: Self-pay | Admitting: Cardiology

## 2016-02-26 VITALS — BP 126/64 | HR 72 | Ht 61.0 in | Wt 211.1 lb

## 2016-02-26 DIAGNOSIS — R079 Chest pain, unspecified: Secondary | ICD-10-CM

## 2016-02-26 DIAGNOSIS — I10 Essential (primary) hypertension: Secondary | ICD-10-CM

## 2016-02-26 DIAGNOSIS — I471 Supraventricular tachycardia: Secondary | ICD-10-CM

## 2016-02-26 DIAGNOSIS — G4733 Obstructive sleep apnea (adult) (pediatric): Secondary | ICD-10-CM

## 2016-02-26 NOTE — Progress Notes (Addendum)
Cardiology Office Note    Date:  04/22/2016   ID:  LAVORA KRELLER, DOB 03/22/1946, MRN [id number]  PCP:  Osborne Casco, MD  Cardiologist:  Fransico Him, MD   Chief Complaint  Patient presents with  . Irregular Heart Beat  . Chest Pain    History of Present Illness:  Jessica Watts is a 70 y.o. female presents for followup of SOB, chest pain and palpitations. On last OV she complained of CP and SOB and underwent nuclear stress test that was abnormal but cath showed normal coronary arteries and normal LVEDP and nuclear stress test was false positive test.  2D echo showed normal LVF and trivial MR.  She was found to have SVT on heart monitor and was started on BB and referred to EP.  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  She denies any chest pain, LE edema. or syncope.   She had a few brief episodes of palpitations that were short lived shortly after the ablation but that has resolved.  She occasionally has some DOE but that has improved. She recently underwent PSG to evaluate for OSA.  This showed moderate OSA with an AHI of 16/hr and underwent CPAP titration to 12cm H2O.  She is doing well with her device.  She tolerates the CPAP mask and feels the pressure is adequate.     Past Medical History  Diagnosis Date  . Hypertension   . Asthma   . Sleep apnea     last sleep study 2000, not using CPAP  . Lupus ([place])   . Fibromyalgia   . GERD (gastroesophageal reflux disease)   . Headache(784.0)     migraines  . Complication of anesthesia     has difficulties related to being Native American; multiple days in hospital after hysterectomy years ago  . Migraine   . Peptic ulcer disease   . Hyperlipidemia   . SVT (supraventricular tachycardia) ([place]) 12/03/2015    ablation for SVT  . Shortness of breath dyspnea   . OSA (obstructive sleep apnea) 04/22/2016    Moderate with AHI 16/hr on CPAP    Past Surgical History  Procedure  Laterality Date  . Cervical fusion      C2/3  . Tonsillectomy    . Abdominal hysterectomy    . Back surgery      lumbar fusion  . Shoulder surgery    . Carpal tunnel release      bilat  . Neuroma surgery      L foot  . Anterior cervical decomp/discectomy fusion  07/20/2012    Procedure: ANTERIOR CERVICAL DECOMPRESSION/DISCECTOMY FUSION 2 LEVELS;  Surgeon: Charlie Pitter, MD;  Location: [facility];  Service: Neurosurgery;  Laterality: N/A;  cervical three- four , four -  five  . Ulnar nerve transposition  07/20/2012    Procedure: ULNAR NERVE DECOMPRESSION/TRANSPOSITION;  Surgeon: Charlie Pitter, MD;  Location: [facility];  Service: Neurosurgery;  Laterality: Left;  . Cardiac catheterization N/A 09/14/2015    Procedure: Left Heart Cath and Coronary Angiography;  Surgeon: Sherren Mocha, MD;  Location: [facility];  Service: Cardiovascular;  Laterality: N/A;  . Electrophysiologic study N/A 12/03/2015    Procedure: SVT Ablation;  Surgeon: Evans Lance, MD;  Location: [facility];  Service: Cardiovascular;  Laterality: N/A;  . Ablation of dysrhythmic focus  12/03/2015    SVT    Current Medications: Outpatient Prescriptions Prior to  Visit  Medication Sig Dispense Refill  . albuterol (PROAIR HFA) 108 (90 BASE) MCG/ACT inhaler Inhale 2 puffs into the lungs every 4 (four) hours as needed for wheezing or shortness of breath.     Marland Kitchen aspirin EC 81 MG tablet Take 81 mg by mouth daily.    . Calcium Carbonate-Vitamin D (CALCIUM 600/VITAMIN D) 600-400 MG-UNIT per tablet Take 2 tablets by mouth daily with breakfast.    . cetirizine (ZYRTEC) 10 MG tablet Take 10 mg by mouth daily.    . DULoxetine (CYMBALTA) 30 MG capsule Take 30 mg by mouth daily. Reported on 11/28/2015    . fluticasone (FLONASE) 50 MCG/ACT nasal spray Place 1 spray into the nose daily as needed. For nasal congestion    . hydroxychloroquine (PLAQUENIL) 200 MG tablet Take 1 tablet by mouth daily.    Marland Kitchen  losartan-hydrochlorothiazide (HYZAAR) 100-12.5 MG tablet Take 1 tablet by mouth daily.    Marland Kitchen omeprazole (PRILOSEC) 40 MG capsule Take 40 mg by mouth daily.    . vitamin E 400 UNIT capsule Take 1 capsule by mouth daily.    Marland Kitchen tobramycin-dexamethasone (TOBRADEX) ophthalmic solution Place 1 drop into both eyes every 6 (six) hours. Reported on 02/26/2016     No facility-administered medications prior to visit.     Allergies:   Ace inhibitors; Amoxicillin; and Sulfa antibiotics   Social History   Social History  . Marital Status: Divorced    Spouse Name: N/A  . Number of Children: N/A  . Years of Education: N/A   Social History Main Topics  . Smoking status: Never Smoker   . Smokeless tobacco: Never Used  . Alcohol Use: No  . Drug Use: No  . Sexual Activity: Not Asked   Other Topics Concern  . None   Social History Narrative     Family History:  The patient's family history includes Heart attack in her father and mother; Heart disease in her father and mother; Hypertension in her father and mother.   ROS:   Please see the history of present illness.    Review of Systems  HENT: Positive for headaches.    All other systems reviewed and are negative.   PHYSICAL EXAM:   VS:  BP 126/64 mmHg  Pulse 72  Ht 5\' 1"  (1.549 m)  Wt 211 lb 1.9 oz (95.763 kg)  BMI 39.91 kg/m2   GEN: Well nourished, well developed, in no acute distress HEENT: normal Neck: no JVD, carotid bruits, or masses Cardiac: RRR; no murmurs, rubs, or gallops,no edema.  Intact distal pulses bilaterally.  Respiratory:  clear to auscultation bilaterally, normal work of breathing GI: soft, nontender, nondistended, + BS MS: no deformity or atrophy Skin: warm and dry, no rash Neuro:  Alert and Oriented x 3, Strength and sensation are intact Psych: euthymic mood, full affect  Wt Readings from Last 3 Encounters:  02/26/16 211 lb 1.9 oz (95.763 kg)  01/11/16 210 lb 9.6 oz (95.528 kg)  12/04/15 207 lb 3.7 oz (94 kg)        Studies/Labs Reviewed:   EKG:  EKG is not ordered today.    Recent Labs: 11/26/2015: BUN 15; Creat 0.73; Hemoglobin 14.3; Platelets 163; Potassium 3.5; Sodium 138   Lipid Panel No results found for: CHOL, TRIG, HDL, CHOLHDL, VLDL, LDLCALC, LDLDIRECT  Additional studies/ records that were reviewed today include:  none    ASSESSMENT:    1. SVT (supraventricular tachycardia) ([place])   2. Essential (primary) hypertension   3.  Chest pain, unspecified chest pain type   4. OSA (obstructive sleep apnea)      PLAN:  In order of problems listed above:  1. SVT s/p AVNRT ablation with no further palpitations. 2. HTN - BP controlled on current medical regimen. Continue Hyzaar 3. Noncardiac chest pain with normal coronary arteries and LVEDP at cath 09/2015.   OSA - the patient is tolerating PAP therapy well without any problems. The PAP download was reviewed today and showed an AHI of 3.3/hr on 14 cm H2O with 97% compliance in using more than 4 hours nightly.  The patient has been using and benefiting from CPAP use and will continue to benefit from therapy.    Followup with me in 1 year  Medication Adjustments/Labs and Tests Ordered: Current medicines are reviewed at length with the patient today.  Concerns regarding medicines are outlined above.  Medication changes, Labs and Tests ordered today are listed in the Patient Instructions below.  Patient Instructions  Medication Instructions:  Your physician recommends that you continue on your current medications as directed. Please refer to the Current Medication list given to you today.   Labwork: None  Testing/Procedures: None  Follow-Up: Your physician wants you to follow-up in: 1 year with Dr. Radford Pax. You will receive a reminder letter in the mail two months in advance. If you don't receive a letter, please call our office to schedule the follow-up appointment.   Any Other Special Instructions Will Be Listed Below (If  Applicable).     If you need a refill on your cardiac medications before your next appointment, please call your pharmacy.       Signed, Fransico Him, MD  04/22/2016 1:30 PM    [facility] [address], [place], [place]  [address] Phone: [phone]; Fax: [phone]

## 2016-02-26 NOTE — Patient Instructions (Signed)

## 2016-03-06 ENCOUNTER — Encounter: Payer: Self-pay | Admitting: Cardiology

## 2016-04-09 DIAGNOSIS — L932 Other local lupus erythematosus: Secondary | ICD-10-CM | POA: Diagnosis not present

## 2016-04-09 DIAGNOSIS — M797 Fibromyalgia: Secondary | ICD-10-CM | POA: Diagnosis not present

## 2016-04-09 DIAGNOSIS — M159 Polyosteoarthritis, unspecified: Secondary | ICD-10-CM | POA: Diagnosis not present

## 2016-04-09 DIAGNOSIS — Z79899 Other long term (current) drug therapy: Secondary | ICD-10-CM | POA: Diagnosis not present

## 2016-04-22 ENCOUNTER — Telehealth: Payer: Self-pay | Admitting: Cardiology

## 2016-04-22 ENCOUNTER — Encounter: Payer: Self-pay | Admitting: Cardiology

## 2016-04-22 DIAGNOSIS — H43813 Vitreous degeneration, bilateral: Secondary | ICD-10-CM | POA: Diagnosis not present

## 2016-04-22 DIAGNOSIS — G4733 Obstructive sleep apnea (adult) (pediatric): Secondary | ICD-10-CM

## 2016-04-22 DIAGNOSIS — Z79899 Other long term (current) drug therapy: Secondary | ICD-10-CM | POA: Diagnosis not present

## 2016-04-22 DIAGNOSIS — Z961 Presence of intraocular lens: Secondary | ICD-10-CM | POA: Diagnosis not present

## 2016-04-22 DIAGNOSIS — Z049 Encounter for examination and observation for unspecified reason: Secondary | ICD-10-CM | POA: Diagnosis not present

## 2016-04-22 DIAGNOSIS — M328 Other forms of systemic lupus erythematosus: Secondary | ICD-10-CM | POA: Diagnosis not present

## 2016-04-22 DIAGNOSIS — Z9989 Dependence on other enabling machines and devices: Secondary | ICD-10-CM | POA: Insufficient documentation

## 2016-04-22 DIAGNOSIS — Z09 Encounter for follow-up examination after completed treatment for conditions other than malignant neoplasm: Secondary | ICD-10-CM | POA: Diagnosis not present

## 2016-04-22 HISTORY — DX: Obstructive sleep apnea (adult) (pediatric): G47.33

## 2016-04-22 NOTE — Telephone Encounter (Signed)
New message   Pt verbalized that her C-PAP machine has been working great. She is requesting a written note by Dr.Turner that states she agrees that it has been working.  Pt verbalized that her insurance wont allow her to continue without approval.  She is wanting to know can she have a document or an order so that she can keep her C-PAP machine.

## 2016-04-22 NOTE — Telephone Encounter (Signed)
Routing message to Dr. Radford Pax.   Office visit note from last visit was a 10 week follow-up so I will see if Dr. Radford Pax can edit her note to include OSA treatment/benefits.     Once edited, note can be sent to [facility] to send to insurance.  I have left a message for patient to call so that I can tell her it will be sent.

## 2016-04-22 NOTE — Telephone Encounter (Signed)
Note faxed to [facility].  Patient is aware and thankful for the call to confirm.

## 2016-04-22 NOTE — Telephone Encounter (Signed)
Note has been addended with sleep data

## 2016-04-28 DIAGNOSIS — G4733 Obstructive sleep apnea (adult) (pediatric): Secondary | ICD-10-CM | POA: Diagnosis not present

## 2016-05-14 DIAGNOSIS — M159 Polyosteoarthritis, unspecified: Secondary | ICD-10-CM | POA: Diagnosis not present

## 2016-05-14 DIAGNOSIS — Z79899 Other long term (current) drug therapy: Secondary | ICD-10-CM | POA: Diagnosis not present

## 2016-05-14 DIAGNOSIS — L932 Other local lupus erythematosus: Secondary | ICD-10-CM | POA: Diagnosis not present

## 2016-05-14 DIAGNOSIS — M797 Fibromyalgia: Secondary | ICD-10-CM | POA: Diagnosis not present

## 2016-05-19 DIAGNOSIS — G43109 Migraine with aura, not intractable, without status migrainosus: Secondary | ICD-10-CM | POA: Diagnosis not present

## 2016-05-19 DIAGNOSIS — Z0000 Encounter for general adult medical examination without abnormal findings: Secondary | ICD-10-CM | POA: Diagnosis not present

## 2016-05-19 DIAGNOSIS — Z1389 Encounter for screening for other disorder: Secondary | ICD-10-CM | POA: Diagnosis not present

## 2016-05-19 DIAGNOSIS — J309 Allergic rhinitis, unspecified: Secondary | ICD-10-CM | POA: Diagnosis not present

## 2016-05-19 DIAGNOSIS — R7303 Prediabetes: Secondary | ICD-10-CM | POA: Diagnosis not present

## 2016-05-19 DIAGNOSIS — D649 Anemia, unspecified: Secondary | ICD-10-CM | POA: Diagnosis not present

## 2016-05-19 DIAGNOSIS — I10 Essential (primary) hypertension: Secondary | ICD-10-CM | POA: Diagnosis not present

## 2016-05-19 DIAGNOSIS — M1990 Unspecified osteoarthritis, unspecified site: Secondary | ICD-10-CM | POA: Diagnosis not present

## 2016-05-19 DIAGNOSIS — K219 Gastro-esophageal reflux disease without esophagitis: Secondary | ICD-10-CM | POA: Diagnosis not present

## 2016-05-19 DIAGNOSIS — E7800 Pure hypercholesterolemia, unspecified: Secondary | ICD-10-CM | POA: Diagnosis not present

## 2016-05-19 DIAGNOSIS — Z131 Encounter for screening for diabetes mellitus: Secondary | ICD-10-CM | POA: Diagnosis not present

## 2016-05-19 DIAGNOSIS — G609 Hereditary and idiopathic neuropathy, unspecified: Secondary | ICD-10-CM | POA: Diagnosis not present

## 2016-05-29 DIAGNOSIS — G4733 Obstructive sleep apnea (adult) (pediatric): Secondary | ICD-10-CM | POA: Diagnosis not present

## 2016-06-06 DIAGNOSIS — M25562 Pain in left knee: Secondary | ICD-10-CM | POA: Diagnosis not present

## 2016-06-29 DIAGNOSIS — G4733 Obstructive sleep apnea (adult) (pediatric): Secondary | ICD-10-CM | POA: Diagnosis not present

## 2016-07-29 DIAGNOSIS — G4733 Obstructive sleep apnea (adult) (pediatric): Secondary | ICD-10-CM | POA: Diagnosis not present

## 2016-08-29 DIAGNOSIS — G4733 Obstructive sleep apnea (adult) (pediatric): Secondary | ICD-10-CM | POA: Diagnosis not present

## 2016-09-02 ENCOUNTER — Other Ambulatory Visit (INDEPENDENT_AMBULATORY_CARE_PROVIDER_SITE_OTHER): Payer: Self-pay | Admitting: Family

## 2016-09-23 ENCOUNTER — Encounter (HOSPITAL_COMMUNITY)
Admission: RE | Admit: 2016-09-23 | Discharge: 2016-09-23 | Disposition: A | Discharge status: Home / Self Care | Payer: PPO | Source: Ambulatory Visit | Attending: Orthopaedic Surgery | Admitting: Orthopaedic Surgery

## 2016-09-23 ENCOUNTER — Encounter (HOSPITAL_COMMUNITY): Payer: Self-pay

## 2016-09-23 ENCOUNTER — Other Ambulatory Visit (HOSPITAL_COMMUNITY): Payer: Self-pay | Admitting: *Deleted

## 2016-09-23 DIAGNOSIS — Z01812 Encounter for preprocedural laboratory examination: Secondary | ICD-10-CM | POA: Diagnosis not present

## 2016-09-23 HISTORY — DX: Polyneuropathy, unspecified: G62.9

## 2016-09-23 HISTORY — DX: Major depressive disorder, single episode, unspecified: F32.9

## 2016-09-23 HISTORY — DX: Unspecified osteoarthritis, unspecified site: M19.90

## 2016-09-23 HISTORY — DX: Restless legs syndrome: G25.81

## 2016-09-23 HISTORY — DX: Depression, unspecified: F32.A

## 2016-09-23 HISTORY — DX: Unspecified macular degeneration: H35.30

## 2016-09-23 HISTORY — DX: Chronic obstructive pulmonary disease, unspecified: J44.9

## 2016-09-23 LAB — URINALYSIS, ROUTINE W REFLEX MICROSCOPIC
BILIRUBIN URINE: NEGATIVE
GLUCOSE, UA: NEGATIVE mg/dL
HGB URINE DIPSTICK: NEGATIVE
KETONES UR: NEGATIVE mg/dL
Leukocytes, UA: NEGATIVE
Nitrite: NEGATIVE
PH: 6.5 (ref 5.0–8.0)
Protein, ur: NEGATIVE mg/dL
SPECIFIC GRAVITY, URINE: 1.015 (ref 1.005–1.030)

## 2016-09-23 LAB — CBC
HEMATOCRIT: 43.2 % (ref 36.0–46.0)
Hemoglobin: 14.7 g/dL (ref 12.0–15.0)
MCH: 31.5 pg (ref 26.0–34.0)
MCHC: 34 g/dL (ref 30.0–36.0)
MCV: 92.5 fL (ref 78.0–100.0)
PLATELETS: 176 10*3/uL (ref 150–400)
RBC: 4.67 MIL/uL (ref 3.87–5.11)
RDW: 13.1 % (ref 11.5–15.5)
WBC: 6.8 10*3/uL (ref 4.0–10.5)

## 2016-09-23 LAB — BASIC METABOLIC PANEL
Anion gap: 9 (ref 5–15)
BUN: 11 mg/dL (ref 6–20)
CHLORIDE: 106 mmol/L (ref 101–111)
CO2: 27 mmol/L (ref 22–32)
CREATININE: 0.7 mg/dL (ref 0.44–1.00)
Calcium: 9.3 mg/dL (ref 8.9–10.3)
GFR calc non Af Amer: >60 mL/min (ref >60)
GLUCOSE: 121 mg/dL — AB (ref 65–99)
Potassium: 3.6 mmol/L (ref 3.5–5.1)
Sodium: 142 mmol/L (ref 135–145)

## 2016-09-23 LAB — SURGICAL PCR SCREEN
MRSA, PCR: NEGATIVE
Staphylococcus aureus: NEGATIVE

## 2016-09-23 NOTE — Pre-Procedure Instructions (Signed)
Jessica Watts  09/23/2016    Your procedure is scheduled on Wednesday, October 01, 2016 at 12:30 PM.   Report to [facility] Entrance "A" Admitting Office at 10:30 AM.   Call this number if you have problems the morning of surgery: [phone]   Questions prior to day of surgery, please call [phone] between 8 & 4 PM.   Remember:  Do not eat food or drink liquids after midnight Tuesday, 09/30/16.  Take these medicines the morning of surgery with A SIP OF WATER: Omeprazole (Prilosec), Flonase - if needed, Albuterol (Pro-Air) inhaler - if needed (bring inhaler with you morning of surgery), eye drops - if needed  Stop Aspirin and Vitamin E 7 days prior to surgery. Do not use NSAIDS (Ibuprofen, Aleve, etc) 7 days prior to surgery.   Do not wear jewelry, make-up or nail polish.  Do not wear lotions, powders, or perfumes.  Do not shave 48 hours prior to surgery.    Do not bring valuables to the hospital.  [name] is not responsible for any belongings or valuables.  Contacts, dentures or bridgework may not be worn into surgery.  Leave your suitcase in the car.  After surgery it may be brought to your room.  For patients admitted to the hospital, discharge time will be determined by your treatment team.  Special instructions:  [name] - Preparing for Surgery  Before surgery, you can play an important role.  Because skin is not sterile, your skin needs to be as free of germs as possible.  You can reduce the number of germs on you skin by washing with CHG (chlorahexidine gluconate) soap before surgery.  CHG is an antiseptic cleaner which kills germs and bonds with the skin to continue killing germs even after washing.  Please DO NOT use if you have an allergy to CHG or antibacterial soaps.  If your skin becomes reddened/irritated stop using the CHG and inform your nurse when you arrive at Short Stay.  Do not shave (including legs and underarms) for at least 48  hours prior to the first CHG shower.  You may shave your face.  Please follow these instructions carefully:   1.  Shower with CHG Soap the night before surgery and the                    morning of Surgery.  2.  If you choose to wash your hair, wash your hair first as usual with your       normal shampoo.  3.  After you shampoo, rinse your hair and body thoroughly to remove the shampoo.  4.  Use CHG as you would any other liquid soap.  You can apply chg directly       to the skin and wash gently with scrungie or a clean washcloth.  5.  Apply the CHG Soap to your body ONLY FROM THE NECK DOWN.        Do not use on open wounds or open sores.  Avoid contact with your eyes, ears, mouth and genitals (private parts).  Wash genitals (private parts) with your normal soap.  6.  Wash thoroughly, paying special attention to the area where your surgery        will be performed.  7.  Thoroughly rinse your body with warm water from the neck down.  8.  DO NOT shower/wash with your normal soap after using and rinsing off  the CHG Soap.  9.  Pat yourself dry with a clean towel.            10.  Wear clean pajamas.            11.  Place clean sheets on your bed the night of your first shower and do not        sleep with pets.  Day of Surgery  Do not apply any lotions the morning of surgery.  Please wear clean clothes to the hospital.   Please read over the fact sheets that you were given.

## 2016-09-23 NOTE — Progress Notes (Signed)
Pt denies cardiac history, chest pain or sob. Pt states she is not diabetic. 

## 2016-09-24 NOTE — Progress Notes (Signed)
Preop labs

## 2016-09-28 DIAGNOSIS — G4733 Obstructive sleep apnea (adult) (pediatric): Secondary | ICD-10-CM | POA: Diagnosis not present

## 2016-09-30 NOTE — H&P (Signed)
TOTAL KNEE ADMISSION H&P  Patient is being admitted for left total knee arthroplasty.  Subjective:  Chief Complaint:left knee pain.  HPI: Jessica Watts, 70 y.o. female, has a history of pain and functional disability in the left knee due to arthritis and has failed non-surgical conservative treatments for greater than 12 weeks to includeNSAID's and/or analgesics, corticosteriod injections, use of assistive devices, weight reduction as appropriate and activity modification.  Onset of symptoms was gradual, starting 10 years ago with gradually worsening course since that time.   Patient currently rates pain in the left knee(s) at 10 out of 10 with activity. Patient has night pain, worsening of pain with activity and weight bearing, pain that interferes with activities of daily living, crepitus and joint swelling.  Patient has evidence of subchondral sclerosis, periarticular osteophytes and joint space narrowing by imaging studies. . There is no active infection.  Patient Active Problem List   Diagnosis Date Noted  . OSA (obstructive sleep apnea) 04/22/2016  . SVT (supraventricular tachycardia) ([place]) 11/08/2015  . SOB (shortness of breath) 08/23/2015  . Allergic rhinitis 08/22/2015  . Absolute anemia 08/22/2015  . Essential (primary) hypertension 08/22/2015  . Gastro-esophageal reflux disease without esophagitis 08/22/2015  . Hereditary and idiopathic neuropathy 08/22/2015  . Migraine with typical aura 08/22/2015  . Arthritis, degenerative 08/22/2015  . Borderline diabetes mellitus 08/22/2015  . Pure hypercholesterolemia 08/22/2015  . Systemic sclerosis ([place]) 08/22/2015  . Fast heart beat 08/22/2015  . Absence of bladder continence 08/22/2015  . Apnea, sleep 07/16/2015  . Cataract 09/26/2013  . Cataract of left eye 08/29/2013  . Cervical radiculopathy 07/20/2012  . Ulnar neuropathy at elbow 07/20/2012   Past Medical History:  Diagnosis Date  . Arthritis   . Asthma   .  Complication of anesthesia    has difficulties related to being Native [place]; multiple days in hospital after hysterectomy years ago  . COPD (chronic obstructive pulmonary disease) ([place])   . Depression    in the past  . Fibromyalgia   . GERD (gastroesophageal reflux disease)   . Headache(784.0)    migraines  . Hyperlipidemia   . Hypertension   . Lupus   . Macular degeneration    left eye  . Migraine   . Neuropathy ([place])   . OSA (obstructive sleep apnea) 04/22/2016   Moderate with AHI 16/hr on CPAP  . Peptic ulcer disease   . Restless legs   . Shortness of breath dyspnea   . Sleep apnea    last sleep study 2000, not using CPAP  . SVT (supraventricular tachycardia) ([place]) 12/03/2015   ablation for SVT    Past Surgical History:  Procedure Laterality Date  . ABDOMINAL HYSTERECTOMY    . ABLATION OF DYSRHYTHMIC FOCUS  12/03/2015   SVT  . ANTERIOR CERVICAL DECOMP/DISCECTOMY FUSION  07/20/2012   Procedure: ANTERIOR CERVICAL DECOMPRESSION/DISCECTOMY FUSION 2 LEVELS;  Surgeon: Charlie Pitter, MD;  Location: [facility];  Service: Neurosurgery;  Laterality: N/A;  cervical three- four , four -  five  . APPENDECTOMY    . BACK SURGERY     lumbar fusion  . CARDIAC CATHETERIZATION N/A 09/14/2015   Procedure: Left Heart Cath and Coronary Angiography;  Surgeon: Sherren Mocha, MD;  Location: [facility];  Service: Cardiovascular;  Laterality: N/A;  . CARPAL TUNNEL RELEASE     bilat  . CERVICAL FUSION     C2/3  . ELECTROPHYSIOLOGIC STUDY N/A 12/03/2015   Procedure: SVT Ablation;  Surgeon: Champ Mungo  Lovena Le, MD;  Location: [facility];  Service: Cardiovascular;  Laterality: N/A;  . EYE SURGERY Bilateral    cataract surgery with lens implant  . KNEE ARTHROSCOPY Left   . NEUROMA SURGERY     L foot  . SHOULDER SURGERY    . TONSILLECTOMY    . ULNAR NERVE TRANSPOSITION  07/20/2012   Procedure: ULNAR NERVE DECOMPRESSION/TRANSPOSITION;  Surgeon: Charlie Pitter, MD;  Location: [facility];  Service: Neurosurgery;  Laterality: Left;    No prescriptions prior to admission.   Allergies  Allergen Reactions  . Ace Inhibitors Cough  . Amoxicillin Other (See Comments)    Chills and body aches Has patient had a PCN reaction causing immediate rash, facial/tongue/throat swelling, SOB or lightheadedness with hypotension: No Has patient had a PCN reaction causing severe rash involving mucus membranes or skin necrosis: No Has patient had a PCN reaction that required hospitalization: No Has patient had a PCN reaction occurring within the last 10 years: No If all of the above answers are "NO", then may proceed with Cephalosporin use.   . Sulfa Antibiotics Swelling and Other (See Comments)    Had eye swelling and pain after using eye drops with sulfa component    Social History  Substance Use Topics  . Smoking status: Never Smoker  . Smokeless tobacco: Never Used  . Alcohol use Yes     Comment: occasional wine    Family History  Problem Relation Age of Onset  . Heart disease Mother   . Heart attack Mother   . Hypertension Mother   . Heart attack Father   . Heart disease Father   . Hypertension Father      Review of Systems  Constitutional: Negative.   Respiratory: Negative.   Cardiovascular: Negative.   Gastrointestinal: Negative.   Musculoskeletal: Positive for joint pain.  Skin: Negative.   Neurological: Negative.   Psychiatric/Behavioral: Negative.     Objective:  Physical Exam  Constitutional: She is oriented to person, place, and time. She appears well-developed. No distress.  HENT:  Head: Normocephalic and atraumatic.  Eyes: EOM are normal. Pupils are equal, round, and reactive to light.  Neck: Normal range of motion.  Respiratory: No respiratory distress.  GI: She exhibits no distension.  Musculoskeletal: She exhibits tenderness.  Nontender. Positive crepitus. Some swelling with small effusion.  Neurological: She is alert and oriented to person,  place, and time.  Skin: Skin is warm and dry.    Vital signs in last 24 hours:    Labs:   Estimated body mass index is 39.32 kg/m as calculated from the following:   Height as of 09/23/16: 5' 1.5" (1.562 m).   Weight as of 09/23/16: 211 lb 8 oz (95.9 kg).   Imaging Review Plain radiographs demonstrate moderate degenerative joint disease of the left knee(s). The overall alignment ismild varus. The bone quality appears to be good for age and reported activity level.  Assessment/Plan:  End stage arthritis, left knee   The patient history, physical examination, clinical judgment of the provider and imaging studies are consistent with end stage degenerative joint disease of the left knee(s) and total knee arthroplasty is deemed medically necessary. The treatment options including medical management, injection therapy arthroscopy and arthroplasty were discussed at length. The risks and benefits of total knee arthroplasty were presented and reviewed. The risks due to aseptic loosening, infection, stiffness, patella tracking problems, thromboembolic complications and other imponderables were discussed. The patient acknowledged the explanation, agreed  to proceed with the plan and consent was signed. Patient is being admitted for inpatient treatment for surgery, pain control, PT, OT, prophylactic antibiotics, VTE prophylaxis, progressive ambulation and ADL's and discharge planning. The patient is planning to be discharged home with home health services

## 2016-10-01 ENCOUNTER — Inpatient Hospital Stay (HOSPITAL_COMMUNITY): Payer: PPO

## 2016-10-01 ENCOUNTER — Inpatient Hospital Stay (HOSPITAL_COMMUNITY)
Admission: RE | Admit: 2016-10-01 | Discharge: 2016-10-04 | DRG: 470 | Disposition: A | Discharge status: Home Health | Payer: PPO | Source: Ambulatory Visit | Attending: Orthopaedic Surgery | Admitting: Orthopaedic Surgery

## 2016-10-01 ENCOUNTER — Encounter (HOSPITAL_COMMUNITY)
Admission: RE | Disposition: A | Discharge status: Home Health | Payer: Self-pay | Source: Ambulatory Visit | Attending: Orthopaedic Surgery

## 2016-10-01 ENCOUNTER — Inpatient Hospital Stay (HOSPITAL_COMMUNITY): Payer: PPO | Admitting: Certified Registered"

## 2016-10-01 ENCOUNTER — Encounter (HOSPITAL_COMMUNITY): Payer: Self-pay | Admitting: *Deleted

## 2016-10-01 DIAGNOSIS — Z09 Encounter for follow-up examination after completed treatment for conditions other than malignant neoplasm: Secondary | ICD-10-CM

## 2016-10-01 DIAGNOSIS — H3530 Unspecified macular degeneration: Secondary | ICD-10-CM | POA: Diagnosis present

## 2016-10-01 DIAGNOSIS — Z888 Allergy status to other drugs, medicaments and biological substances status: Secondary | ICD-10-CM

## 2016-10-01 DIAGNOSIS — E7800 Pure hypercholesterolemia, unspecified: Secondary | ICD-10-CM | POA: Diagnosis present

## 2016-10-01 DIAGNOSIS — M797 Fibromyalgia: Secondary | ICD-10-CM | POA: Diagnosis not present

## 2016-10-01 DIAGNOSIS — M349 Systemic sclerosis, unspecified: Secondary | ICD-10-CM | POA: Diagnosis present

## 2016-10-01 DIAGNOSIS — Z882 Allergy status to sulfonamides status: Secondary | ICD-10-CM | POA: Diagnosis not present

## 2016-10-01 DIAGNOSIS — J449 Chronic obstructive pulmonary disease, unspecified: Secondary | ICD-10-CM | POA: Diagnosis not present

## 2016-10-01 DIAGNOSIS — K219 Gastro-esophageal reflux disease without esophagitis: Secondary | ICD-10-CM | POA: Diagnosis present

## 2016-10-01 DIAGNOSIS — Z9842 Cataract extraction status, left eye: Secondary | ICD-10-CM | POA: Diagnosis not present

## 2016-10-01 DIAGNOSIS — Z961 Presence of intraocular lens: Secondary | ICD-10-CM | POA: Diagnosis present

## 2016-10-01 DIAGNOSIS — G2581 Restless legs syndrome: Secondary | ICD-10-CM | POA: Diagnosis not present

## 2016-10-01 DIAGNOSIS — G43909 Migraine, unspecified, not intractable, without status migrainosus: Secondary | ICD-10-CM | POA: Diagnosis not present

## 2016-10-01 DIAGNOSIS — I10 Essential (primary) hypertension: Secondary | ICD-10-CM | POA: Diagnosis present

## 2016-10-01 DIAGNOSIS — Z880 Allergy status to penicillin: Secondary | ICD-10-CM

## 2016-10-01 DIAGNOSIS — Z96652 Presence of left artificial knee joint: Secondary | ICD-10-CM | POA: Diagnosis not present

## 2016-10-01 DIAGNOSIS — Z8249 Family history of ischemic heart disease and other diseases of the circulatory system: Secondary | ICD-10-CM | POA: Diagnosis not present

## 2016-10-01 DIAGNOSIS — M1712 Unilateral primary osteoarthritis, left knee: Principal | ICD-10-CM

## 2016-10-01 DIAGNOSIS — G4733 Obstructive sleep apnea (adult) (pediatric): Secondary | ICD-10-CM | POA: Diagnosis not present

## 2016-10-01 DIAGNOSIS — Z9841 Cataract extraction status, right eye: Secondary | ICD-10-CM

## 2016-10-01 DIAGNOSIS — Z981 Arthrodesis status: Secondary | ICD-10-CM

## 2016-10-01 DIAGNOSIS — J309 Allergic rhinitis, unspecified: Secondary | ICD-10-CM | POA: Diagnosis not present

## 2016-10-01 DIAGNOSIS — Z471 Aftercare following joint replacement surgery: Secondary | ICD-10-CM | POA: Diagnosis not present

## 2016-10-01 DIAGNOSIS — M25562 Pain in left knee: Secondary | ICD-10-CM | POA: Diagnosis not present

## 2016-10-01 DIAGNOSIS — G8918 Other acute postprocedural pain: Secondary | ICD-10-CM | POA: Diagnosis not present

## 2016-10-01 DIAGNOSIS — R269 Unspecified abnormalities of gait and mobility: Secondary | ICD-10-CM | POA: Diagnosis not present

## 2016-10-01 HISTORY — PX: TOTAL KNEE ARTHROPLASTY: SHX125

## 2016-10-01 SURGERY — ARTHROPLASTY, KNEE, TOTAL
Anesthesia: Spinal | Site: Knee | Laterality: Left

## 2016-10-01 MED ORDER — ACETAMINOPHEN 650 MG RE SUPP: 650.0000 mg | Freq: Four times a day (QID) | RECTAL | Status: DC | PRN

## 2016-10-01 MED ORDER — FENTANYL CITRATE (PF) 100 MCG/2ML IJ SOLN
INTRAMUSCULAR | Status: AC
  Administered 2016-10-01: 50 ug via INTRAVENOUS
  Filled 2016-10-01: qty 2

## 2016-10-01 MED ORDER — MIDAZOLAM HCL 2 MG/2ML IJ SOLN
INTRAMUSCULAR | Status: AC
  Filled 2016-10-01: qty 2

## 2016-10-01 MED ORDER — HYDROMORPHONE HCL 2 MG/ML IJ SOLN
0.5000 mg | INTRAMUSCULAR | Status: DC | PRN
  Administered 2016-10-01 – 2016-10-02 (×2): 0.5 mg via INTRAVENOUS
  Filled 2016-10-01 (×2): qty 1

## 2016-10-01 MED ORDER — CLINDAMYCIN PHOSPHATE 900 MG/50ML IV SOLN
900.0000 mg | INTRAVENOUS | Status: AC
  Administered 2016-10-01: 900 mg via INTRAVENOUS

## 2016-10-01 MED ORDER — PROPOFOL 1000 MG/100ML IV EMUL
INTRAVENOUS | Status: AC
  Filled 2016-10-01: qty 200

## 2016-10-01 MED ORDER — LIDOCAINE HCL (CARDIAC) 20 MG/ML IV SOLN
INTRAVENOUS | Status: DC | PRN
  Administered 2016-10-01: 100 mg via INTRATRACHEAL

## 2016-10-01 MED ORDER — ONDANSETRON HCL 4 MG PO TABS: 4.0000 mg | ORAL_TABLET | Freq: Four times a day (QID) | ORAL | Status: DC | PRN

## 2016-10-01 MED ORDER — FLUTICASONE PROPIONATE 50 MCG/ACT NA SUSP
1.0000 | Freq: Every day | NASAL | Status: DC | PRN
  Filled 2016-10-01: qty 16

## 2016-10-01 MED ORDER — PHENOL 1.4 % MT LIQD: 1.0000 | OROMUCOSAL | Status: DC | PRN

## 2016-10-01 MED ORDER — LOSARTAN POTASSIUM 50 MG PO TABS
100.0000 mg | ORAL_TABLET | Freq: Every day | ORAL | Status: DC
  Administered 2016-10-02 – 2016-10-03 (×2): 100 mg via ORAL
  Filled 2016-10-01 (×2): qty 2

## 2016-10-01 MED ORDER — HYDROCHLOROTHIAZIDE 12.5 MG PO CAPS
12.5000 mg | ORAL_CAPSULE | Freq: Every day | ORAL | Status: DC
  Administered 2016-10-02 – 2016-10-03 (×2): 12.5 mg via ORAL
  Filled 2016-10-01 (×2): qty 1

## 2016-10-01 MED ORDER — BUPIVACAINE LIPOSOME 1.3 % IJ SUSP
20.0000 mL | Freq: Once | INTRAMUSCULAR | Status: DC
  Filled 2016-10-01: qty 20

## 2016-10-01 MED ORDER — MIDAZOLAM HCL 2 MG/2ML IJ SOLN
1.0000 mg | INTRAMUSCULAR | Status: DC | PRN
Start: 2016-10-01 — End: 2016-10-01
  Administered 2016-10-01: 1 mg via INTRAVENOUS

## 2016-10-01 MED ORDER — BUPIVACAINE HCL (PF) 0.25 % IJ SOLN
INTRAMUSCULAR | Status: DC | PRN
  Administered 2016-10-01: 30 mL

## 2016-10-01 MED ORDER — METOCLOPRAMIDE HCL 5 MG PO TABS: 5.0000 mg | ORAL_TABLET | Freq: Three times a day (TID) | ORAL | Status: DC | PRN

## 2016-10-01 MED ORDER — FENTANYL CITRATE (PF) 100 MCG/2ML IJ SOLN
INTRAMUSCULAR | Status: DC | PRN
  Administered 2016-10-01 (×2): 50 ug via INTRAVENOUS

## 2016-10-01 MED ORDER — FENTANYL CITRATE (PF) 100 MCG/2ML IJ SOLN: 25.0000 ug | INTRAMUSCULAR | Status: DC | PRN

## 2016-10-01 MED ORDER — CLINDAMYCIN PHOSPHATE 900 MG/50ML IV SOLN
INTRAVENOUS | Status: AC
  Filled 2016-10-01: qty 50

## 2016-10-01 MED ORDER — PROPOFOL 500 MG/50ML IV EMUL
INTRAVENOUS | Status: DC | PRN
  Administered 2016-10-01: 75 ug/kg/min via INTRAVENOUS

## 2016-10-01 MED ORDER — OXYCODONE HCL 5 MG PO TABS
5.0000 mg | ORAL_TABLET | ORAL | Status: DC | PRN
  Administered 2016-10-01 – 2016-10-03 (×7): 10 mg via ORAL
  Filled 2016-10-01 (×7): qty 2

## 2016-10-01 MED ORDER — LOSARTAN POTASSIUM-HCTZ 100-12.5 MG PO TABS: 1.0000 | ORAL_TABLET | Freq: Every day | ORAL | Status: DC

## 2016-10-01 MED ORDER — LACTATED RINGERS IV SOLN
INTRAVENOUS | Status: DC
  Administered 2016-10-01 (×2): via INTRAVENOUS

## 2016-10-01 MED ORDER — FENTANYL CITRATE (PF) 100 MCG/2ML IJ SOLN
INTRAMUSCULAR | Status: AC
  Filled 2016-10-01: qty 2

## 2016-10-01 MED ORDER — METHOCARBAMOL 500 MG PO TABS
500.0000 mg | ORAL_TABLET | Freq: Four times a day (QID) | ORAL | Status: DC | PRN
  Administered 2016-10-02 – 2016-10-03 (×4): 500 mg via ORAL
  Filled 2016-10-01 (×4): qty 1

## 2016-10-01 MED ORDER — PHENYLEPHRINE 40 MCG/ML (10ML) SYRINGE FOR IV PUSH (FOR BLOOD PRESSURE SUPPORT)
PREFILLED_SYRINGE | INTRAVENOUS | Status: AC
  Filled 2016-10-01: qty 10

## 2016-10-01 MED ORDER — POLYETHYLENE GLYCOL 3350 17 G PO PACK
17.0000 g | PACK | Freq: Every day | ORAL | Status: DC | PRN
  Administered 2016-10-02: 17 g via ORAL
  Filled 2016-10-01: qty 1

## 2016-10-01 MED ORDER — CYCLOSPORINE 0.05 % OP EMUL
1.0000 [drp] | Freq: Two times a day (BID) | OPHTHALMIC | Status: DC | PRN
  Filled 2016-10-01: qty 1

## 2016-10-01 MED ORDER — MENTHOL 3 MG MT LOZG: 1.0000 | LOZENGE | OROMUCOSAL | Status: DC | PRN

## 2016-10-01 MED ORDER — BUPIVACAINE LIPOSOME 1.3 % IJ SUSP
INTRAMUSCULAR | Status: DC | PRN
  Administered 2016-10-01: 20 mL

## 2016-10-01 MED ORDER — METHOCARBAMOL 1000 MG/10ML IJ SOLN
500.0000 mg | Freq: Four times a day (QID) | INTRAVENOUS | Status: DC | PRN
  Filled 2016-10-01: qty 5

## 2016-10-01 MED ORDER — BUPIVACAINE HCL (PF) 0.25 % IJ SOLN
INTRAMUSCULAR | Status: AC
  Filled 2016-10-01: qty 30

## 2016-10-01 MED ORDER — 0.9 % SODIUM CHLORIDE (POUR BTL) OPTIME
TOPICAL | Status: DC | PRN
  Administered 2016-10-01: 1000 mL

## 2016-10-01 MED ORDER — MIDAZOLAM HCL 5 MG/5ML IJ SOLN
INTRAMUSCULAR | Status: DC | PRN
  Administered 2016-10-01: 2 mg via INTRAVENOUS

## 2016-10-01 MED ORDER — PHENYLEPHRINE HCL 10 MG/ML IJ SOLN
INTRAVENOUS | Status: DC | PRN
  Administered 2016-10-01: 30 ug/min via INTRAVENOUS

## 2016-10-01 MED ORDER — POTASSIUM CHLORIDE CRYS ER 20 MEQ PO TBCR
20.0000 meq | EXTENDED_RELEASE_TABLET | Freq: Every day | ORAL | Status: DC
  Administered 2016-10-02 – 2016-10-03 (×2): 20 meq via ORAL
  Filled 2016-10-01 (×2): qty 1

## 2016-10-01 MED ORDER — MIDAZOLAM HCL 2 MG/2ML IJ SOLN
INTRAMUSCULAR | Status: AC
  Administered 2016-10-01: 1 mg via INTRAVENOUS
  Filled 2016-10-01: qty 2

## 2016-10-01 MED ORDER — METOCLOPRAMIDE HCL 5 MG/ML IJ SOLN: 5.0000 mg | Freq: Three times a day (TID) | INTRAMUSCULAR | Status: DC | PRN

## 2016-10-01 MED ORDER — CHLORHEXIDINE GLUCONATE 4 % EX LIQD: 60.0000 mL | Freq: Once | CUTANEOUS | Status: DC

## 2016-10-01 MED ORDER — ALBUTEROL SULFATE (2.5 MG/3ML) 0.083% IN NEBU: 3.0000 mL | INHALATION_SOLUTION | RESPIRATORY_TRACT | Status: DC | PRN

## 2016-10-01 MED ORDER — DOCUSATE SODIUM 100 MG PO CAPS
100.0000 mg | ORAL_CAPSULE | Freq: Two times a day (BID) | ORAL | Status: DC
  Administered 2016-10-01 – 2016-10-03 (×4): 100 mg via ORAL
  Filled 2016-10-01 (×4): qty 1

## 2016-10-01 MED ORDER — SODIUM CHLORIDE 0.9 % IV SOLN
INTRAVENOUS | Status: DC
  Administered 2016-10-01: 18:00:00 via INTRAVENOUS

## 2016-10-01 MED ORDER — PROPOFOL 10 MG/ML IV BOLUS
INTRAVENOUS | Status: DC | PRN
  Administered 2016-10-01: 50 mg via INTRAVENOUS

## 2016-10-01 MED ORDER — FENTANYL CITRATE (PF) 100 MCG/2ML IJ SOLN
50.0000 ug | INTRAMUSCULAR | Status: DC | PRN
  Administered 2016-10-01: 50 ug via INTRAVENOUS

## 2016-10-01 MED ORDER — ACETAMINOPHEN 325 MG PO TABS
650.0000 mg | ORAL_TABLET | Freq: Four times a day (QID) | ORAL | Status: DC | PRN
  Administered 2016-10-02: 650 mg via ORAL
  Filled 2016-10-01: qty 2

## 2016-10-01 MED ORDER — PHENYLEPHRINE HCL 10 MG/ML IJ SOLN
INTRAMUSCULAR | Status: DC | PRN
  Administered 2016-10-01: 120 ug via INTRAVENOUS
  Administered 2016-10-01: 200 ug via INTRAVENOUS
  Administered 2016-10-01: 80 ug via INTRAVENOUS
  Administered 2016-10-01: 200 ug via INTRAVENOUS
  Administered 2016-10-01: 80 ug via INTRAVENOUS
  Administered 2016-10-01: 120 ug via INTRAVENOUS

## 2016-10-01 MED ORDER — ROPIVACAINE HCL 7.5 MG/ML IJ SOLN
INTRAMUSCULAR | Status: DC | PRN
  Administered 2016-10-01: 20 mL

## 2016-10-01 MED ORDER — SODIUM CHLORIDE 0.9 % IR SOLN
Status: DC | PRN
  Administered 2016-10-01: 3000 mL

## 2016-10-01 MED ORDER — ONDANSETRON HCL 4 MG/2ML IJ SOLN
4.0000 mg | Freq: Four times a day (QID) | INTRAMUSCULAR | Status: DC | PRN
  Administered 2016-10-01: 4 mg via INTRAVENOUS
  Filled 2016-10-01: qty 2

## 2016-10-01 MED ORDER — ASPIRIN EC 325 MG PO TBEC
325.0000 mg | DELAYED_RELEASE_TABLET | Freq: Every day | ORAL | Status: DC
  Administered 2016-10-02 – 2016-10-03 (×2): 325 mg via ORAL
  Filled 2016-10-01 (×2): qty 1

## 2016-10-01 MED ORDER — PANTOPRAZOLE SODIUM 40 MG PO TBEC
40.0000 mg | DELAYED_RELEASE_TABLET | Freq: Every day | ORAL | Status: DC
  Administered 2016-10-02 – 2016-10-03 (×2): 40 mg via ORAL
  Filled 2016-10-01 (×2): qty 1

## 2016-10-01 MED ORDER — VANCOMYCIN HCL IN DEXTROSE 1-5 GM/200ML-% IV SOLN
1000.0000 mg | Freq: Two times a day (BID) | INTRAVENOUS | Status: AC
  Administered 2016-10-01: 1000 mg via INTRAVENOUS
  Filled 2016-10-01: qty 200

## 2016-10-01 SURGICAL SUPPLY — 69 items
BANDAGE ACE 4X5 VEL STRL LF (GAUZE/BANDAGES/DRESSINGS) ×3 IMPLANT
BANDAGE ELASTIC 4 VELCRO ST LF (GAUZE/BANDAGES/DRESSINGS) ×3 IMPLANT
BANDAGE ESMARK 6X9 LF (GAUZE/BANDAGES/DRESSINGS) ×1 IMPLANT
BENZOIN TINCTURE PRP APPL 2/3 (GAUZE/BANDAGES/DRESSINGS) ×3 IMPLANT
BLADE SAGITTAL 25.0X1.19X90 (BLADE) ×2 IMPLANT
BLADE SAGITTAL 25.0X1.19X90MM (BLADE) ×1
BLADE SAW SGTL 13X75X1.27 (BLADE) ×3 IMPLANT
BNDG ELASTIC 6X10 VLCR STRL LF (GAUZE/BANDAGES/DRESSINGS) ×3 IMPLANT
BNDG ESMARK 6X9 LF (GAUZE/BANDAGES/DRESSINGS) ×3
BOWL SMART MIX CTS (DISPOSABLE) ×3 IMPLANT
CAPT KNEE TOTAL 3 ATTUNE ×3 IMPLANT
CEMENT HV SMART SET (Cement) ×6 IMPLANT
CLOSURE STERI-STRIP 1/2X4 (GAUZE/BANDAGES/DRESSINGS) ×1
CLOSURE WOUND 1/2 X4 (GAUZE/BANDAGES/DRESSINGS) ×2
CLSR STERI-STRIP ANTIMIC 1/2X4 (GAUZE/BANDAGES/DRESSINGS) ×2 IMPLANT
COVER SURGICAL LIGHT HANDLE (MISCELLANEOUS) ×3 IMPLANT
CUFF TOURNIQUET SINGLE 34IN LL (TOURNIQUET CUFF) ×3 IMPLANT
CUFF TOURNIQUET SINGLE 44IN (TOURNIQUET CUFF) IMPLANT
DRAPE ORTHO SPLIT 77X108 STRL (DRAPES) ×6
DRAPE SURG ORHT 6 SPLT 77X108 (DRAPES) ×3 IMPLANT
DRAPE U-SHAPE 47X51 STRL (DRAPES) ×3 IMPLANT
DRSG PAD ABDOMINAL 8X10 ST (GAUZE/BANDAGES/DRESSINGS) ×3 IMPLANT
DURAPREP 26ML APPLICATOR (WOUND CARE) ×3 IMPLANT
ELECT REM PT RETURN 9FT ADLT (ELECTROSURGICAL) ×3
ELECTRODE REM PT RTRN 9FT ADLT (ELECTROSURGICAL) ×1 IMPLANT
EVACUATOR 1/8 PVC DRAIN (DRAIN) IMPLANT
FACESHIELD WRAPAROUND (MASK) ×6 IMPLANT
GAUZE SPONGE 4X4 12PLY STRL (GAUZE/BANDAGES/DRESSINGS) ×3 IMPLANT
GAUZE XEROFORM 5X9 LF (GAUZE/BANDAGES/DRESSINGS) ×3 IMPLANT
GLOVE BIOGEL PI IND STRL 8 (GLOVE) ×2 IMPLANT
GLOVE BIOGEL PI INDICATOR 8 (GLOVE) ×4
GLOVE ORTHO TXT STRL SZ7.5 (GLOVE) ×6 IMPLANT
GOWN STRL REUS W/ TWL LRG LVL3 (GOWN DISPOSABLE) ×1 IMPLANT
GOWN STRL REUS W/ TWL XL LVL3 (GOWN DISPOSABLE) ×1 IMPLANT
GOWN STRL REUS W/TWL 2XL LVL3 (GOWN DISPOSABLE) ×3 IMPLANT
GOWN STRL REUS W/TWL LRG LVL3 (GOWN DISPOSABLE) ×2
GOWN STRL REUS W/TWL XL LVL3 (GOWN DISPOSABLE) ×2
HANDPIECE INTERPULSE COAX TIP (DISPOSABLE) ×2
IMMOBILIZER KNEE 20 (SOFTGOODS) ×3
IMMOBILIZER KNEE 20 THIGH 36 (SOFTGOODS) ×1 IMPLANT
IMMOBILIZER KNEE 22 UNIV (SOFTGOODS) IMPLANT
KIT BASIN OR (CUSTOM PROCEDURE TRAY) ×3 IMPLANT
KIT ROOM TURNOVER OR (KITS) ×3 IMPLANT
MANIFOLD NEPTUNE II (INSTRUMENTS) ×3 IMPLANT
MARKER SKIN DUAL TIP RULER LAB (MISCELLANEOUS) ×3 IMPLANT
NEEDLE HYPO 25GX1X1/2 BEV (NEEDLE) ×3 IMPLANT
NS IRRIG 1000ML POUR BTL (IV SOLUTION) ×3 IMPLANT
PACK TOTAL JOINT (CUSTOM PROCEDURE TRAY) ×3 IMPLANT
PAD ARMBOARD 7.5X6 YLW CONV (MISCELLANEOUS) ×9 IMPLANT
PAD CAST 4YDX4 CTTN HI CHSV (CAST SUPPLIES) ×1 IMPLANT
PADDING CAST COTTON 4X4 STRL (CAST SUPPLIES) ×2
PADDING CAST COTTON 6X4 STRL (CAST SUPPLIES) ×3 IMPLANT
SET HNDPC FAN SPRY TIP SCT (DISPOSABLE) ×1 IMPLANT
STAPLER VISISTAT 35W (STAPLE) ×3 IMPLANT
STRIP CLOSURE SKIN 1/2X4 (GAUZE/BANDAGES/DRESSINGS) ×4 IMPLANT
SUCTION FRAZIER HANDLE 10FR (MISCELLANEOUS) ×2
SUCTION TUBE FRAZIER 10FR DISP (MISCELLANEOUS) ×1 IMPLANT
SUT VIC AB 0 CT1 27 (SUTURE) ×2
SUT VIC AB 0 CT1 27XBRD ANBCTR (SUTURE) ×1 IMPLANT
SUT VIC AB 1 CTX 36 (SUTURE) ×4
SUT VIC AB 1 CTX36XBRD ANBCTR (SUTURE) ×2 IMPLANT
SUT VIC AB 2-0 CT1 27 (SUTURE) ×4
SUT VIC AB 2-0 CT1 TAPERPNT 27 (SUTURE) ×2 IMPLANT
SUT VIC AB 3-0 X1 27 (SUTURE) ×3 IMPLANT
SYR CONTROL 10ML LL (SYRINGE) ×3 IMPLANT
TOWEL OR 17X24 6PK STRL BLUE (TOWEL DISPOSABLE) ×3 IMPLANT
TOWEL OR 17X26 10 PK STRL BLUE (TOWEL DISPOSABLE) ×3 IMPLANT
TRAY CATH 16FR W/PLASTIC CATH (SET/KITS/TRAYS/PACK) IMPLANT
YANKAUER SUCT BULB TIP NO VENT (SUCTIONS) ×3 IMPLANT

## 2016-10-01 NOTE — Anesthesia Preprocedure Evaluation (Addendum)
Anesthesia Evaluation  Patient identified by MRN, date of birth, ID band Patient awake    Reviewed: Allergy & Precautions, H&P , Patient's Chart, lab work & pertinent test results, reviewed documented beta blocker date and time   Airway Mallampati: II  TM Distance: >3 FB Neck ROM: full    Dental no notable dental hx.    Pulmonary    Pulmonary exam normal breath sounds clear to auscultation       Cardiovascular hypertension,  Rhythm:regular Rate:Normal     Neuro/Psych    GI/Hepatic   Endo/Other    Renal/GU      Musculoskeletal   Abdominal   Peds  Hematology   Anesthesia Other Findings Hypertension    Asthma    CPAP Lupus   Fibromyalgia    GERD     SVT (supraventricular tachycardia) ([place]) 12/03/2015 ablation for SVT Shortness of breath dyspnea    OSA  Moderate with AHI 16/hr on CPAP COPD - Lungs clear today; breathing well        Reproductive/Obstetrics                           Anesthesia Physical Anesthesia Plan  ASA: III  Anesthesia Plan: Spinal   Post-op Pain Management:    Induction:   Airway Management Planned:   Additional Equipment:   Intra-op Plan:   Post-operative Plan:   Informed Consent: I have reviewed the patients History and Physical, chart, labs and discussed the procedure including the risks, benefits and alternatives for the proposed anesthesia with the patient or authorized representative who has indicated his/her understanding and acceptance.   Dental Advisory Given  Plan Discussed with: CRNA  Anesthesia Plan Comments: (Lab work and procedure  confirmed with CRNA in room; platelets okay. Discussed spinal anesthetic, and patient consents to the procedure:  included risk of possible headache,backache, failed block, allergic reaction, and nerve injury. This patient was asked if she had any questions or concerns before the procedure started.  )         Anesthesia Quick Evaluation

## 2016-10-01 NOTE — Anesthesia Procedure Notes (Signed)
Procedure Name: MAC Date/Time: 10/01/2016 12:58 PM Performed by: Lance Coon Pre-anesthesia Checklist: Patient identified, Emergency Drugs available, Suction available, Patient being monitored and Timeout performed Patient Re-evaluated:Patient Re-evaluated prior to inductionOxygen Delivery Method: Simple face mask

## 2016-10-01 NOTE — Progress Notes (Signed)
Orthopedic Tech Progress Note Patient Details:  Jessica Watts 07-02-1946 [id number] Readjusted cpm  Patient ID: [name], female   DOB: May 31, 1946, 70 y.o.   MRN: [id number]   Braulio Bosch 10/01/2016, 6:43 PM

## 2016-10-01 NOTE — Interval H&P Note (Signed)
History and Physical Interval Note:  10/01/2016 12:43 PM  Jessica Watts  has presented today for surgery, with the diagnosis of LEFT KNEE OSTEOARTHRITIS   The various methods of treatment have been discussed with the patient and family. After consideration of risks, benefits and other options for treatment, the patient has consented to  Procedure(s): LEFT TOTAL KNEE ARTHROPLASTY (Left) as a surgical intervention .  The patient's history has been reviewed, patient examined, no change in status, stable for surgery.  I have reviewed the patient's chart and labs.  Questions were answered to the patient's satisfaction.     Marybelle Killings

## 2016-10-01 NOTE — Anesthesia Procedure Notes (Signed)
Spinal  Patient location during procedure: OR Staffing Anesthesiologist: Lyndle Herrlich Preanesthetic Checklist Completed: patient identified, site marked, surgical consent, pre-op evaluation, timeout performed, IV checked, risks and benefits discussed and monitors and equipment checked Spinal Block Patient position: sitting Prep: DuraPrep Patient monitoring: cardiac monitor, continuous pulse ox, blood pressure and heart rate Approach: midline Location: L3-4 Injection technique: catheter Needle Needle type: Tuohy and Sprotte  Needle gauge: 24 G Needle length: 12.7 cm Needle insertion depth: 7 cm Catheter type: closed end flexible Catheter size: 19 g Additional Notes sprotte thru tuohy Possible scoliosis with best flow after R sided angulation Spinal Dosage in OR  Bupivicaine ml       1.6 LLD x 3 min

## 2016-10-01 NOTE — Interval H&P Note (Signed)
History and Physical Interval Note:  10/01/2016 12:44 PM  Jessica Watts  has presented today for surgery, with the diagnosis of LEFT KNEE OSTEOARTHRITIS   The various methods of treatment have been discussed with the patient and family. After consideration of risks, benefits and other options for treatment, the patient has consented to  Procedure(s): LEFT TOTAL KNEE ARTHROPLASTY (Left) as a surgical intervention .  The patient's history has been reviewed, patient examined, no change in status, stable for surgery.  I have reviewed the patient's chart and labs.  Questions were answered to the patient's satisfaction.     Marybelle Killings

## 2016-10-01 NOTE — Transfer of Care (Signed)
Immediate Anesthesia Transfer of Care Note  Patient: Jessica Watts  Procedure(s) Performed: Procedure(s): LEFT TOTAL KNEE ARTHROPLASTY (Left)  Patient Location: PACU  Anesthesia Type:MAC, Spinal and MAC combined with regional for post-op pain  Level of Consciousness: awake, alert  and patient cooperative  Airway & Oxygen Therapy: Patient Spontanous Breathing  Post-op Assessment: Report given to RN and Post -op Vital signs reviewed and stable  Post vital signs: Reviewed and stable  Last Vitals:  Vitals:   10/01/16 1135 10/01/16 1140  BP: (!) 175/101 (!) 155/69  Pulse: 71 72  Resp: 17 16  Temp:      Last Pain:  Vitals:   10/01/16 1022  TempSrc:   PainSc: 5       Patients Stated Pain Goal: 8 ([id number])  Complications: No apparent anesthesia complications

## 2016-10-01 NOTE — Progress Notes (Signed)
Orthopedic Tech Progress Note Patient Details:  Jessica Watts [birth year] [id number]  CPM Left Knee CPM Left Knee: On Left Knee Flexion (Degrees): 90 Left Knee Extension (Degrees): 0  Ortho Devices Ortho Device/Splint Location: footsie roll Ortho Device/Splint Interventions: Ordered, Application, Adjustment   Braulio Bosch 10/01/2016, 3:42 PM

## 2016-10-01 NOTE — Anesthesia Procedure Notes (Signed)
Anesthesia Regional Block:  Adductor canal block  Pre-Anesthetic Checklist: ,, timeout performed, Correct Patient, Correct Site, Correct Laterality, Correct Procedure, Correct Position, site marked, Risks and benefits discussed, pre-op evaluation,  At surgeon's request and post-op pain management  Laterality: Left  Prep: chloraprep       Needles:      Needle Length: 9cm 9 cm Needle Gauge: 21 and 21 G    Additional Needles:  Procedures: ultrasound guided (picture in chart) Adductor canal block Narrative:  Start time: 10/01/2016 11:26 AM End time: 10/01/2016 11:35 AM Injection made incrementally with aspirations every 5 mL. Anesthesiologist: Lyndle Herrlich  Additional Notes: 20cc .75% Naropin

## 2016-10-01 NOTE — Brief Op Note (Signed)
10/01/2016  3:06 PM  PATIENT:  Jessica Watts  70 y.o. female  PRE-OPERATIVE DIAGNOSIS:  LEFT KNEE OSTEOARTHRITIS   POST-OPERATIVE DIAGNOSIS:  LEFT KNEE OSTEOARTHRITIS   PROCEDURE:  Procedure(s): LEFT TOTAL KNEE ARTHROPLASTY (Left)  SURGEON:  Surgeon(s) and Role:    * Marybelle Killings, MD - Primary  PHYSICIAN ASSISTANT: Benjiman Core pa-c     ANESTHESIA:   spinal  EBL:  Total I/O In: 1700 [I.V.:1700] Out: -   BLOOD ADMINISTERED:none  DRAINS: none   LOCAL MEDICATIONS USED:  MARCAINE/exparel     SPECIMEN:  No Specimen  DISPOSITION OF SPECIMEN:  N/A  COUNTS:  YES  TOURNIQUET:   Total Tourniquet Time Documented: Thigh (Left) - 60 minutes Total: Thigh (Left) - 60 minutes   DICTATION: .Viviann Spare Dictation  PLAN OF CARE: Admit to inpatient   PATIENT DISPOSITION:  PACU - hemodynamically stable.

## 2016-10-01 NOTE — Op Note (Signed)
Preop diagnosis: Left primary knee osteoarthritis  Postop diagnosis: Same  Procedure: Left cemented total knee arthroplasty  Surgeon: Rodell Perna M.D.  Assistant: Benjiman Core PA-C, medically necessary and present for the entire procedure  Anesthesia spinal plus Expiril and Marcaine  Tourniquet time: 350 times less than 1 hour  Implants: Depuy ATTUNE #5 femur #5 tibia 6 mm lipped spacer 38 mm patella rotating platform.  Procedure: After induction of spinal anesthesia proximal thigh tourniquet lateral post heel bump Center prepping and draping DuraPrep including the toes usual total knee drapes sterile skin marker Betadine Steri-Drape clindamycin preop due to allergy to amoxicillin and timeout procedure was completed  Leg was wrapped with an Esmarch tourniquet. Tourniquet was inflated to 350. Midline incision was made medial parapatellar incision was made patella was cut resecting 10 mm. Sizing for intramedullary alignment was performed #5 femur chamfer cuts were made. 9 mm resection on the tibia anterior cruciate ligament PCL meniscal remnants were resected patient had primary osteoarthritis grade 4 changes bone-on-bone primarily the worst area was the medial compartment. He'll preparation on the tibia trial sizing 6 mm gave full extension and excellent balance. Cement was vacuum mixed after lug holes were drilled and patella 3 peg patella was drilled. Cement was mixed and placed in the tibia first followed by femur placement of the poly-spacer permanent and then patello-held with the patellar clamp all excessive cement was removed. Cement was hard at 15 minutes tourniquet was deflated less than 1 hour hemostasis obtained and then standard layered closure. Postop dressing applied and patient was transferred recovery room.

## 2016-10-02 ENCOUNTER — Encounter (HOSPITAL_COMMUNITY): Payer: Self-pay | Admitting: Orthopaedic Surgery

## 2016-10-02 LAB — BASIC METABOLIC PANEL
Anion gap: 4 — ABNORMAL LOW (ref 5–15)
BUN: 6 mg/dL (ref 6–20)
CALCIUM: 8.1 mg/dL — AB (ref 8.9–10.3)
CO2: 31 mmol/L (ref 22–32)
CREATININE: 0.74 mg/dL (ref 0.44–1.00)
Chloride: 101 mmol/L (ref 101–111)
GFR calc Af Amer: >60 mL/min (ref >60)
GFR calc non Af Amer: >60 mL/min (ref >60)
Glucose, Bld: 123 mg/dL — ABNORMAL HIGH (ref 65–99)
Potassium: 3.4 mmol/L — ABNORMAL LOW (ref 3.5–5.1)
SODIUM: 136 mmol/L (ref 135–145)

## 2016-10-02 LAB — CBC
HCT: 34.5 % — ABNORMAL LOW (ref 36.0–46.0)
Hemoglobin: 11.7 g/dL — ABNORMAL LOW (ref 12.0–15.0)
MCH: 31.1 pg (ref 26.0–34.0)
MCHC: 33.9 g/dL (ref 30.0–36.0)
MCV: 91.8 fL (ref 78.0–100.0)
Platelets: 151 10*3/uL (ref 150–400)
RBC: 3.76 MIL/uL — ABNORMAL LOW (ref 3.87–5.11)
RDW: 12.7 % (ref 11.5–15.5)
WBC: 8.7 10*3/uL (ref 4.0–10.5)

## 2016-10-02 MED ORDER — OXYCODONE HCL ER 10 MG PO T12A
10.0000 mg | EXTENDED_RELEASE_TABLET | Freq: Two times a day (BID) | ORAL | Status: DC
  Administered 2016-10-02 – 2016-10-03 (×4): 10 mg via ORAL
  Filled 2016-10-02 (×4): qty 1

## 2016-10-02 NOTE — Anesthesia Postprocedure Evaluation (Signed)
Anesthesia Post Note  Patient: Jessica Watts  Procedure(s) Performed: Procedure(s) (LRB): LEFT TOTAL KNEE ARTHROPLASTY (Left)  Patient location during evaluation: PACU Anesthesia Type: Spinal and Regional Level of consciousness: awake and alert, oriented and patient cooperative Pain management: pain level controlled Vital Signs Assessment: post-procedure vital signs reviewed and stable Respiratory status: spontaneous breathing, nonlabored ventilation and respiratory function stable Cardiovascular status: blood pressure returned to baseline and stable Postop Assessment: spinal receding and patient able to bend at knees Anesthetic complications: no Comments: *delayed entry: pt eval in PACU 10/01/16       Last Vitals:  Vitals:   10/02/16 0012 10/02/16 0500  BP: (!) 122/56 (!) 140/45  Pulse: 77 77  Resp: 16 16  Temp: 36.8 C 37.3 C    Last Pain:  Vitals:   10/02/16 0553  TempSrc:   PainSc: Asleep                 Dorris Vangorder,E. Laylia Mui

## 2016-10-02 NOTE — Progress Notes (Signed)
PT Cancellation Note  Patient Details Name: MAGDLINE MAKI MRN: [id number] DOB: [birth year]   Cancelled Treatment:     Pt declined treatment this afternoon as she had just finished working with OT and is in high pain. RN notified.    Tonia Brooms 10/02/2016, 4:29 PM  Tonia Brooms, SPT [phone]  During this treatment session, the therapist was present, participating in and directing the treatment. Cassell Clement, PT, [place] Pager [phone] Office [phone]

## 2016-10-02 NOTE — Progress Notes (Signed)
Physical Therapy Evaluation Patient Details Name: Jessica Watts MRN: [id number] DOB: 1946/01/21 Today's Date: 10/02/2016   History of Present Illness  Pt presents with L TKA on 10/01/2016. PMH of lumbar fusion, cervical fusion, shoulder surgery, neuroma surgery (L foot), COPD, fibromyalgia, migraines, lupus, macular degeneration (L eye), and SOB   Clinical Impression  PTA, pt independent with all ADLs with occasional dependence on SPC. Pt will be temporarily living with sister and her husband as it provides a more accessible environment during her recovery. Pt nauseous upon sitting at EOB but agreeable to continue session. Pt able to walk to door (15 ft) with standing rest break x1 but required min guard throughout as she demonstrated signs of fatigue and nausea. Pt is a good candidate for HHPT to address deficits in strength and mobility to allow for return to baseline function. PT will continue to follow acutely to allow d/c to next venue.     Follow Up Recommendations Home health PT    Equipment Recommendations  Rolling walker with 5" wheels;3in1 (PT)    Recommendations for Other Services       Precautions / Restrictions Precautions Precautions: Knee;Fall Precaution Booklet Issued: Yes (comment) Precaution Comments: Reviewed knee precautions Restrictions Weight Bearing Restrictions: Yes LLE Weight Bearing: Weight bearing as tolerated      Mobility  Bed Mobility Overal bed mobility: Needs Assistance Bed Mobility: Supine to Sit     Supine to sit: Min assist     General bed mobility comments: Min assist to lift LLE OOB; pt nauseous upon sitting at EOB  Transfers Overall transfer level: Needs assistance Equipment used: Rolling walker (2 wheeled) Transfers: Sit to/from Stand Sit to Stand: Min assist         General transfer comment: Min assist to power to stand. VCs for hand placement  Ambulation/Gait Ambulation/Gait assistance: Min guard Ambulation Distance  (Feet): 15 Feet Assistive device: Rolling walker (2 wheeled) Gait Pattern/deviations: Step-to pattern;Decreased stride length;Decreased weight shift to left;Antalgic Gait velocity: decreased Gait velocity interpretation: Below normal speed for age/gender General Gait Details: Pt with antalgic gait 2/2 pain in LLE. Required standing rest break x1. VCs for sequencing of steps  Stairs            Wheelchair Mobility    Modified Rankin (Stroke Patients Only)       Balance Overall balance assessment: Needs assistance Sitting-balance support: Single extremity supported;Feet supported Sitting balance-Leahy Scale: Fair     Standing balance support: Bilateral upper extremity supported;During functional activity Standing balance-Leahy Scale: Poor Standing balance comment: Pt requires B UE on RW for support                             Pertinent Vitals/Pain Pain Assessment: 0-10 Pain Score: 5  Pain Location: knee Pain Descriptors / Indicators: Aching Pain Intervention(s): Limited activity within patient's tolerance;Monitored during session;Repositioned;Ice applied;Relaxation    Home Living Family/patient expects to be discharged to:: Private residence Living Arrangements:  (Temporarily living with sister and her husband) Available Help at Discharge: Family;Available 24 hours/day Type of Home: Apartment Home Access: Level entry     Home Layout: One level Home Equipment: Cane - quad;Other (comment) (rollator)      Prior Function Level of Independence: Independent with assistive device(s) (sometimes used cane)               Hand Dominance   Dominant Hand: Right    Extremity/Trunk Assessment   Upper Extremity  Assessment Upper Extremity Assessment: Defer to OT evaluation    Lower Extremity Assessment Lower Extremity Assessment: LLE deficits/detail LLE Deficits / Details: Limitations in strength and ROM as expected post-operatively        Communication   Communication: No difficulties  Cognition Arousal/Alertness: Awake/alert Behavior During Therapy: WFL for tasks assessed/performed Overall Cognitive Status: Within Functional Limits for tasks assessed                      General Comments      Exercises Total Joint Exercises Ankle Circles/Pumps: AROM;Left;10 reps;Seated Heel Slides: AROM;5 reps;Left;Seated Goniometric ROM: 10 to 53   Assessment/Plan    PT Assessment Patient needs continued PT services  PT Problem List Decreased strength;Decreased range of motion;Decreased activity tolerance;Decreased balance;Decreased mobility;Decreased knowledge of use of DME;Decreased safety awareness;Decreased knowledge of precautions;Pain          PT Treatment Interventions DME instruction;Gait training;Functional mobility training;Therapeutic activities;Therapeutic exercise;Balance training;Patient/family education    PT Goals (Current goals can be found in the Care Plan section)  Acute Rehab PT Goals Patient Stated Goal: to be able to walk and be independent PT Goal Formulation: With patient Time For Goal Achievement: 10/16/16 Potential to Achieve Goals: Fair    Frequency 7X/week   Barriers to discharge        Co-evaluation               End of Session Equipment Utilized During Treatment: Gait belt Activity Tolerance: Patient limited by pain;Patient limited by fatigue Patient left: in chair;with call bell/phone within reach Nurse Communication:  (Nurse notified that line is bleeding (R wrist))         Time: [id number] PT Time Calculation (min) (ACUTE ONLY): 23 min   Charges:   PT Evaluation $PT Eval Moderate Complexity: 1 Procedure PT Treatments $Gait Training: 8-22 mins   PT G Codes:        Tonia Brooms 10/17/2016, 12:59 PM Tonia Brooms, SPT [phone]  Evaluation/Treatment and plan of care reviewed and agree upon between student and supervising PT.  Cassell Clement, PT,  [place] Pager [phone] Office [phone]

## 2016-10-02 NOTE — Progress Notes (Signed)
   Subjective: 1 Day Post-Op Procedure(s) (LRB): LEFT TOTAL KNEE ARTHROPLASTY (Left) Patient reports pain as severe.    Objective: Vital signs in last 24 hours: Temp:  [97.6 F (36.4 C)-99.1 F (37.3 C)] 99.1 F (37.3 C) (12/21 0500) Pulse Rate:  [54-80] 77 (12/21 0500) Resp:  [9-20] 16 (12/21 0500) BP: (65-190)/(45-111) 140/45 (12/21 0500) SpO2:  [91 %-100 %] 97 % (12/21 0500) Weight:  [211 lb (95.7 kg)] 211 lb (95.7 kg) (12/20 1022)  Intake/Output from previous day: 12/20 0701 - 12/21 0700 In: 2824 [I.V.:2624; IV Piggyback:200] Out: 650 [Urine:550; Blood:100] Intake/Output this shift: No intake/output data recorded.   Recent Labs  10/02/16 0641  HGB 11.7*    Recent Labs  10/02/16 0641  WBC 8.7  RBC 3.76*  HCT 34.5*  PLT 151   No results for input(s): NA, K, CL, CO2, BUN, CREATININE, GLUCOSE, CALCIUM in the last 72 hours. No results for input(s): LABPT, INR in the last 72 hours.  Neurologically intact Dg Knee 1-2 Views Left  Result Date: 10/01/2016 CLINICAL DATA:  Status post left knee replacement EXAM: LEFT KNEE - 1-2 VIEW COMPARISON:  None. FINDINGS: Left knee replacement is noted. No acute bony or soft tissue abnormality is seen. IMPRESSION: Status post left knee replacement. Electronically Signed   By: Inez Catalina M.D.   On: 10/01/2016 15:36    Assessment/Plan: 1 Day Post-Op Procedure(s) (LRB): LEFT TOTAL KNEE ARTHROPLASTY (Left) Up with therapy dressing changed this AM  Marybelle Killings 10/02/2016, 7:44 AM

## 2016-10-02 NOTE — Evaluation (Signed)
Occupational Therapy Evaluation Patient Details Name: Jessica Watts MRN: [id number] DOB: 03-17-1946 Today's Date: 10/02/2016    History of Present Illness Pt presents with L TKA on 10/01/2016. PMH of lumbar fusion, cervical fusion, shoulder surgery, neuroma surgery (L foot), COPD, fibromyalgia, migraines, lupus, macular degeneration (L eye), and SOB    Clinical Impression   PTA, pt was independent with assistive devices for ADL and functional mobility and was using a single point cane at times. Pt plans to D/C to sister's home post-acute D/C and will have 24 hour assistance from her sister and brother in law. Pt currently requires max assist with LB ADL and min assist with UB ADL. Pt limited by pain this session and only able to tolerate briefly sitting at EOB. Post-acute D/C no OT follow-up recommended. OT will continue to follow acutely with focus on shower transfers and LB ADL. Recommend 3-in-1 BSC for DME needs.    Follow Up Recommendations  No OT follow up;Supervision/Assistance - 24 hour    Equipment Recommendations  3 in 1 bedside commode       Precautions / Restrictions Precautions Precautions: Knee;Fall Precaution Booklet Issued: No Precaution Comments: Reviewed knee precautions Restrictions Weight Bearing Restrictions: Yes LLE Weight Bearing: Weight bearing as tolerated      Mobility Bed Mobility Overal bed mobility: Needs Assistance Bed Mobility: Supine to Sit;Sit to Supine     Supine to sit: Mod assist Sit to supine: Mod assist      Transfers                 General transfer comment: Pt with intense pain upon sitting at EOB and unable to tolerate further moblity.    Balance Overall balance assessment: Needs assistance Sitting-balance support: Single extremity supported;Feet supported Sitting balance-Leahy Scale: Fair                                      ADL Overall ADL's : Needs assistance/impaired     Grooming: Set  up;Sitting   Upper Body Bathing: Minimal assistance;Sitting   Lower Body Bathing: Maximal assistance;Sit to/from stand   Upper Body Dressing : Minimal assistance;Sitting   Lower Body Dressing: Maximal assistance;Sit to/from stand                 General ADL Comments: Upon sitting at EOB, pt with intense pain and unable to tolerate sitting EOB. Educated on knee precautions during ADL.     Vision Vision Assessment?: No apparent visual deficits   Perception     Praxis      Pertinent Vitals/Pain Pain Assessment: 0-10 Pain Score: 5  (Although pt nearly in tears) Pain Location: knee Pain Descriptors / Indicators: Aching Pain Intervention(s): Limited activity within patient's tolerance;Monitored during session;Repositioned;Ice applied     Hand Dominance Right   Extremity/Trunk Assessment Upper Extremity Assessment Upper Extremity Assessment: Overall WFL for tasks assessed   Lower Extremity Assessment Lower Extremity Assessment: Defer to PT evaluation       Communication Communication Communication: No difficulties   Cognition Arousal/Alertness: Awake/alert Behavior During Therapy: WFL for tasks assessed/performed Overall Cognitive Status: Within Functional Limits for tasks assessed                     General Comments       Exercises       Shoulder Instructions      Home Living Family/patient expects to be  discharged to:: Private residence Living Arrangements: Alone (Temporarily living with sister and her husband) Available Help at Discharge: Family;Available 24 hours/day Type of Home: Apartment Home Access: Level entry     Home Layout: One level     Bathroom Shower/Tub: Occupational psychologist: Handicapped height     Home Equipment: [place] held shower head;Other (comment) (rollator)          Prior Functioning/Environment Level of Independence: Independent with assistive device(s) (Occasionally using cane)                  OT Problem List: Decreased strength;Decreased range of motion;Decreased activity tolerance;Impaired balance (sitting and/or standing);Decreased safety awareness;Decreased knowledge of use of DME or AE;Decreased knowledge of precautions;Pain   OT Treatment/Interventions: Self-care/ADL training;Therapeutic exercise;Energy conservation;Therapeutic activities;Patient/family education;Balance training    OT Goals(Current goals can be found in the care plan section) Acute Rehab OT Goals Patient Stated Goal: to be able to walk and be independent OT Goal Formulation: With patient Time For Goal Achievement: 10/09/16 Potential to Achieve Goals: Good ADL Goals Pt Will Perform Lower Body Bathing: with supervision;sit to/from stand Pt Will Perform Lower Body Dressing: with supervision;sit to/from stand Pt Will Transfer to Toilet: with supervision;ambulating;bedside commode Pt Will Perform Toileting - Clothing Manipulation and hygiene: with supervision;sit to/from stand Pt Will Perform Tub/Shower Transfer: Shower transfer;with supervision;shower seat;rolling walker;ambulating;3 in 1  OT Frequency: Min 2X/week   Barriers to D/C:            Co-evaluation              End of Session Equipment Utilized During Treatment: Left knee immobilizer  Activity Tolerance: Patient limited by pain Patient left: in bed;with call bell/phone within reach;with nursing/sitter in room   Time: 1455-1515 OT Time Calculation (min): 20 min Charges:  OT General Charges $OT Visit: 1 Procedure OT Evaluation $OT Eval Moderate Complexity: 1 Procedure  Norman Herrlich, OTR/L [id number] 10/02/2016, 3:37 PM

## 2016-10-03 LAB — CBC
HCT: 32.9 % — ABNORMAL LOW (ref 36.0–46.0)
HEMOGLOBIN: 10.9 g/dL — AB (ref 12.0–15.0)
MCH: 31.1 pg (ref 26.0–34.0)
MCHC: 33.1 g/dL (ref 30.0–36.0)
MCV: 93.7 fL (ref 78.0–100.0)
PLATELETS: 137 10*3/uL — AB (ref 150–400)
RBC: 3.51 MIL/uL — AB (ref 3.87–5.11)
RDW: 12.9 % (ref 11.5–15.5)
WBC: 10.8 10*3/uL — AB (ref 4.0–10.5)

## 2016-10-03 LAB — BASIC METABOLIC PANEL
Anion gap: 10 (ref 5–15)
BUN: 7 mg/dL (ref 6–20)
CHLORIDE: 100 mmol/L — AB (ref 101–111)
CO2: 26 mmol/L (ref 22–32)
CREATININE: 0.86 mg/dL (ref 0.44–1.00)
Calcium: 8.6 mg/dL — ABNORMAL LOW (ref 8.9–10.3)
Glucose, Bld: 125 mg/dL — ABNORMAL HIGH (ref 65–99)
POTASSIUM: 3.3 mmol/L — AB (ref 3.5–5.1)
SODIUM: 136 mmol/L (ref 135–145)

## 2016-10-03 MED ORDER — MAGNESIUM CITRATE PO SOLN
0.5000 | Freq: Once | ORAL | Status: AC
  Administered 2016-10-03: 0.5 via ORAL
  Filled 2016-10-03: qty 296

## 2016-10-03 MED ORDER — METHOCARBAMOL 500 MG PO TABS: 500.0000 mg | ORAL_TABLET | Freq: Four times a day (QID) | ORAL | 0 refills | Status: DC | PRN

## 2016-10-03 MED ORDER — OXYCODONE-ACETAMINOPHEN 5-325 MG PO TABS
1.0000 | ORAL_TABLET | Freq: Four times a day (QID) | ORAL | 0 refills | Status: DC | PRN
Start: 2016-10-03 — End: 2017-01-07

## 2016-10-03 MED ORDER — ASPIRIN EC 325 MG PO TBEC: 325.0000 mg | DELAYED_RELEASE_TABLET | Freq: Every day | ORAL | 0 refills | Status: DC

## 2016-10-03 NOTE — Progress Notes (Signed)
Physical Therapy Treatment Patient Details Name: Jessica Watts MRN: [id number] DOB: 09-Feb-1946 Today's Date: 10/03/2016    History of Present Illness Pt presents with L TKA on 10/01/2016. PMH of lumbar fusion, cervical fusion, shoulder surgery, neuroma surgery (L foot), COPD, fibromyalgia, migraines, lupus, macular degeneration (L eye), and SOB     PT Comments    Pt with decreased pain this afternoon and able to demonstrate bed mobility and transfer sit<> stand with min guard. Pt required standing rest break x1 during ambulation (2x50) but demonstrated good safety awareness as she ambulated back to room. Pt continues to be a good candidate for HHPT to address deficits in mobility and activity tolerance before return to home. PT will continue to follow acutely.    Follow Up Recommendations  Home health PT     Equipment Recommendations  Rolling walker with 5" wheels;3in1 (PT)    Recommendations for Other Services       Precautions / Restrictions Precautions Precautions: Knee;Fall Required Braces or Orthoses: Knee Immobilizer - Left Restrictions Weight Bearing Restrictions: Yes LLE Weight Bearing: Weight bearing as tolerated    Mobility  Bed Mobility Overal bed mobility: Needs Assistance Bed Mobility: Supine to Sit     Supine to sit: Min guard Sit to supine: Mod assist   General bed mobility comments: Pt required mod assist for LE mobility into bed. VC for sequencing  Transfers Overall transfer level: Needs assistance Equipment used: Rolling walker (2 wheeled) Transfers: Sit to/from Stand Sit to Stand: Min guard         General transfer comment: Pt able to perform sit<>stand x2-x1 from bed, x1 from toilet with min guard  Ambulation/Gait Ambulation/Gait assistance: Min guard Ambulation Distance (Feet): 100 Feet (2x50; standing rest break x1) Assistive device: Rolling walker (2 wheeled) Gait Pattern/deviations: Step-to pattern;Decreased weight shift to  left;Antalgic Gait velocity: decreased Gait velocity interpretation: Below normal speed for age/gender General Gait Details: Pt with decreased antalgic gait since AM sesssion; however fatigued after 50 ft. Required standing rest break x1.   Stairs            Wheelchair Mobility    Modified Rankin (Stroke Patients Only)       Balance Overall balance assessment: Needs assistance Sitting-balance support: Feet supported Sitting balance-Leahy Scale: Fair     Standing balance support: Single extremity supported;During functional activity Standing balance-Leahy Scale: Fair Standing balance comment: Able to toilet herself with min guard. Able to stand while wiping without physical assist                    Cognition Arousal/Alertness: Awake/alert Behavior During Therapy: WFL for tasks assessed/performed Overall Cognitive Status: Within Functional Limits for tasks assessed                      Exercises Total Joint Exercises Hip ABduction/ADduction: AROM;Left;10 reps;Standing Straight Leg Raises: AAROM;Left;10 reps;Supine (2x5) Standing Hip Extension: AROM;Left;10 reps;Standing    General Comments        Pertinent Vitals/Pain Pain Assessment: 0-10 Pain Score: 3  Pain Location: knee Pain Descriptors / Indicators: Aching Pain Intervention(s): Limited activity within patient's tolerance;Monitored during session    Home Living                      Prior Function            PT Goals (current goals can now be found in the care plan section) Acute Rehab PT Goals Patient Stated  Goal: to be able to walk and be independent PT Goal Formulation: With patient Time For Goal Achievement: 10/16/16 Potential to Achieve Goals: Fair Progress towards PT goals: Progressing toward goals    Frequency    7X/week      PT Plan Current plan remains appropriate    Co-evaluation             End of Session Equipment Utilized During Treatment:  Gait belt Activity Tolerance: Patient limited by pain Patient left: with call bell/phone within reach;in bed     Time: 1355-1420 PT Time Calculation (min) (ACUTE ONLY): 25 min  Charges:  $Gait Training: 8-22 mins $Therapeutic Exercise: 8-22 mins                    G Codes:      Tonia Brooms 10-20-2016, 3:49 PM Tonia Brooms, SPT [phone]

## 2016-10-03 NOTE — Care Management Note (Signed)
Case Management Note  Patient Details  Name: Jessica Watts MRN: [id number] Date of Birth: 11-May-1946  Subjective/Objective:  70 yr old female s/p left total knee arthroplasty.                   Action/Plan: Case manager spoke with patient at the bedside concerning [place] and DME needs. Choice for Home Health agency was offered. Referral was called to Stevie Kern, [facility] Liaison. CM has ordered Rolling walker and 3in1 for patient. She states she will be going to her sister's home to recover. [address] [place], [place], [place]. Patient's cell# is [phone].   Expected Discharge Date:   10/03/16               Expected Discharge Plan:  [place]  In-House Referral:  NA  Discharge planning Services  CM Consult  Post Acute Care Choice:  Home Health, Durable Medical Equipment Choice offered to:  Patient  DME Arranged:  3-N-1, Walker rolling DME Agency:  [place]:  PT [place]:  [facility]  Status of Service:  Completed, signed off  If discussed at [place] of Stay Meetings, dates discussed:    Additional Comments:  Ninfa Meeker, RN 10/03/2016, 8:10 AM

## 2016-10-03 NOTE — Progress Notes (Signed)
Orthopedic Tech Progress Note Patient Details:  Jessica Watts Feb 23, 1946 [id number]  Patient ID: Jessica Watts, female   DOB: [birth year], 70 y.o.   MRN: [id number] Applied cpm 0-60  Jessica Watts 10/03/2016, 6:16 AM

## 2016-10-03 NOTE — Progress Notes (Signed)
Subjective: Patient moving slow with therapy.  Has only ambulated in room.  States that she is wanting to discharge home. No bowel movement 3 days. No complaints of nausea vomiting.  Objective: Vital signs in last 24 hours: Temp:  [98.6 F (37 C)-100.4 F (38 C)] 98.6 F (37 C) (12/22 0542) Pulse Rate:  [103-104] 103 (12/22 0542) Resp:  [16-17] 16 (12/22 0542) BP: (129-150)/(50-83) 129/50 (12/22 0542) SpO2:  [94 %-98 %] 97 % (12/22 0542)  Intake/Output from previous day: 12/21 0701 - 12/22 0700 In: 2665.5 [P.O.:480; I.V.:2185.5] Out: -  Intake/Output this shift: Total I/O In: 100 [P.O.:100] Out: -    Recent Labs  10/02/16 0641 10/03/16 0549  HGB 11.7* 10.9*    Recent Labs  10/02/16 0641 10/03/16 0549  WBC 8.7 10.8*  RBC 3.76* 3.51*  HCT 34.5* 32.9*  PLT 151 137*    Recent Labs  10/02/16 0641 10/03/16 0549  NA 136 136  K 3.4* 3.3*  CL 101 100*  CO2 31 26  BUN 6 7  CREATININE 0.74 0.86  GLUCOSE 123* 125*  CALCIUM 8.1* 8.6*   No results for input(s): LABPT, INR in the last 72 hours.  Exam; Pleasant female alert and oriented and in no acute distress. Knee wound looks good and staples are intact. No drainage or signs of infection. Bilateral calves are nontender and She Is Neurovascularly Intact. Skin Warm and Dry.   Assessment/Plan: Patient moving slow with PT. Advised her that she must be more mobile before discharge home. Briefly discussed discharge to skilled facility for rehabilitation but again voices that she would like to go home. Therapy will continue to work with her. She understands requirements for safe discharge home. Mag citrate one half bottle today.  But scripts on chart for Percocet, Robaxin and aspirin for DVT prophylaxis.   Benjiman Core 10/03/2016, 8:42 AM

## 2016-10-03 NOTE — Progress Notes (Signed)
Physical Therapy Treatment Patient Details Name: Jessica Watts MRN: [id number] DOB: 02-Nov-1945 Today's Date: 10/03/2016    History of Present Illness Pt presents with L TKA on 10/01/2016. PMH of lumbar fusion, cervical fusion, shoulder surgery, neuroma surgery (L foot), COPD, fibromyalgia, migraines, lupus, macular degeneration (L eye), and SOB     PT Comments    Pt working with OT on PT arrival. Pt able to ambulate 125 ft with supervision today. Pt demonstrates increasingly antalgic gait with further ambulation, likely 2/2 pain. Pt able to toilet herself independently and transfer x3 with min guard (x1 bed, x1chair, x1 toilet). Pt continues to be a good candidate for HHPT 2/2 good family support at home as well as accessible home environment. PT will continue to follow acutely to further assess concerns for mobility and decreased strength.      Follow Up Recommendations  Home health PT     Equipment Recommendations  Rolling walker with 5" wheels;3in1 (PT) (Knee immobilizer L)    Recommendations for Other Services       Precautions / Restrictions Precautions Precautions: Knee;Fall Precaution Booklet Issued: No Precaution Comments: Reviewed knee precautions Required Braces or Orthoses: Knee Immobilizer - Left Restrictions Weight Bearing Restrictions: Yes LLE Weight Bearing: Weight bearing as tolerated    Mobility  Bed Mobility Overal bed mobility: Needs Assistance Bed Mobility: Supine to Sit     Supine to sit: Min guard     General bed mobility comments: Pt with OT on PT arrival  Transfers Overall transfer level: Needs assistance Equipment used: Rolling walker (2 wheeled) Transfers: Sit to/from Stand Sit to Stand: Min guard         General transfer comment: Pt able to perform sit<>stand x3- x1 from toilet, x1 from chair, x1 from bed with min guard. Occasional VC for hand placement  Ambulation/Gait Ambulation/Gait assistance: Min guard Ambulation Distance  (Feet): 125 Feet Assistive device: Rolling walker (2 wheeled) Gait Pattern/deviations: Step-to pattern;Decreased weight shift to left;Antalgic;Decreased stance time - right Gait velocity: decreased Gait velocity interpretation: Below normal speed for age/gender General Gait Details: Pt demonstrates antalgic gait- moderate WB through UE to offset weight through LLE.    Stairs            Wheelchair Mobility    Modified Rankin (Stroke Patients Only)       Balance Overall balance assessment: Needs assistance Sitting-balance support: Feet supported;Single extremity supported;No upper extremity supported Sitting balance-Leahy Scale: Fair Sitting balance - Comments: Able to sit at EOB without UE support but prefers single UE support. Cannot tolerate challenges to balance.   Standing balance support: During functional activity;Bilateral upper extremity supported;No upper extremity supported Standing balance-Leahy Scale: Fair Standing balance comment: Able to stand statically without UE support but requires B UE support for dynamic activities.                    Cognition Arousal/Alertness: Awake/alert Behavior During Therapy: WFL for tasks assessed/performed Overall Cognitive Status: Within Functional Limits for tasks assessed                      Exercises Total Joint Exercises Hip ABduction/ADduction: AROM;Left;10 reps;Seated Long Arc Quad: AAROM;Left;10 reps;Seated Knee Flexion: AROM;Left;10 reps;Seated Goniometric ROM: 10 to 53    General Comments        Pertinent Vitals/Pain Pain Assessment: 0-10 Pain Score: 5  Pain Location: knee Pain Descriptors / Indicators: Aching Pain Intervention(s): Limited activity within patient's tolerance;Monitored during session;Repositioned  Home Living                      Prior Function            PT Goals (current goals can now be found in the care plan section) Acute Rehab PT Goals Patient Stated  Goal: to be able to walk and be independent PT Goal Formulation: With patient Time For Goal Achievement: 10/16/16 Potential to Achieve Goals: Fair Progress towards PT goals: Progressing toward goals    Frequency    7X/week      PT Plan Current plan remains appropriate    Co-evaluation             End of Session Equipment Utilized During Treatment: Gait belt Activity Tolerance: Patient limited by pain Patient left: in chair;with call bell/phone within reach;with nursing/sitter in room     Time: 0855-0921 PT Time Calculation (min) (ACUTE ONLY): 26 min  Charges:  $Gait Training: 8-22 mins $Therapeutic Exercise: 8-22 mins                    G Codes:      Tonia Brooms 2016-10-08, 10:34 AM Tonia Brooms, SPT [phone]  I have read, reviewed and agree with student's note.   [place] [phone] (pager)

## 2016-10-03 NOTE — Discharge Instructions (Signed)

## 2016-10-03 NOTE — Progress Notes (Signed)
Occupational Therapy Treatment Patient Details Name: Jessica Watts MRN: [id number] DOB: 03-07-1946 Today's Date: 10/03/2016    History of present illness Pt presents with L TKA on 10/01/2016. PMH of lumbar fusion, cervical fusion, shoulder surgery, neuroma surgery (L foot), COPD, fibromyalgia, migraines, lupus, macular degeneration (L eye), and SOB    OT comments  Pt progressing toward OT goals. Much improved from yesterday. Pt able to complete functional toilet transfers progressing from min guard assist to supervision level. Able to complete shower stall transfer with min assist and VC's for technique. Pt continues to report pain with mobility but able to tolerate standing grooming tasks for approximately 5 minutes this session. Continues to require increased assist with LB dressing. Pt reports that she will have 24 hour assistance at home. Updated D/C recommendations to home health OT services to improve independence with ADL and functional mobility post-acute D/C. OT will continue to follow acutely.   Follow Up Recommendations  Home health OT;Supervision/Assistance - 24 hour    Equipment Recommendations  3 in 1 bedside commode    Recommendations for Other Services      Precautions / Restrictions Precautions Precautions: Knee;Fall Precaution Booklet Issued: No Precaution Comments: Reviewed knee precautions Restrictions Weight Bearing Restrictions: Yes LLE Weight Bearing: Weight bearing as tolerated       Mobility Bed Mobility Overal bed mobility: Needs Assistance Bed Mobility: Supine to Sit     Supine to sit: Min guard     General bed mobility comments: Pt with OT on PT arrival  Transfers Overall transfer level: Needs assistance Equipment used: Rolling walker (2 wheeled) Transfers: Sit to/from Stand Sit to Stand: Min guard         General transfer comment: Pt able to perform sit<>stand x3- x1 from toilet, x1 from chair, x1 from bed with min guard. Occasional  VC for hand placement    Balance Overall balance assessment: Needs assistance Sitting-balance support: Feet supported;Single extremity supported;No upper extremity supported Sitting balance-Leahy Scale: Fair Sitting balance - Comments: Able to sit at EOB without UE support but prefers single UE support. Cannot tolerate challenges to balance.   Standing balance support: During functional activity;Bilateral upper extremity supported;No upper extremity supported Standing balance-Leahy Scale: Fair Standing balance comment: Able to stand statically without UE support but requires B UE support for dynamic activities.                   ADL Overall ADL's : Needs assistance/impaired     Grooming: Supervision/safety;Standing               Lower Body Dressing: Moderate assistance;Sit to/from stand   Toilet Transfer: Min Engineer, civil (consulting) Details (indicate cue type and reason): Initially required min guard but able to progress to supervision level.     Tub/ Shower Transfer: Minimal assistance;Ambulation;Shower seat;Walk-in shower   Functional mobility during ADLs: Supervision/safety;Min guard;Rolling walker General ADL Comments: Able to progress from min guard assist to supervision level for functional ambulation this session. Pt with much improved activity tolerance for ADL and required decreased assist this session.      Vision                     Perception     Praxis      Cognition   Behavior During Therapy: [facility] for tasks assessed/performed Overall Cognitive Status: Within Functional Limits for tasks assessed  Extremity/Trunk Assessment               Exercises Total Joint Exercises Hip ABduction/ADduction: AROM;Left;10 reps;Seated Long Arc Quad: AAROM;Left;10 reps;Seated Knee Flexion: AROM;Left;10 reps;Seated Goniometric ROM: 10 to 53   Shoulder Instructions       General Comments       Pertinent Vitals/ Pain       Pain Assessment: 0-10 Pain Score: 5  Pain Location: knee Pain Descriptors / Indicators: Aching Pain Intervention(s): Limited activity within patient's tolerance;Monitored during session;Repositioned  Home Living                                          Prior Functioning/Environment              Frequency  Min 2X/week        Progress Toward Goals  OT Goals(current goals can now be found in the care plan section)  Progress towards OT goals: Progressing toward goals  Acute Rehab OT Goals Patient Stated Goal: to be able to walk and be independent OT Goal Formulation: With patient Time For Goal Achievement: 10/09/16 Potential to Achieve Goals: Good ADL Goals Pt Will Perform Lower Body Bathing: with supervision;sit to/from stand Pt Will Perform Lower Body Dressing: with supervision;sit to/from stand Pt Will Transfer to Toilet: with supervision;ambulating;bedside commode Pt Will Perform Toileting - Clothing Manipulation and hygiene: with supervision;sit to/from stand Pt Will Perform Tub/Shower Transfer: Shower transfer;with supervision;shower seat;rolling walker;ambulating;3 in 1  Plan Discharge plan needs to be updated    Co-evaluation                 End of Session Equipment Utilized During Treatment: Gait belt;Rolling walker;Left knee immobilizer CPM Left Knee CPM Left Knee: On Left Knee Flexion (Degrees): 60 Left Knee Extension (Degrees): 0   Activity Tolerance Patient tolerated treatment well   Patient Left Other (comment) (With PT in rehab gym)   Nurse Communication          Time: [id number] OT Time Calculation (min): 18 min  Charges: OT General Charges $OT Visit: 1 Procedure OT Treatments $Self Care/Home Management : 8-22 mins  Norman Herrlich, OTR/L [phone] 10/03/2016, 9:40 AM

## 2016-10-04 LAB — CBC
HEMATOCRIT: 30.6 % — AB (ref 36.0–46.0)
Hemoglobin: 10.3 g/dL — ABNORMAL LOW (ref 12.0–15.0)
MCH: 30.8 pg (ref 26.0–34.0)
MCHC: 33.7 g/dL (ref 30.0–36.0)
MCV: 91.6 fL (ref 78.0–100.0)
PLATELETS: 132 10*3/uL — AB (ref 150–400)
RBC: 3.34 MIL/uL — AB (ref 3.87–5.11)
RDW: 12.6 % (ref 11.5–15.5)
WBC: 8.7 10*3/uL (ref 4.0–10.5)

## 2016-10-04 LAB — BASIC METABOLIC PANEL
Anion gap: 9 (ref 5–15)
BUN: <5 mg/dL — ABNORMAL LOW (ref 6–20)
CALCIUM: 8.5 mg/dL — AB (ref 8.9–10.3)
CO2: 29 mmol/L (ref 22–32)
Chloride: 98 mmol/L — ABNORMAL LOW (ref 101–111)
Creatinine, Ser: 0.63 mg/dL (ref 0.44–1.00)
Glucose, Bld: 118 mg/dL — ABNORMAL HIGH (ref 65–99)
Potassium: 3.4 mmol/L — ABNORMAL LOW (ref 3.5–5.1)
Sodium: 136 mmol/L (ref 135–145)

## 2016-10-04 NOTE — Progress Notes (Signed)
Orthopedic Tech Progress Note Patient Details:  Jessica Watts November 07, 1945 [id number]  Patient ID: Jessica Watts, female   DOB: Mar 12, 1946, 70 y.o.   MRN: [id number] Applied cpm 0-60  Karolee Stamps 10/04/2016, 6:49 AM

## 2016-10-04 NOTE — Progress Notes (Signed)
Physical Therapy Treatment Patient Details Name: Jessica Watts MRN: [id number] DOB: 27-Nov-1945 Today's Date: 10/04/2016    History of Present Illness Pt presents with L TKA on 10/01/2016. PMH of lumbar fusion, cervical fusion, shoulder surgery, neuroma surgery (L foot), COPD, fibromyalgia, migraines, lupus, macular degeneration (L eye), and SOB     PT Comments    Pt admitted with above diagnosis. Pt currently with functional limitations due to balance and endurance as well as strength deficits. Pt was able to ambulate with good sequencing of steps and RW.  No LOB with supervision only for gait.  Only needed a little asssist to bring LE off bed.  Sister and brother in law are assisting pt at d/c as she is going to their house to recover.  Has no steps per pt.  Progressing.  HHPT f/u recommended.  Pt will benefit from skilled PT to increase their independence and safety with mobility to allow discharge to the venue listed below.    Follow Up Recommendations  Home health PT     Equipment Recommendations  Rolling walker with 5" wheels;3in1 (PT)    Recommendations for Other Services       Precautions / Restrictions Precautions Precautions: Knee;Fall Precaution Booklet Issued: No Precaution Comments: Reviewed knee precautions Required Braces or Orthoses: Knee Immobilizer - Left Knee Immobilizer - Left: Discontinue post op day 2 (per  Dr. Lorin Mercy who came in and stated she no longer needs) Restrictions Weight Bearing Restrictions: Yes LLE Weight Bearing: Weight bearing as tolerated    Mobility  Bed Mobility Overal bed mobility: Needs Assistance Bed Mobility: Supine to Sit     Supine to sit: Min guard     General bed mobility comments: Pt required min assist for LE mobility out of bed. VC for sequencing  Transfers Overall transfer level: Needs assistance Equipment used: Rolling walker (2 wheeled) Transfers: Sit to/from Stand Sit to Stand: Supervision         General  transfer comment: Pt able to perform sit<>stand from bed, x1 from toilet with supervision.  Pt was able to steady herself without any help.  Pt able to clean herself when getting off toilet.  Also walked to sink and washed hands with supervision only.   Ambulation/Gait Ambulation/Gait assistance: Supervision Ambulation Distance (Feet): 250 Feet Assistive device: Rolling walker (2 wheeled) Gait Pattern/deviations: Step-to pattern;Decreased weight shift to left;Antalgic Gait velocity: decreased Gait velocity interpretation: Below normal speed for age/gender General Gait Details: Pt with good sequencing of steps and RW.  Removed knee immobilizer per MD request and pt did well without it.  Pt is safe with RW.     Stairs            Wheelchair Mobility    Modified Rankin (Stroke Patients Only)       Balance Overall balance assessment: Needs assistance Sitting-balance support: Feet supported;No upper extremity supported Sitting balance-Leahy Scale: Good     Standing balance support: During functional activity;No upper extremity supported Standing balance-Leahy Scale: Fair Standing balance comment: Able to toilet herself with supervision.  Able to stand while wiping without physical assist.  Pt also stood at sink to wash hands without support.               High level balance activites: Direction changes;Turns;Sudden stops High Level Balance Comments: supervisionwith above    Cognition Arousal/Alertness: Awake/alert Behavior During Therapy: WFL for tasks assessed/performed Overall Cognitive Status: Within Functional Limits for tasks assessed  Exercises Total Joint Exercises Ankle Circles/Pumps: AROM;Left;10 reps;Seated Quad Sets: AROM;Both;15 reps;Seated Gluteal Sets: AROM;Both;Seated;15 reps Heel Slides: AROM;Left;Seated;10 reps;AAROM Straight Leg Raises: AAROM;Left;10 reps;Supine (2x5) Long Arc Quad: AAROM;Left;10 reps;Seated Knee  Flexion: AROM;Left;10 reps;Seated Goniometric ROM: 7 to 72    General Comments        Pertinent Vitals/Pain Pain Assessment: 0-10 Pain Score: 3  Pain Location: knee Pain Descriptors / Indicators: Aching;Discomfort Pain Intervention(s): Limited activity within patient's tolerance;Monitored during session;Premedicated before session;Repositioned;Ice applied  VSS    Home Living                      Prior Function            PT Goals (current goals can now be found in the care plan section) Acute Rehab PT Goals Patient Stated Goal: to be able to walk and be independent Progress towards PT goals: Progressing toward goals    Frequency    7X/week      PT Plan Current plan remains appropriate    Co-evaluation             End of Session Equipment Utilized During Treatment: Gait belt;Left knee immobilizer (does not have to wear per MD) Activity Tolerance: Patient limited by pain Patient left: with call bell/phone within reach;in bed     Time: 0755-0833 PT Time Calculation (min) (ACUTE ONLY): 38 min  Charges:  $Gait Training: 8-22 mins $Therapeutic Exercise: 8-22 mins $Self Care/Home Management: 8-22                    G Codes:      Denice Paradise 2016/10/14, 8:43 AM Amanda [facility] Acute Rehabilitation [phone] (pager)

## 2016-10-05 DIAGNOSIS — E785 Hyperlipidemia, unspecified: Secondary | ICD-10-CM | POA: Diagnosis not present

## 2016-10-05 DIAGNOSIS — Z7982 Long term (current) use of aspirin: Secondary | ICD-10-CM | POA: Diagnosis not present

## 2016-10-05 DIAGNOSIS — J449 Chronic obstructive pulmonary disease, unspecified: Secondary | ICD-10-CM | POA: Diagnosis not present

## 2016-10-05 DIAGNOSIS — I10 Essential (primary) hypertension: Secondary | ICD-10-CM | POA: Diagnosis not present

## 2016-10-05 DIAGNOSIS — J45909 Unspecified asthma, uncomplicated: Secondary | ICD-10-CM | POA: Diagnosis not present

## 2016-10-05 DIAGNOSIS — Z96652 Presence of left artificial knee joint: Secondary | ICD-10-CM | POA: Diagnosis not present

## 2016-10-05 DIAGNOSIS — G4733 Obstructive sleep apnea (adult) (pediatric): Secondary | ICD-10-CM | POA: Diagnosis not present

## 2016-10-05 DIAGNOSIS — Z471 Aftercare following joint replacement surgery: Secondary | ICD-10-CM | POA: Diagnosis not present

## 2016-10-05 DIAGNOSIS — Z7951 Long term (current) use of inhaled steroids: Secondary | ICD-10-CM | POA: Diagnosis not present

## 2016-10-05 DIAGNOSIS — M1991 Primary osteoarthritis, unspecified site: Secondary | ICD-10-CM | POA: Diagnosis not present

## 2016-10-08 DIAGNOSIS — Z96652 Presence of left artificial knee joint: Secondary | ICD-10-CM | POA: Diagnosis not present

## 2016-10-08 DIAGNOSIS — Z7982 Long term (current) use of aspirin: Secondary | ICD-10-CM | POA: Diagnosis not present

## 2016-10-08 DIAGNOSIS — Z471 Aftercare following joint replacement surgery: Secondary | ICD-10-CM | POA: Diagnosis not present

## 2016-10-08 DIAGNOSIS — J449 Chronic obstructive pulmonary disease, unspecified: Secondary | ICD-10-CM | POA: Diagnosis not present

## 2016-10-08 DIAGNOSIS — Z7951 Long term (current) use of inhaled steroids: Secondary | ICD-10-CM | POA: Diagnosis not present

## 2016-10-08 DIAGNOSIS — I10 Essential (primary) hypertension: Secondary | ICD-10-CM | POA: Diagnosis not present

## 2016-10-08 DIAGNOSIS — M1991 Primary osteoarthritis, unspecified site: Secondary | ICD-10-CM | POA: Diagnosis not present

## 2016-10-08 DIAGNOSIS — J45909 Unspecified asthma, uncomplicated: Secondary | ICD-10-CM | POA: Diagnosis not present

## 2016-10-08 DIAGNOSIS — E785 Hyperlipidemia, unspecified: Secondary | ICD-10-CM | POA: Diagnosis not present

## 2016-10-08 DIAGNOSIS — G4733 Obstructive sleep apnea (adult) (pediatric): Secondary | ICD-10-CM | POA: Diagnosis not present

## 2016-10-10 DIAGNOSIS — I10 Essential (primary) hypertension: Secondary | ICD-10-CM | POA: Diagnosis not present

## 2016-10-10 DIAGNOSIS — J449 Chronic obstructive pulmonary disease, unspecified: Secondary | ICD-10-CM | POA: Diagnosis not present

## 2016-10-10 DIAGNOSIS — M1991 Primary osteoarthritis, unspecified site: Secondary | ICD-10-CM | POA: Diagnosis not present

## 2016-10-10 DIAGNOSIS — G4733 Obstructive sleep apnea (adult) (pediatric): Secondary | ICD-10-CM | POA: Diagnosis not present

## 2016-10-10 DIAGNOSIS — Z96652 Presence of left artificial knee joint: Secondary | ICD-10-CM | POA: Diagnosis not present

## 2016-10-10 DIAGNOSIS — Z7982 Long term (current) use of aspirin: Secondary | ICD-10-CM | POA: Diagnosis not present

## 2016-10-10 DIAGNOSIS — Z7951 Long term (current) use of inhaled steroids: Secondary | ICD-10-CM | POA: Diagnosis not present

## 2016-10-10 DIAGNOSIS — J45909 Unspecified asthma, uncomplicated: Secondary | ICD-10-CM | POA: Diagnosis not present

## 2016-10-10 DIAGNOSIS — Z471 Aftercare following joint replacement surgery: Secondary | ICD-10-CM | POA: Diagnosis not present

## 2016-10-10 DIAGNOSIS — E785 Hyperlipidemia, unspecified: Secondary | ICD-10-CM | POA: Diagnosis not present

## 2016-10-14 DIAGNOSIS — Z471 Aftercare following joint replacement surgery: Secondary | ICD-10-CM | POA: Diagnosis not present

## 2016-10-14 DIAGNOSIS — J449 Chronic obstructive pulmonary disease, unspecified: Secondary | ICD-10-CM | POA: Diagnosis not present

## 2016-10-14 DIAGNOSIS — G4733 Obstructive sleep apnea (adult) (pediatric): Secondary | ICD-10-CM | POA: Diagnosis not present

## 2016-10-14 DIAGNOSIS — Z7982 Long term (current) use of aspirin: Secondary | ICD-10-CM | POA: Diagnosis not present

## 2016-10-14 DIAGNOSIS — Z7951 Long term (current) use of inhaled steroids: Secondary | ICD-10-CM | POA: Diagnosis not present

## 2016-10-14 DIAGNOSIS — M1991 Primary osteoarthritis, unspecified site: Secondary | ICD-10-CM | POA: Diagnosis not present

## 2016-10-14 DIAGNOSIS — J45909 Unspecified asthma, uncomplicated: Secondary | ICD-10-CM | POA: Diagnosis not present

## 2016-10-14 DIAGNOSIS — E785 Hyperlipidemia, unspecified: Secondary | ICD-10-CM | POA: Diagnosis not present

## 2016-10-14 DIAGNOSIS — I10 Essential (primary) hypertension: Secondary | ICD-10-CM | POA: Diagnosis not present

## 2016-10-14 DIAGNOSIS — Z96652 Presence of left artificial knee joint: Secondary | ICD-10-CM | POA: Diagnosis not present

## 2016-10-15 ENCOUNTER — Encounter (INDEPENDENT_AMBULATORY_CARE_PROVIDER_SITE_OTHER): Payer: Self-pay

## 2016-10-15 ENCOUNTER — Encounter (INDEPENDENT_AMBULATORY_CARE_PROVIDER_SITE_OTHER): Payer: Self-pay | Admitting: Orthopaedic Surgery

## 2016-10-15 ENCOUNTER — Ambulatory Visit (INDEPENDENT_AMBULATORY_CARE_PROVIDER_SITE_OTHER): Payer: PPO

## 2016-10-15 ENCOUNTER — Ambulatory Visit (INDEPENDENT_AMBULATORY_CARE_PROVIDER_SITE_OTHER): Payer: PPO | Admitting: Orthopaedic Surgery

## 2016-10-15 VITALS — BP 155/78 | HR 75 | Ht 60.0 in | Wt 209.0 lb

## 2016-10-15 DIAGNOSIS — J45909 Unspecified asthma, uncomplicated: Secondary | ICD-10-CM | POA: Diagnosis not present

## 2016-10-15 DIAGNOSIS — Z7951 Long term (current) use of inhaled steroids: Secondary | ICD-10-CM | POA: Diagnosis not present

## 2016-10-15 DIAGNOSIS — E785 Hyperlipidemia, unspecified: Secondary | ICD-10-CM | POA: Diagnosis not present

## 2016-10-15 DIAGNOSIS — G4733 Obstructive sleep apnea (adult) (pediatric): Secondary | ICD-10-CM | POA: Diagnosis not present

## 2016-10-15 DIAGNOSIS — M1712 Unilateral primary osteoarthritis, left knee: Secondary | ICD-10-CM

## 2016-10-15 DIAGNOSIS — Z471 Aftercare following joint replacement surgery: Secondary | ICD-10-CM | POA: Diagnosis not present

## 2016-10-15 DIAGNOSIS — J449 Chronic obstructive pulmonary disease, unspecified: Secondary | ICD-10-CM | POA: Diagnosis not present

## 2016-10-15 DIAGNOSIS — Z7982 Long term (current) use of aspirin: Secondary | ICD-10-CM | POA: Diagnosis not present

## 2016-10-15 DIAGNOSIS — Z96652 Presence of left artificial knee joint: Secondary | ICD-10-CM | POA: Diagnosis not present

## 2016-10-15 DIAGNOSIS — M1991 Primary osteoarthritis, unspecified site: Secondary | ICD-10-CM | POA: Diagnosis not present

## 2016-10-15 DIAGNOSIS — I10 Essential (primary) hypertension: Secondary | ICD-10-CM | POA: Diagnosis not present

## 2016-10-15 NOTE — Progress Notes (Signed)
Post-Op Visit Note   Patient: Jessica Watts           Date of Birth: 1946/02/26           MRN: [id number] Visit Date: 10/15/2016 PCP: Osborne Casco, MD   Assessment & Plan:  Chief Complaint:  Chief Complaint  Patient presents with  . Left Knee - Routine Post Op   Visit Diagnoses:  1. Unilateral primary osteoarthritis, left knee            Postop total knee arthroplasty left knee.  Plan: Prescription for outpatient therapy written. Percocet refilled. Recheck 4 weeks.  Follow-Up Instructions: No Follow-up on file.   Orders:  Orders Placed This Encounter  Procedures  . XR Knee 1-2 Views Left   No orders of the defined types were placed in this encounter.  HPI Patient returns status post left total knee arthroplasty on 10/01/16. She is 2 weeks post op. She is doing well. PT has been going well. Ambulates with walker. Needs refill on Oxycodone.  PMFS History: Patient Active Problem List   Diagnosis Date Noted  . Arthritis of left knee 10/01/2016  . OSA (obstructive sleep apnea) 04/22/2016  . SVT (supraventricular tachycardia) ([place]) 11/08/2015  . SOB (shortness of breath) 08/23/2015  . Allergic rhinitis 08/22/2015  . Absolute anemia 08/22/2015  . Essential (primary) hypertension 08/22/2015  . Gastro-esophageal reflux disease without esophagitis 08/22/2015  . Hereditary and idiopathic neuropathy 08/22/2015  . Migraine with typical aura 08/22/2015  . Unilateral primary osteoarthritis, left knee 08/22/2015  . Borderline diabetes mellitus 08/22/2015  . Pure hypercholesterolemia 08/22/2015  . Systemic sclerosis ([place]) 08/22/2015  . Fast heart beat 08/22/2015  . Absence of bladder continence 08/22/2015  . Apnea, sleep 07/16/2015  . Cataract 09/26/2013  . Cataract of left eye 08/29/2013  . Cervical radiculopathy 07/20/2012  . Ulnar neuropathy at elbow 07/20/2012   Past Medical History:  Diagnosis Date  . Arthritis   . Asthma   . Complication of  anesthesia    has difficulties related to being Native American; multiple days in hospital after hysterectomy years ago  . COPD (chronic obstructive pulmonary disease) ([place])   . Depression    in the past  . Fibromyalgia   . GERD (gastroesophageal reflux disease)   . Headache(784.0)    migraines  . Hyperlipidemia   . Hypertension   . Lupus   . Macular degeneration    left eye  . Migraine   . Neuropathy ([place])   . OSA (obstructive sleep apnea) 04/22/2016   Moderate with AHI 16/hr on CPAP  . Peptic ulcer disease   . Restless legs   . Shortness of breath dyspnea   . Sleep apnea    last sleep study 2000, not using CPAP  . SVT (supraventricular tachycardia) ([place]) 12/03/2015   ablation for SVT    Family History  Problem Relation Age of Onset  . Heart disease Mother   . Heart attack Mother   . Hypertension Mother   . Heart attack Father   . Heart disease Father   . Hypertension Father     Past Surgical History:  Procedure Laterality Date  . ABDOMINAL HYSTERECTOMY    . ABLATION OF DYSRHYTHMIC FOCUS  12/03/2015   SVT  . ANTERIOR CERVICAL DECOMP/DISCECTOMY FUSION  07/20/2012   Procedure: ANTERIOR CERVICAL DECOMPRESSION/DISCECTOMY FUSION 2 LEVELS;  Surgeon: Charlie Pitter, MD;  Location: [facility];  Service: Neurosurgery;  Laterality: N/A;  cervical three- four , four -  five  . APPENDECTOMY    . BACK SURGERY     lumbar fusion  . CARDIAC CATHETERIZATION N/A 09/14/2015   Procedure: Left Heart Cath and Coronary Angiography;  Surgeon: Sherren Mocha, MD;  Location: [facility];  Service: Cardiovascular;  Laterality: N/A;  . CARPAL TUNNEL RELEASE     bilat  . CERVICAL FUSION     C2/3  . ELECTROPHYSIOLOGIC STUDY N/A 12/03/2015   Procedure: SVT Ablation;  Surgeon: Evans Lance, MD;  Location: [facility];  Service: Cardiovascular;  Laterality: N/A;  . EYE SURGERY Bilateral    cataract surgery with lens implant  . KNEE ARTHROSCOPY Left   . NEUROMA SURGERY     L foot    . SHOULDER SURGERY    . TONSILLECTOMY    . TOTAL KNEE ARTHROPLASTY Left 10/01/2016   Procedure: LEFT TOTAL KNEE ARTHROPLASTY;  Surgeon: Marybelle Killings, MD;  Location: [facility];  Service: Orthopedics;  Laterality: Left;  . ULNAR NERVE TRANSPOSITION  07/20/2012   Procedure: ULNAR NERVE DECOMPRESSION/TRANSPOSITION;  Surgeon: Charlie Pitter, MD;  Location: [facility];  Service: Neurosurgery;  Laterality: Left;   Social History   Occupational History  . Not on file.   Social History Main Topics  . Smoking status: Never Smoker  . Smokeless tobacco: Never Used  . Alcohol use Yes     Comment: occasional wine  . Drug use: No  . Sexual activity: Not on file

## 2016-10-16 ENCOUNTER — Telehealth (INDEPENDENT_AMBULATORY_CARE_PROVIDER_SITE_OTHER): Payer: Self-pay | Admitting: Orthopaedic Surgery

## 2016-10-16 NOTE — Telephone Encounter (Signed)
fyi

## 2016-10-16 NOTE — Telephone Encounter (Signed)
[facility] calling to let you know pt declined OT evaulation    [phone] [place]

## 2016-10-17 DIAGNOSIS — E785 Hyperlipidemia, unspecified: Secondary | ICD-10-CM | POA: Diagnosis not present

## 2016-10-17 DIAGNOSIS — Z471 Aftercare following joint replacement surgery: Secondary | ICD-10-CM | POA: Diagnosis not present

## 2016-10-17 DIAGNOSIS — G4733 Obstructive sleep apnea (adult) (pediatric): Secondary | ICD-10-CM | POA: Diagnosis not present

## 2016-10-17 DIAGNOSIS — Z96652 Presence of left artificial knee joint: Secondary | ICD-10-CM | POA: Diagnosis not present

## 2016-10-17 DIAGNOSIS — M1991 Primary osteoarthritis, unspecified site: Secondary | ICD-10-CM | POA: Diagnosis not present

## 2016-10-17 DIAGNOSIS — Z7982 Long term (current) use of aspirin: Secondary | ICD-10-CM | POA: Diagnosis not present

## 2016-10-17 DIAGNOSIS — I10 Essential (primary) hypertension: Secondary | ICD-10-CM | POA: Diagnosis not present

## 2016-10-17 DIAGNOSIS — Z7951 Long term (current) use of inhaled steroids: Secondary | ICD-10-CM | POA: Diagnosis not present

## 2016-10-17 DIAGNOSIS — J45909 Unspecified asthma, uncomplicated: Secondary | ICD-10-CM | POA: Diagnosis not present

## 2016-10-17 DIAGNOSIS — J449 Chronic obstructive pulmonary disease, unspecified: Secondary | ICD-10-CM | POA: Diagnosis not present

## 2016-10-20 DIAGNOSIS — Z96652 Presence of left artificial knee joint: Secondary | ICD-10-CM | POA: Diagnosis not present

## 2016-10-20 DIAGNOSIS — J45909 Unspecified asthma, uncomplicated: Secondary | ICD-10-CM | POA: Diagnosis not present

## 2016-10-20 DIAGNOSIS — G4733 Obstructive sleep apnea (adult) (pediatric): Secondary | ICD-10-CM | POA: Diagnosis not present

## 2016-10-20 DIAGNOSIS — I10 Essential (primary) hypertension: Secondary | ICD-10-CM | POA: Diagnosis not present

## 2016-10-20 DIAGNOSIS — J449 Chronic obstructive pulmonary disease, unspecified: Secondary | ICD-10-CM | POA: Diagnosis not present

## 2016-10-20 DIAGNOSIS — Z7951 Long term (current) use of inhaled steroids: Secondary | ICD-10-CM | POA: Diagnosis not present

## 2016-10-20 DIAGNOSIS — Z471 Aftercare following joint replacement surgery: Secondary | ICD-10-CM | POA: Diagnosis not present

## 2016-10-20 DIAGNOSIS — Z7982 Long term (current) use of aspirin: Secondary | ICD-10-CM | POA: Diagnosis not present

## 2016-10-20 DIAGNOSIS — E785 Hyperlipidemia, unspecified: Secondary | ICD-10-CM | POA: Diagnosis not present

## 2016-10-20 DIAGNOSIS — M1991 Primary osteoarthritis, unspecified site: Secondary | ICD-10-CM | POA: Diagnosis not present

## 2016-10-22 DIAGNOSIS — G4733 Obstructive sleep apnea (adult) (pediatric): Secondary | ICD-10-CM | POA: Diagnosis not present

## 2016-10-22 DIAGNOSIS — M1991 Primary osteoarthritis, unspecified site: Secondary | ICD-10-CM | POA: Diagnosis not present

## 2016-10-22 DIAGNOSIS — I10 Essential (primary) hypertension: Secondary | ICD-10-CM | POA: Diagnosis not present

## 2016-10-22 DIAGNOSIS — J449 Chronic obstructive pulmonary disease, unspecified: Secondary | ICD-10-CM | POA: Diagnosis not present

## 2016-10-22 DIAGNOSIS — Z471 Aftercare following joint replacement surgery: Secondary | ICD-10-CM | POA: Diagnosis not present

## 2016-10-22 DIAGNOSIS — Z7982 Long term (current) use of aspirin: Secondary | ICD-10-CM | POA: Diagnosis not present

## 2016-10-22 DIAGNOSIS — J45909 Unspecified asthma, uncomplicated: Secondary | ICD-10-CM | POA: Diagnosis not present

## 2016-10-22 DIAGNOSIS — Z96652 Presence of left artificial knee joint: Secondary | ICD-10-CM | POA: Diagnosis not present

## 2016-10-22 DIAGNOSIS — Z7951 Long term (current) use of inhaled steroids: Secondary | ICD-10-CM | POA: Diagnosis not present

## 2016-10-22 DIAGNOSIS — E785 Hyperlipidemia, unspecified: Secondary | ICD-10-CM | POA: Diagnosis not present

## 2016-10-23 ENCOUNTER — Encounter: Payer: Self-pay | Admitting: Physical Therapy

## 2016-10-23 ENCOUNTER — Ambulatory Visit: Payer: PPO | Attending: Orthopaedic Surgery | Admitting: Physical Therapy

## 2016-10-23 DIAGNOSIS — M6281 Muscle weakness (generalized): Secondary | ICD-10-CM | POA: Insufficient documentation

## 2016-10-23 DIAGNOSIS — M25662 Stiffness of left knee, not elsewhere classified: Secondary | ICD-10-CM | POA: Insufficient documentation

## 2016-10-23 NOTE — Therapy (Signed)
[place], [place], [address] Phone: [phone]   Fax:  [phone]  Physical Therapy Evaluation  Patient Details  Name: Jessica Watts MRN: [id number] Date of Birth: 10-18-1945 Referring Provider: Rodell Perna, MD  Encounter Date: 10/23/2016      PT End of Session - 10/23/16 1523    Visit Number 1   Number of Visits 9   Date for PT Re-Evaluation 11/21/16   Authorization Type MCR KX at visit 15   PT Start Time 1518   PT Stop Time 1606   PT Time Calculation (min) 48 min   Activity Tolerance Patient tolerated treatment well   Behavior During Therapy [facility] for tasks assessed/performed      Past Medical History:  Diagnosis Date  . Arthritis   . Asthma   . Complication of anesthesia    has difficulties related to being Native American; multiple days in hospital after hysterectomy years ago  . COPD (chronic obstructive pulmonary disease) ([place])   . Depression    in the past  . Fibromyalgia   . GERD (gastroesophageal reflux disease)   . Headache(784.0)    migraines  . Hyperlipidemia   . Hypertension   . Lupus   . Macular degeneration    left eye  . Migraine   . Neuropathy ([place])   . OSA (obstructive sleep apnea) 04/22/2016   Moderate with AHI 16/hr on CPAP  . Peptic ulcer disease   . Restless legs   . Shortness of breath dyspnea   . Sleep apnea    last sleep study 2000, not using CPAP  . SVT (supraventricular tachycardia) ([place]) 12/03/2015   ablation for SVT    Past Surgical History:  Procedure Laterality Date  . ABDOMINAL HYSTERECTOMY    . ABLATION OF DYSRHYTHMIC FOCUS  12/03/2015   SVT  . ANTERIOR CERVICAL DECOMP/DISCECTOMY FUSION  07/20/2012   Procedure: ANTERIOR CERVICAL DECOMPRESSION/DISCECTOMY FUSION 2 LEVELS;  Surgeon: Charlie Pitter, MD;  Location: [facility];  Service: Neurosurgery;  Laterality: N/A;  cervical three- four , four -  five  . APPENDECTOMY    . BACK SURGERY      lumbar fusion  . CARDIAC CATHETERIZATION N/A 09/14/2015   Procedure: Left Heart Cath and Coronary Angiography;  Surgeon: Sherren Mocha, MD;  Location: [facility];  Service: Cardiovascular;  Laterality: N/A;  . CARPAL TUNNEL RELEASE     bilat  . CERVICAL FUSION     C2/3  . ELECTROPHYSIOLOGIC STUDY N/A 12/03/2015   Procedure: SVT Ablation;  Surgeon: Evans Lance, MD;  Location: [facility];  Service: Cardiovascular;  Laterality: N/A;  . EYE SURGERY Bilateral    cataract surgery with lens implant  . KNEE ARTHROSCOPY Left   . NEUROMA SURGERY     L foot  . SHOULDER SURGERY    . TONSILLECTOMY    . TOTAL KNEE ARTHROPLASTY Left 10/01/2016   Procedure: LEFT TOTAL KNEE ARTHROPLASTY;  Surgeon: Marybelle Killings, MD;  Location: [facility];  Service: Orthopedics;  Laterality: Left;  . ULNAR NERVE TRANSPOSITION  07/20/2012   Procedure: ULNAR NERVE DECOMPRESSION/TRANSPOSITION;  Surgeon: Charlie Pitter, MD;  Location: [facility];  Service: Neurosurgery;  Laterality: Left;    There were no vitals filed for this visit.       Subjective Assessment - 10/23/16 1526    Subjective Pt reports knee is doing extremely well. Gets up and walks/exercises every 30 min throughout the day.  Ambulates with straight cane. Took medication about 2:00 so does not have pain right now. Standing after sitting for a while results in stiffness. Has begun showering on her own with chair available.    How long can you walk comfortably? does not feel limited   Patient Stated Goals maneuver stairs at home, walking for exercise.    Currently in Pain? No/denies            [facility] PT Assessment - 10/23/16 0001      Assessment   Medical Diagnosis L TKA   Referring Provider Rodell Perna, MD   Onset Date/Surgical Date 10/01/16   Hand Dominance Right   Next MD Visit 11/12/16   Prior Therapy home health- 8 visits     Precautions   Precautions None     Restrictions   Weight Bearing Restrictions No     Balance Screen    Has the patient fallen in the past 6 months No     [place] residence   [place]  currently staying with sister   Additional Comments multi level home     Prior Function   Level of Independence Independent     Cognition   Overall Cognitive Status Within Functional Limits for tasks assessed     Observation/Other Assessments   Focus on Therapeutic Outcomes (FOTO)  51% ability     Sensation   Additional Comments decreased sensation to touch L lateral knee     ROM / Strength   AROM / PROM / Strength PROM;Strength     PROM   PROM Assessment Site Knee   Right/Left Knee Right;Left   Left Knee Extension -8   Left Knee Flexion 116     Strength   Strength Assessment Site Knee;Hip   Right/Left Hip Right;Left   Right Hip Flexion 4/5   Right Hip Extension 4+/5   Right Hip ABduction 4+/5   Left Hip Flexion 3+/5  quad lag approx 3 deg   Left Hip Extension 4-/5   Left Hip ABduction 4/5   Right/Left Knee Right;Left   Right Knee Flexion 5/5   Right Knee Extension 5/5   Left Knee Flexion 4/5   Left Knee Extension 5/5     Palpation   Patella mobility WFL   Palpation comment denies TTP, limited scar mobility     Transfers   Comments 15 sit to stand in 30s     Ambulation/Gait   Assistive device Straight cane   Gait Pattern Right foot flat;Left foot flat   Stairs Yes   Stair Management Technique Two rails;Alternating pattern  poor L eccentric control in descent   Number of Stairs 4   Gait Comments no antalgic pattern noted                   [facility] Adult PT Treatment/Exercise - 10/23/16 0001      Exercises   Exercises Knee/Hip;Other Exercises   Other Exercises  review/discussion of home health exercises     Knee/Hip Exercises: Stretches   Passive Hamstring Stretch Limitations seated heel prop     Knee/Hip Exercises: Standing   Step Down Limitations stair navigation   Gait Training heel toe pattern      Modalities   Modalities Cryotherapy     Cryotherapy   Number Minutes Cryotherapy 10 Minutes   Cryotherapy Location Knee  L, in heel prop   Type of Cryotherapy Ice pack  PT Education - 10/23/16 1600    Education provided Yes   Education Details anatomy of condition, POC, HEP, exercise form/rationale, continue home health exercises   Person(s) Educated Patient   Methods Explanation;Demonstration;Tactile cues;Verbal cues   Comprehension Verbalized understanding;Returned demonstration;Verbal cues required;Tactile cues required;Need further instruction          PT Short Term Goals - 10/23/16 1605      PT SHORT TERM GOAL #1   Title FOTO to 59% ability to indicate significant improvement in functional ability by 2/9   Baseline 51% ability at eval   Time 4   Period Weeks   Status New     PT SHORT TERM GOAL #2   Title Pt will demo good eccentric control to descend stairs, able with only use of one upper extremity for balance for safe navigation of stairs at home   Baseline use of bilat upper extremities and poor control   Time 4   Period Weeks   Status New     PT SHORT TERM GOAL #3   Title 0-120 deg knee ROM for proper functional range   Baseline see flowsheet   Time 4   Period Weeks   Status New     PT SHORT TERM GOAL #4   Title hip MMT all to at least 4+/5 to indicate proximal stability for distal control.    Baseline see flowsheet   Time 4   Period Weeks   Status New                  Plan - 10/23/16 1601    Clinical Impression Statement Pt presents to PT with complaints of limitations presented by TKA 3 weeks ago, minimal pain limitations. Pt reports difficulty descending steps which is her biggest goal, to return home and safely navigate stairs. Pt has good MMT strength but lacks desirable ROM which was discussed. Pt will benefit from skilled PT in order to appropriately stretch and strengthen leg to reach functional goals.     Rehab Potential Good   PT Frequency 2x / week   PT Duration 4 weeks   PT Treatment/Interventions ADLs/Self Care Home Management;Cryotherapy;Electrical Stimulation;Functional mobility training;Stair training;Gait training;Moist Heat;Therapeutic activities;Therapeutic exercise;Balance training;Neuromuscular re-education;Patient/family education;Passive range of motion;Scar mobilization;Manual techniques;Taping;Vasopneumatic Device   PT Next Visit Plan ROM, eccentric control, hip strengthening   PT Home Exercise Plan home health exercises, heel prop, heel-toe gait   Consulted and Agree with Plan of Care Patient      Patient will benefit from skilled therapeutic intervention in order to improve the following deficits and impairments:  Decreased range of motion, Decreased activity tolerance, Impaired flexibility, Increased edema, Decreased strength  Visit Diagnosis: Stiffness of left knee, not elsewhere classified - Plan: PT plan of care cert/re-cert  Muscle weakness (generalized) - Plan: PT plan of care cert/re-cert      G-Codes - 0000000 1608    Functional Assessment Tool Used FOTO 51% ability (goal 59%), clinical judgement   Functional Limitation Mobility: Walking and moving around   Mobility: Walking and Moving Around Current Status [phone]) At least 40 percent but less than 60 percent impaired, limited or restricted   Mobility: Walking and Moving Around Goal Status [phone]) At least 20 percent but less than 40 percent impaired, limited or restricted       Problem List Patient Active Problem List   Diagnosis Date Noted  . Arthritis of left knee 10/01/2016  . OSA (obstructive sleep apnea) 04/22/2016  . SVT (supraventricular tachycardia) ([place])  11/08/2015  . SOB (shortness of breath) 08/23/2015  . Allergic rhinitis 08/22/2015  . Absolute anemia 08/22/2015  . Essential (primary) hypertension 08/22/2015  . Gastro-esophageal reflux disease without esophagitis 08/22/2015  . Hereditary  and idiopathic neuropathy 08/22/2015  . Migraine with typical aura 08/22/2015  . Unilateral primary osteoarthritis, left knee 08/22/2015  . Borderline diabetes mellitus 08/22/2015  . Pure hypercholesterolemia 08/22/2015  . Systemic sclerosis ([place]) 08/22/2015  . Fast heart beat 08/22/2015  . Absence of bladder continence 08/22/2015  . Apnea, sleep 07/16/2015  . Cataract 09/26/2013  . Cataract of left eye 08/29/2013  . Cervical radiculopathy 07/20/2012  . Ulnar neuropathy at elbow 07/20/2012    Elizabethanne Lusher C. Ayrianna Mcginniss PT, DPT 10/23/16 4:10 PM   [facility] [address] [place], [place], [address] Phone: [phone]   Fax:  [phone]  Name: Jessica Watts MRN: [id number] Date of Birth: 11-10-1945

## 2016-10-23 NOTE — Discharge Summary (Signed)
Patient ID: Jessica Watts MRN: [id number] DOB/AGE: 05/10/1946 71 y.o.  Admit date: 10/01/2016 Discharge date: 10/23/2016  Admission Diagnoses:  Active Problems:   Unilateral primary osteoarthritis, left knee   Arthritis of left knee   Discharge Diagnoses:  Active Problems:   Unilateral primary osteoarthritis, left knee   Arthritis of left knee  status post Procedure(s): LEFT TOTAL KNEE ARTHROPLASTY  Past Medical History:  Diagnosis Date  . Arthritis   . Asthma   . Complication of anesthesia    has difficulties related to being Native American; multiple days in hospital after hysterectomy years ago  . COPD (chronic obstructive pulmonary disease) ([place])   . Depression    in the past  . Fibromyalgia   . GERD (gastroesophageal reflux disease)   . Headache(784.0)    migraines  . Hyperlipidemia   . Hypertension   . Lupus   . Macular degeneration    left eye  . Migraine   . Neuropathy ([place])   . OSA (obstructive sleep apnea) 04/22/2016   Moderate with AHI 16/hr on CPAP  . Peptic ulcer disease   . Restless legs   . Shortness of breath dyspnea   . Sleep apnea    last sleep study 2000, not using CPAP  . SVT (supraventricular tachycardia) ([place]) 12/03/2015   ablation for SVT    Surgeries: Procedure(s): LEFT TOTAL KNEE ARTHROPLASTY on 10/01/2016   Consultants:   Discharged Condition: Improved  Hospital Course: Jessica Watts is an 71 y.o. female who was admitted 10/01/2016 for operative treatment of knee DJD. Patient failed conservative treatments (please see the history and physical for the specifics) and had severe unremitting pain that affects sleep, daily activities and work/hobbies. After pre-op clearance, the patient was taken to the operating room on 10/01/2016 and underwent  Procedure(s): LEFT TOTAL KNEE ARTHROPLASTY.    Patient was given perioperative antibiotics:  Anti-infectives    Start     Dose/Rate Route Frequency Ordered Stop   10/01/16 1700   vancomycin (VANCOCIN) IVPB 1000 mg/200 mL premix     1,000 mg 200 mL/hr over 60 Minutes Intravenous Every 12 hours 10/01/16 1644 10/01/16 1919   10/01/16 1008  clindamycin (CLEOCIN) 900 MG/50ML IVPB    Comments:  Jones, Tomika   : cabinet override      10/01/16 1008 10/01/16 1318   10/01/16 1004  clindamycin (CLEOCIN) IVPB 900 mg     900 mg 100 mL/hr over 30 Minutes Intravenous On call to O.R. 10/01/16 1004 10/01/16 1318       Patient was given sequential compression devices and early ambulation to prevent DVT.   Patient benefited maximally from hospital stay and there were no complications. At the time of discharge, the patient was urinating/moving their bowels without difficulty, tolerating a regular diet, pain is controlled with oral pain medications and they have been cleared by PT/OT.   Recent vital signs: No data found.    Recent laboratory studies: No results for input(s): WBC, HGB, HCT, PLT, NA, K, CL, CO2, BUN, CREATININE, GLUCOSE, INR, CALCIUM in the last 72 hours.  Invalid input(s): PT, 2   Discharge Medications:   Allergies as of 10/04/2016      Reactions   Ace Inhibitors Cough   Amoxicillin Other (See Comments)   Chills and body aches Has patient had a PCN reaction causing immediate rash, facial/tongue/throat swelling, SOB or lightheadedness with hypotension: No Has patient had a PCN reaction causing severe rash involving mucus membranes or skin necrosis:  No Has patient had a PCN reaction that required hospitalization: No Has patient had a PCN reaction occurring within the last 10 years: No If all of the above answers are "NO", then may proceed with Cephalosporin use.   Sulfa Antibiotics Swelling, Other (See Comments)   Had eye swelling and pain after using eye drops with sulfa component      Medication List    STOP taking these medications   vitamin E 400 UNIT capsule     TAKE these medications   aspirin EC 325 MG tablet Take 1 tablet (325 mg total) by  mouth daily. What changed:  medication strength  how much to take   calcium carbonate 1250 (500 Ca) MG tablet Commonly known as:  OS-CAL - dosed in mg of elemental calcium Take 1 tablet by mouth daily.   chlorpheniramine 4 MG tablet Commonly known as:  CHLOR-TRIMETON Take 4 mg by mouth daily.   cholecalciferol 1000 units tablet Commonly known as:  VITAMIN D Take 1,000 Units by mouth daily.   cycloSPORINE 0.05 % ophthalmic emulsion Commonly known as:  RESTASIS Place 1 drop into both eyes 2 (two) times daily as needed (dry eyes).   fluticasone 50 MCG/ACT nasal spray Commonly known as:  FLONASE Place 1 spray into both nostrils daily as needed for allergies. For nasal congestion   losartan-hydrochlorothiazide 100-12.5 MG tablet Commonly known as:  HYZAAR Take 1 tablet by mouth daily.   methocarbamol 500 MG tablet Commonly known as:  ROBAXIN Take 1 tablet (500 mg total) by mouth every 6 (six) hours as needed for muscle spasms.   omeprazole 40 MG capsule Commonly known as:  PRILOSEC Take 40 mg by mouth daily.   oxyCODONE-acetaminophen 5-325 MG tablet Commonly known as:  ROXICET Take 1 tablet by mouth every 6 (six) hours as needed for severe pain.   potassium chloride SA 20 MEQ tablet Commonly known as:  K-DUR,KLOR-CON Take 20 mEq by mouth daily.   PROAIR HFA 108 (90 Base) MCG/ACT inhaler Generic drug:  albuterol Inhale 2 puffs into the lungs every 4 (four) hours as needed for wheezing or shortness of breath.       Diagnostic Studies: Dg Knee 1-2 Views Left  Result Date: 10/01/2016 CLINICAL DATA:  Status post left knee replacement EXAM: LEFT KNEE - 1-2 VIEW COMPARISON:  None. FINDINGS: Left knee replacement is noted. No acute bony or soft tissue abnormality is seen. IMPRESSION: Status post left knee replacement. Electronically Signed   By: Inez Catalina M.D.   On: 10/01/2016 15:36   Xr Knee 1-2 Views Left  Result Date: 10/15/2016 Ending AP both the lateral left  knee shows total knee arthroplasty prosthesis in good position no postop, occasions. Good cement mantle. Impression: Postop total knee arthroplasty. Good position and alignment.     Follow-up Information    [facility] Follow up.   Why:  Someone from [place] will contact you to arrange start date and time for therapy. Contact information: [address] [place] [address] [phone]        Marybelle Killings, MD. Schedule an appointment as soon as possible for a visit today.   Specialty:  Orthopedic Surgery Why:  need return office visit 2 weeks postop  Contact information: [place] [address] [phone]           Discharge Plan:  discharge to home Disposition:     Signed: Benjiman Core for mark yates MD [facility] orthopedics  10/23/2016, 9:54 AM

## 2016-10-29 DIAGNOSIS — G4733 Obstructive sleep apnea (adult) (pediatric): Secondary | ICD-10-CM | POA: Diagnosis not present

## 2016-11-03 ENCOUNTER — Ambulatory Visit: Payer: PPO | Admitting: Physical Therapy

## 2016-11-03 DIAGNOSIS — M25662 Stiffness of left knee, not elsewhere classified: Secondary | ICD-10-CM

## 2016-11-03 DIAGNOSIS — M6281 Muscle weakness (generalized): Secondary | ICD-10-CM

## 2016-11-03 NOTE — Therapy (Signed)
[place], [place], [address] Phone: [phone]   Fax:  [phone]  Physical Therapy Treatment  Patient Details  Name: Jessica Watts MRN: [id number] Date of Birth: 12/15/1945 Referring Provider: Rodell Perna, MD  Encounter Date: 11/03/2016      PT End of Session - 11/03/16 1020    Visit Number 2   Number of Visits 9   Date for PT Re-Evaluation 11/21/16   Authorization Type MCR KX at visit 15   PT Start Time 1015   PT Stop Time 1106   PT Time Calculation (min) 51 min      Past Medical History:  Diagnosis Date  . Arthritis   . Asthma   . Complication of anesthesia    has difficulties related to being Native American; multiple days in hospital after hysterectomy years ago  . COPD (chronic obstructive pulmonary disease) ([place])   . Depression    in the past  . Fibromyalgia   . GERD (gastroesophageal reflux disease)   . Headache(784.0)    migraines  . Hyperlipidemia   . Hypertension   . Lupus   . Macular degeneration    left eye  . Migraine   . Neuropathy ([place])   . OSA (obstructive sleep apnea) 04/22/2016   Moderate with AHI 16/hr on CPAP  . Peptic ulcer disease   . Restless legs   . Shortness of breath dyspnea   . Sleep apnea    last sleep study 2000, not using CPAP  . SVT (supraventricular tachycardia) ([place]) 12/03/2015   ablation for SVT    Past Surgical History:  Procedure Laterality Date  . ABDOMINAL HYSTERECTOMY    . ABLATION OF DYSRHYTHMIC FOCUS  12/03/2015   SVT  . ANTERIOR CERVICAL DECOMP/DISCECTOMY FUSION  07/20/2012   Procedure: ANTERIOR CERVICAL DECOMPRESSION/DISCECTOMY FUSION 2 LEVELS;  Surgeon: Charlie Pitter, MD;  Location: [facility];  Service: Neurosurgery;  Laterality: N/A;  cervical three- four , four -  five  . APPENDECTOMY    . BACK SURGERY     lumbar fusion  . CARDIAC CATHETERIZATION N/A 09/14/2015   Procedure: Left Heart Cath and Coronary Angiography;  Surgeon:  Sherren Mocha, MD;  Location: [facility];  Service: Cardiovascular;  Laterality: N/A;  . CARPAL TUNNEL RELEASE     bilat  . CERVICAL FUSION     C2/3  . ELECTROPHYSIOLOGIC STUDY N/A 12/03/2015   Procedure: SVT Ablation;  Surgeon: Evans Lance, MD;  Location: [facility];  Service: Cardiovascular;  Laterality: N/A;  . EYE SURGERY Bilateral    cataract surgery with lens implant  . KNEE ARTHROSCOPY Left   . NEUROMA SURGERY     L foot  . SHOULDER SURGERY    . TONSILLECTOMY    . TOTAL KNEE ARTHROPLASTY Left 10/01/2016   Procedure: LEFT TOTAL KNEE ARTHROPLASTY;  Surgeon: Marybelle Killings, MD;  Location: [facility];  Service: Orthopedics;  Laterality: Left;  . ULNAR NERVE TRANSPOSITION  07/20/2012   Procedure: ULNAR NERVE DECOMPRESSION/TRANSPOSITION;  Surgeon: Charlie Pitter, MD;  Location: [facility];  Service: Neurosurgery;  Laterality: Left;    There were no vitals filed for this visit.      Subjective Assessment - 11/03/16 1017    Subjective No pain now. Up to 5/10 with sleep positions.    Currently in Pain? No/denies   Pain Score 0-No pain   Pain Location Knee   Pain Orientation Left   Pain  Descriptors / Indicators Dull   Pain Type Surgical pain   Pain Frequency Intermittent   Aggravating Factors  trying to position for sleep    Pain Relieving Factors keep moving, HEP                         [facility] Adult PT Treatment/Exercise - 11/03/16 0001      Ambulation/Gait   Stair Management Technique One rail Left;Alternating pattern   Number of Stairs 8   Gait Comments able to ascend and descend stairs safely alternating pattern with one hand rail      Knee/Hip Exercises: Stretches   Passive Hamstring Stretch Limitations seated heel prop     Knee/Hip Exercises: Aerobic   Recumbent Bike L1 x 5 minutes     Knee/Hip Exercises: Standing   Forward Step Up 10 reps   Forward Step Up Limitations HEP      Knee/Hip Exercises: Seated   Long Arc Quad Limitations  red band x 15 HEP    Hamstring Limitations red band x 15 HEP   Sit to [employer] 10 reps     Knee/Hip Exercises: Supine   Quad Sets 10 reps   Heel Prop for Knee Extension 1 minute   Heel Prop for Knee Extension Weight (lbs) while perfroming quad sets    Straight Leg Raises 10 reps   Straight Leg Raises Limitations with initial quad set                 PT Education - 11/03/16 1106    Education provided Yes   Education Details HEP   Person(s) Educated Patient   Methods Explanation;Handout   Comprehension Verbalized understanding          PT Short Term Goals - 10/23/16 1605      PT SHORT TERM GOAL #1   Title FOTO to 59% ability to indicate significant improvement in functional ability by 2/9   Baseline 51% ability at eval   Time 4   Period Weeks   Status New     PT SHORT TERM GOAL #2   Title Pt will demo good eccentric control to descend stairs, able with only use of one upper extremity for balance for safe navigation of stairs at home   Baseline use of bilat upper extremities and poor control   Time 4   Period Weeks   Status New     PT SHORT TERM GOAL #3   Title 0-120 deg knee ROM for proper functional range   Baseline see flowsheet   Time 4   Period Weeks   Status New     PT SHORT TERM GOAL #4   Title hip MMT all to at least 4+/5 to indicate proximal stability for distal control.    Baseline see flowsheet   Time 4   Period Weeks   Status New                  Plan - 11/03/16 1121    Clinical Impression Statement Pt presents with mild quad lag and hamstring tightness. She is performing heel prop for extension at home. Progressed HEP to include seated knee theraband exercises and step ups. Reinforced quad sets and hamstring stretch during heel prop. Ice post session for soreness.    PT Next Visit Plan ROM, eccentric control, hip strengthening, review HEP, manual for hamstring tenderness?   PT Home Exercise Plan home health exercises, heel prop,  heel-toe gait, knee theraband seated ,  step ups    Consulted and Agree with Plan of Care Patient      Patient will benefit from skilled therapeutic intervention in order to improve the following deficits and impairments:  Decreased range of motion, Decreased activity tolerance, Impaired flexibility, Increased edema, Decreased strength  Visit Diagnosis: Stiffness of left knee, not elsewhere classified  Muscle weakness (generalized)     Problem List Patient Active Problem List   Diagnosis Date Noted  . Arthritis of left knee 10/01/2016  . OSA (obstructive sleep apnea) 04/22/2016  . SVT (supraventricular tachycardia) ([place]) 11/08/2015  . SOB (shortness of breath) 08/23/2015  . Allergic rhinitis 08/22/2015  . Absolute anemia 08/22/2015  . Essential (primary) hypertension 08/22/2015  . Gastro-esophageal reflux disease without esophagitis 08/22/2015  . Hereditary and idiopathic neuropathy 08/22/2015  . Migraine with typical aura 08/22/2015  . Unilateral primary osteoarthritis, left knee 08/22/2015  . Borderline diabetes mellitus 08/22/2015  . Pure hypercholesterolemia 08/22/2015  . Systemic sclerosis ([place]) 08/22/2015  . Fast heart beat 08/22/2015  . Absence of bladder continence 08/22/2015  . Apnea, sleep 07/16/2015  . Cataract 09/26/2013  . Cataract of left eye 08/29/2013  . Cervical radiculopathy 07/20/2012  . Ulnar neuropathy at elbow 07/20/2012    Dorene Ar, PTA 11/03/2016, 11:48 AM  [facility] [address] [place], [place], [address] Phone: [phone]   Fax:  [phone]  Name: AANIAH BATTLEY MRN: [id number] Date of Birth: 03-11-1946

## 2016-11-03 NOTE — Patient Instructions (Addendum)
  Forward  USE HAND RAIL  Facing step, place one leg on step, flexed at hip. Step up slowly, bringing hips in line with knee and shoulder. Bring other foot onto step. Reverse process to step back down. Repeat with other leg. Do __10__ repetitions, __2__ sets.  Knee Extension: Resisted (Sitting)   With band looped around right ankle and under other foot, straighten leg with ankle loop. Keep other leg bent to increase resistance. Repeat __10__ times per set. Do __1-2__ sets per session. Do ___2_ sessions per day.  http://orth.exer.us/690   CKnee Flexion: Resisted (Sitting)  CAN PLACE KNOT OF BAND IN DOORWAY OR: Sit with band under left foot and looped around ankle of supported leg. Pull unsupported leg back. Repeat ___10_ times per set. Do __1-2__ sets per session. Do ___2_ sessions per day.

## 2016-11-07 ENCOUNTER — Ambulatory Visit: Payer: PPO | Admitting: Physical Therapy

## 2016-11-07 DIAGNOSIS — M25662 Stiffness of left knee, not elsewhere classified: Secondary | ICD-10-CM | POA: Diagnosis not present

## 2016-11-07 DIAGNOSIS — M6281 Muscle weakness (generalized): Secondary | ICD-10-CM

## 2016-11-07 NOTE — Therapy (Signed)
[place], [place], [address] Phone: [phone]   Fax:  [phone]  Physical Therapy Treatment  Patient Details  Name: Jessica Watts MRN: [id number] Date of Birth: January 13, 1946 Referring Provider: Rodell Perna, MD  Encounter Date: 11/07/2016      PT End of Session - 11/07/16 1145    Visit Number 3   Number of Visits 9   Date for PT Re-Evaluation 11/21/16   Authorization Type MCR KX at visit 15   PT Start Time 1100   PT Stop Time 1148   PT Time Calculation (min) 48 min      Past Medical History:  Diagnosis Date  . Arthritis   . Asthma   . Complication of anesthesia    has difficulties related to being Native American; multiple days in hospital after hysterectomy years ago  . COPD (chronic obstructive pulmonary disease) ([place])   . Depression    in the past  . Fibromyalgia   . GERD (gastroesophageal reflux disease)   . Headache(784.0)    migraines  . Hyperlipidemia   . Hypertension   . Lupus   . Macular degeneration    left eye  . Migraine   . Neuropathy ([place])   . OSA (obstructive sleep apnea) 04/22/2016   Moderate with AHI 16/hr on CPAP  . Peptic ulcer disease   . Restless legs   . Shortness of breath dyspnea   . Sleep apnea    last sleep study 2000, not using CPAP  . SVT (supraventricular tachycardia) ([place]) 12/03/2015   ablation for SVT    Past Surgical History:  Procedure Laterality Date  . ABDOMINAL HYSTERECTOMY    . ABLATION OF DYSRHYTHMIC FOCUS  12/03/2015   SVT  . ANTERIOR CERVICAL DECOMP/DISCECTOMY FUSION  07/20/2012   Procedure: ANTERIOR CERVICAL DECOMPRESSION/DISCECTOMY FUSION 2 LEVELS;  Surgeon: Charlie Pitter, MD;  Location: [facility];  Service: Neurosurgery;  Laterality: N/A;  cervical three- four , four -  five  . APPENDECTOMY    . BACK SURGERY     lumbar fusion  . CARDIAC CATHETERIZATION N/A 09/14/2015   Procedure: Left Heart Cath and Coronary Angiography;  Surgeon:  Sherren Mocha, MD;  Location: [facility];  Service: Cardiovascular;  Laterality: N/A;  . CARPAL TUNNEL RELEASE     bilat  . CERVICAL FUSION     C2/3  . ELECTROPHYSIOLOGIC STUDY N/A 12/03/2015   Procedure: SVT Ablation;  Surgeon: Evans Lance, MD;  Location: [facility];  Service: Cardiovascular;  Laterality: N/A;  . EYE SURGERY Bilateral    cataract surgery with lens implant  . KNEE ARTHROSCOPY Left   . NEUROMA SURGERY     L foot  . SHOULDER SURGERY    . TONSILLECTOMY    . TOTAL KNEE ARTHROPLASTY Left 10/01/2016   Procedure: LEFT TOTAL KNEE ARTHROPLASTY;  Surgeon: Marybelle Killings, MD;  Location: [facility];  Service: Orthopedics;  Laterality: Left;  . ULNAR NERVE TRANSPOSITION  07/20/2012   Procedure: ULNAR NERVE DECOMPRESSION/TRANSPOSITION;  Surgeon: Charlie Pitter, MD;  Location: [facility];  Service: Neurosurgery;  Laterality: Left;    There were no vitals filed for this visit.                       [facility] Adult PT Treatment/Exercise - 11/07/16 0001      Knee/Hip Exercises: Stretches   Passive Hamstring Stretch Limitations seated heel prop  Knee/Hip Exercises: Aerobic   Recumbent Bike L2 x 5 minutes      Knee/Hip Exercises: Standing   Lateral Step Up 10 reps;Hand Hold: 1;Step Height: 4"   Forward Step Up 20 reps   Step Down 10 reps;Hand Hold: 1;Step Height: 4"   Step Down Limitations retro step up    Wall Squat 10 reps     Knee/Hip Exercises: Seated   Long Arc Quad Limitations 20 with red band    Hamstring Limitations red band x 20     Knee/Hip Exercises: Supine   Quad Sets 10 reps   Quad Sets Limitations seated    Heel Prop for Knee Extension 1 minute   Heel Prop for Knee Extension Weight (lbs) while perfroming quad sets      Modalities   Modalities Moist Heat     Moist Heat Therapy   Number Minutes Moist Heat 10 Minutes   Moist Heat Location Knee  medial      Manual Therapy   Manual Therapy Soft tissue mobilization   Soft  tissue mobilization medial anterior and posterior knee, IASTM, tissue softened                   PT Short Term Goals - 11/07/16 1145      PT SHORT TERM GOAL #1   Title FOTO to 59% ability to indicate significant improvement in functional ability by 2/9   Baseline 51% ability at eval   Time 4   Period Weeks   Status On-going     PT SHORT TERM GOAL #2   Title Pt will demo good eccentric control to descend stairs, able with only use of one upper extremity for balance for safe navigation of stairs at home   Baseline improving in clinic    Time 4   Period Weeks   Status On-going     PT SHORT TERM GOAL #3   Title 0-120 deg knee ROM for proper functional range   Time 4   Period Weeks   Status Unable to assess     PT SHORT TERM GOAL #4   Title hip MMT all to at least 4+/5 to indicate proximal stability for distal control.    Time 4   Period Weeks   Status Unable to assess                  Plan - 11/07/16 1147    Clinical Impression Statement Added more closed chain today with good tolerance. Manual for medial anterior and posterior knee tenderness. Pt reports less tenderness after manual. Trial of HMP to medial knee tor tissue lengthening and softening. Progressing toward goals.   PT Next Visit Plan ROM, eccentric control, hip strengthening, review HEP, assess manual for hamstring tenderness   PT Home Exercise Plan home health exercises, heel prop, heel-toe gait, knee theraband seated , step ups    Consulted and Agree with Plan of Care Patient      Patient will benefit from skilled therapeutic intervention in order to improve the following deficits and impairments:  Decreased range of motion, Decreased activity tolerance, Impaired flexibility, Increased edema, Decreased strength  Visit Diagnosis: Stiffness of left knee, not elsewhere classified  Muscle weakness (generalized)     Problem List Patient Active Problem List   Diagnosis Date Noted  .  Arthritis of left knee 10/01/2016  . OSA (obstructive sleep apnea) 04/22/2016  . SVT (supraventricular tachycardia) ([place]) 11/08/2015  . SOB (shortness of breath) 08/23/2015  .  Allergic rhinitis 08/22/2015  . Absolute anemia 08/22/2015  . Essential (primary) hypertension 08/22/2015  . Gastro-esophageal reflux disease without esophagitis 08/22/2015  . Hereditary and idiopathic neuropathy 08/22/2015  . Migraine with typical aura 08/22/2015  . Unilateral primary osteoarthritis, left knee 08/22/2015  . Borderline diabetes mellitus 08/22/2015  . Pure hypercholesterolemia 08/22/2015  . Systemic sclerosis ([place]) 08/22/2015  . Fast heart beat 08/22/2015  . Absence of bladder continence 08/22/2015  . Apnea, sleep 07/16/2015  . Cataract 09/26/2013  . Cataract of left eye 08/29/2013  . Cervical radiculopathy 07/20/2012  . Ulnar neuropathy at elbow 07/20/2012    Dorene Ar, PTA 11/07/2016, 11:51 AM  [facility] [address] [place], [place], [address] Phone: [phone]   Fax:  [phone]  Name: Jessica Watts MRN: [id number] Date of Birth: November 09, 1945

## 2016-11-11 ENCOUNTER — Encounter: Payer: Self-pay | Admitting: Physical Therapy

## 2016-11-11 ENCOUNTER — Ambulatory Visit: Payer: PPO | Admitting: Physical Therapy

## 2016-11-11 DIAGNOSIS — M6281 Muscle weakness (generalized): Secondary | ICD-10-CM

## 2016-11-11 DIAGNOSIS — M25662 Stiffness of left knee, not elsewhere classified: Secondary | ICD-10-CM | POA: Diagnosis not present

## 2016-11-11 NOTE — Therapy (Addendum)
[place], [place], [address] Phone: [phone]   Fax:  [phone]  Physical Therapy Treatment  Patient Details  Name: Jessica Watts MRN: [id number] Date of Birth: [birth year] Referring Provider: Rodell Perna, MD  Encounter Date: 11/11/2016      PT End of Session - 11/11/16 0930    Visit Number 4   Number of Visits 9   Date for PT Re-Evaluation 11/21/16   Authorization Type MCR KX at visit 15   PT Start Time 0930   PT Stop Time 1021   PT Time Calculation (min) 51 min   Activity Tolerance Patient tolerated treatment well   Behavior During Therapy [facility] for tasks assessed/performed      Past Medical History:  Diagnosis Date  . Arthritis   . Asthma   . Complication of anesthesia    has difficulties related to being Native American; multiple days in hospital after hysterectomy years ago  . COPD (chronic obstructive pulmonary disease) ([place])   . Depression    in the past  . Fibromyalgia   . GERD (gastroesophageal reflux disease)   . Headache(784.0)    migraines  . Hyperlipidemia   . Hypertension   . Lupus   . Macular degeneration    left eye  . Migraine   . Neuropathy ([place])   . OSA (obstructive sleep apnea) 04/22/2016   Moderate with AHI 16/hr on CPAP  . Peptic ulcer disease   . Restless legs   . Shortness of breath dyspnea   . Sleep apnea    last sleep study 2000, not using CPAP  . SVT (supraventricular tachycardia) ([place]) 12/03/2015   ablation for SVT    Past Surgical History:  Procedure Laterality Date  . ABDOMINAL HYSTERECTOMY    . ABLATION OF DYSRHYTHMIC FOCUS  12/03/2015   SVT  . ANTERIOR CERVICAL DECOMP/DISCECTOMY FUSION  07/20/2012   Procedure: ANTERIOR CERVICAL DECOMPRESSION/DISCECTOMY FUSION 2 LEVELS;  Surgeon: Charlie Pitter, MD;  Location: [facility];  Service: Neurosurgery;  Laterality: N/A;  cervical three- four , four -  five  . APPENDECTOMY    . BACK SURGERY     lumbar  fusion  . CARDIAC CATHETERIZATION N/A 09/14/2015   Procedure: Left Heart Cath and Coronary Angiography;  Surgeon: Sherren Mocha, MD;  Location: [facility];  Service: Cardiovascular;  Laterality: N/A;  . CARPAL TUNNEL RELEASE     bilat  . CERVICAL FUSION     C2/3  . ELECTROPHYSIOLOGIC STUDY N/A 12/03/2015   Procedure: SVT Ablation;  Surgeon: Evans Lance, MD;  Location: [facility];  Service: Cardiovascular;  Laterality: N/A;  . EYE SURGERY Bilateral    cataract surgery with lens implant  . KNEE ARTHROSCOPY Left   . NEUROMA SURGERY     L foot  . SHOULDER SURGERY    . TONSILLECTOMY    . TOTAL KNEE ARTHROPLASTY Left 10/01/2016   Procedure: LEFT TOTAL KNEE ARTHROPLASTY;  Surgeon: Marybelle Killings, MD;  Location: [facility];  Service: Orthopedics;  Laterality: Left;  . ULNAR NERVE TRANSPOSITION  07/20/2012   Procedure: ULNAR NERVE DECOMPRESSION/TRANSPOSITION;  Surgeon: Charlie Pitter, MD;  Location: [facility];  Service: Neurosurgery;  Laterality: Left;    There were no vitals filed for this visit.      Subjective Assessment - 11/11/16 0930    Subjective Pt denies pain this morning. Cannot seem to get leg in a comfortable position for sleeping. Feels good  about navigating stairs at home.    Patient Stated Goals maneuver stairs at home, walking for exercise.    Currently in Pain? No/denies   Aggravating Factors  sleeping            [facility] PT Assessment - 11/11/16 0001      Observation/Other Assessments   Focus on Therapeutic Outcomes (FOTO)  60% ability     PROM   Left Knee Extension -8   Left Knee Flexion 125     Strength   Left Hip Flexion 4+/5  no notable quad lag   Left Hip Extension 4/5   Left Hip ABduction 5/5                     [facility] Adult PT Treatment/Exercise - 11/11/16 0001      Knee/Hip Exercises: Stretches   Active Hamstring Stretch Limitations x10 L   Passive Hamstring Stretch Limitations seated EOB   Gastroc Stretch 2 reps;30  seconds   Gastroc Stretch Limitations edge of step     Knee/Hip Exercises: Aerobic   Recumbent Bike L3 5 min     Knee/Hip Exercises: Standing   Other Standing Knee Exercises standing toe raises, back to wall     Knee/Hip Exercises: Seated   Long Arc Quad Limitations seated EOB, hold 5s with slow eccentric lower   Sit to [employer] 10 reps  ball bw knees, slow/eccentric sit     Knee/Hip Exercises: Supine   Straight Leg Raises Limitations quad set with hover over table 3s hold     Moist Heat Therapy   Number Minutes Moist Heat 10 Minutes   Moist Heat Location Knee  hamstring     Manual Therapy   Soft tissue mobilization roller and manual soft tissue to L hamstring and gastroc, soft tissue paired with passive ankle pump                PT Education - 11/11/16 0934    Education provided Yes   Education Details exercise form/rationale, HEP, importance of extension, effects on hip joint   Person(s) Educated Patient   Methods Explanation;Demonstration;Tactile cues;Verbal cues   Comprehension Verbalized understanding;Returned demonstration;Verbal cues required;Tactile cues required;Need further instruction          PT Short Term Goals - 11/11/16 1020      PT SHORT TERM GOAL #1   Title FOTO to 59% ability to indicate significant improvement in functional ability by 2/9   Baseline 60% ability on 1/30   Status Achieved     PT SHORT TERM GOAL #2   Title Pt will demo good eccentric control to descend stairs, able with only use of one upper extremity for balance for safe navigation of stairs at home   Baseline fatigue with eccentric control, cont to use bilateral UE    Status On-going     PT SHORT TERM GOAL #3   Title 0-120 deg knee ROM for proper functional range   Baseline see flowsheet   Status On-going     PT SHORT TERM GOAL #4   Title hip MMT all to at least 4+/5 to indicate proximal stability for distal control.    Baseline see flowsheet   Status On-going                   Plan - 11/11/16 1015    Clinical Impression Statement Pt demo significant improvement in strength around hip and knee but cont to be limited in knee extension due to  Hamstring and gastroc/soleus tightness. Gained 3 deg ROM extension following just soft tissue mobilization at the beginning of treatment. The lack of extension is causing undue stress and discomfort at hip joint. Also notbale is the formation of a Baker's cyst in the L knee. Pt will cont to benefit from skilled PT in order to imprvoe extension ROM to decrease pain and improve functional mobility.    PT Treatment/Interventions ADLs/Self Care Home Management;Cryotherapy;Electrical Stimulation;Functional mobility training;Stair training;Gait training;Moist Heat;Therapeutic activities;Therapeutic exercise;Balance training;Neuromuscular re-education;Patient/family education;Passive range of motion;Scar mobilization;Manual techniques;Taping;Vasopneumatic Device   PT Next Visit Plan extension ROM, quad control in new ranges   PT Home Exercise Plan home health exercises, heel prop, heel-toe gait, knee theraband seated , step ups , gastroc stretch edge of step, seated hamstring stretch, toe raises at wall.    Consulted and Agree with Plan of Care Patient      Patient will benefit from skilled therapeutic intervention in order to improve the following deficits and impairments:  Decreased range of motion, Decreased activity tolerance, Impaired flexibility, Increased edema, Decreased strength  Visit Diagnosis: Stiffness of left knee, not elsewhere classified  Muscle weakness (generalized)       G-Codes - Nov 15, 2016 1019    Functional Assessment Tool Used FOTO 60% ability, knee extension, clinical judgement   Functional Limitation Mobility: Walking and moving around;Other PT primary   Mobility: Walking and Moving Around Goal Status [phone]) At least 20 percent but less than 40 percent impaired, limited or restricted    Mobility: Walking and Moving Around Discharge Status [phone]) At least 20 percent but less than 40 percent impaired, limited or restricted              Problem List Patient Active Problem List   Diagnosis Date Noted  . Arthritis of left knee 10/01/2016  . OSA (obstructive sleep apnea) 04/22/2016  . SVT (supraventricular tachycardia) ([place]) 11/08/2015  . SOB (shortness of breath) 08/23/2015  . Allergic rhinitis 08/22/2015  . Absolute anemia 08/22/2015  . Essential (primary) hypertension 08/22/2015  . Gastro-esophageal reflux disease without esophagitis 08/22/2015  . Hereditary and idiopathic neuropathy 08/22/2015  . Migraine with typical aura 08/22/2015  . Unilateral primary osteoarthritis, left knee 08/22/2015  . Borderline diabetes mellitus 08/22/2015  . Pure hypercholesterolemia 08/22/2015  . Systemic sclerosis ([place]) 08/22/2015  . Fast heart beat 08/22/2015  . Absence of bladder continence 08/22/2015  . Apnea, sleep 07/16/2015  . Cataract 09/26/2013  . Cataract of left eye 08/29/2013  . Cervical radiculopathy 07/20/2012  . Ulnar neuropathy at elbow 07/20/2012    Lenton Gendreau C. Murle Otting PT, DPT 11-15-16 10:24 AM   [facility] [address] [place], [place], [address] Phone: [phone]   Fax:  [phone]  Name: Jessica Watts MRN: [id number] Date of Birth: 01-Apr-1946

## 2016-11-12 ENCOUNTER — Encounter (INDEPENDENT_AMBULATORY_CARE_PROVIDER_SITE_OTHER): Payer: Self-pay | Admitting: Orthopaedic Surgery

## 2016-11-12 ENCOUNTER — Ambulatory Visit (INDEPENDENT_AMBULATORY_CARE_PROVIDER_SITE_OTHER): Payer: PPO | Admitting: Orthopaedic Surgery

## 2016-11-12 VITALS — Ht 60.0 in | Wt 209.0 lb

## 2016-11-12 DIAGNOSIS — Z96652 Presence of left artificial knee joint: Secondary | ICD-10-CM

## 2016-11-12 NOTE — Progress Notes (Signed)
Post-Op Visit Note   Patient: Jessica Watts           Date of Birth: 06/10/1946           MRN: [id number] Visit Date: 11/12/2016 PCP: Osborne Casco, MD   Assessment & Plan:  Chief Complaint:  Chief Complaint  Patient presents with  . Left Knee - Routine Post Op   Visit Diagnoses:  1. S/P total knee arthroplasty, left     Plan: Continue therapy. She is a mature with the cane. Incision is well-healed. X-ray showed good position alignment. Recheck 4 weeks.  Follow-Up Instructions: Return in about 4 weeks (around 12/10/2016).   Orders:  No orders of the defined types were placed in this encounter.  No orders of the defined types were placed in this encounter.  HPI Patient returns for 4 week follow up. She is status post left total knee arthroplasty on 10/01/2016.  She is 6 weeks post op. She states she is doing much better. Physical therapy is going well. She denies any pain.   Imaging: No results found.  PMFS History: Patient Active Problem List   Diagnosis Date Noted  . Arthritis of left knee 10/01/2016  . OSA (obstructive sleep apnea) 04/22/2016  . SVT (supraventricular tachycardia) ([place]) 11/08/2015  . SOB (shortness of breath) 08/23/2015  . Allergic rhinitis 08/22/2015  . Absolute anemia 08/22/2015  . Essential (primary) hypertension 08/22/2015  . Gastro-esophageal reflux disease without esophagitis 08/22/2015  . Hereditary and idiopathic neuropathy 08/22/2015  . Migraine with typical aura 08/22/2015  . Unilateral primary osteoarthritis, left knee 08/22/2015  . Borderline diabetes mellitus 08/22/2015  . Pure hypercholesterolemia 08/22/2015  . Systemic sclerosis ([place]) 08/22/2015  . Fast heart beat 08/22/2015  . Absence of bladder continence 08/22/2015  . Apnea, sleep 07/16/2015  . Cataract 09/26/2013  . Cataract of left eye 08/29/2013  . Cervical radiculopathy 07/20/2012  . Ulnar neuropathy at elbow 07/20/2012   Past Medical History:    Diagnosis Date  . Arthritis   . Asthma   . Complication of anesthesia    has difficulties related to being Native American; multiple days in hospital after hysterectomy years ago  . COPD (chronic obstructive pulmonary disease) ([place])   . Depression    in the past  . Fibromyalgia   . GERD (gastroesophageal reflux disease)   . Headache(784.0)    migraines  . Hyperlipidemia   . Hypertension   . Lupus   . Macular degeneration    left eye  . Migraine   . Neuropathy ([place])   . OSA (obstructive sleep apnea) 04/22/2016   Moderate with AHI 16/hr on CPAP  . Peptic ulcer disease   . Restless legs   . Shortness of breath dyspnea   . Sleep apnea    last sleep study 2000, not using CPAP  . SVT (supraventricular tachycardia) ([place]) 12/03/2015   ablation for SVT    Family History  Problem Relation Age of Onset  . Heart disease Mother   . Heart attack Mother   . Hypertension Mother   . Heart attack Father   . Heart disease Father   . Hypertension Father     Past Surgical History:  Procedure Laterality Date  . ABDOMINAL HYSTERECTOMY    . ABLATION OF DYSRHYTHMIC FOCUS  12/03/2015   SVT  . ANTERIOR CERVICAL DECOMP/DISCECTOMY FUSION  07/20/2012   Procedure: ANTERIOR CERVICAL DECOMPRESSION/DISCECTOMY FUSION 2 LEVELS;  Surgeon: Charlie Pitter, MD;  Location: [place] [facility];  Service: Neurosurgery;  Laterality: N/A;  cervical three- four , four -  five  . APPENDECTOMY    . BACK SURGERY     lumbar fusion  . CARDIAC CATHETERIZATION N/A 09/14/2015   Procedure: Left Heart Cath and Coronary Angiography;  Surgeon: Sherren Mocha, MD;  Location: [place] [facility];  Service: Cardiovascular;  Laterality: N/A;  . CARPAL TUNNEL RELEASE     bilat  . CERVICAL FUSION     C2/3  . ELECTROPHYSIOLOGIC STUDY N/A 12/03/2015   Procedure: SVT Ablation;  Surgeon: Evans Lance, MD;  Location: [place] [facility] LAB;  Service: Cardiovascular;  Laterality: N/A;  . EYE SURGERY Bilateral    cataract surgery with lens  implant  . KNEE ARTHROSCOPY Left   . NEUROMA SURGERY     L foot  . SHOULDER SURGERY    . TONSILLECTOMY    . TOTAL KNEE ARTHROPLASTY Left 10/01/2016   Procedure: LEFT TOTAL KNEE ARTHROPLASTY;  Surgeon: Marybelle Killings, MD;  Location: [facility];  Service: Orthopedics;  Laterality: Left;  . ULNAR NERVE TRANSPOSITION  07/20/2012   Procedure: ULNAR NERVE DECOMPRESSION/TRANSPOSITION;  Surgeon: Charlie Pitter, MD;  Location: [place] NEURO ORS;  Service: Neurosurgery;  Laterality: Left;   Social History   Occupational History  . Not on file.   Social History Main Topics  . Smoking status: Never Smoker  . Smokeless tobacco: Never Used  . Alcohol use Yes     Comment: occasional wine  . Drug use: No  . Sexual activity: Not on file

## 2016-11-13 ENCOUNTER — Ambulatory Visit: Payer: PPO | Attending: Orthopaedic Surgery | Admitting: Physical Therapy

## 2016-11-13 ENCOUNTER — Encounter: Payer: Self-pay | Admitting: Physical Therapy

## 2016-11-13 DIAGNOSIS — M6281 Muscle weakness (generalized): Secondary | ICD-10-CM | POA: Insufficient documentation

## 2016-11-13 DIAGNOSIS — M25662 Stiffness of left knee, not elsewhere classified: Secondary | ICD-10-CM | POA: Diagnosis not present

## 2016-11-13 NOTE — Therapy (Addendum)
[place], [place], [address] Phone: [phone]   Fax:  [phone]  Physical Therapy Treatment  Patient Details  Name: Jessica Watts MRN: [id number] Date of Birth: 02/05/1946 Referring Provider: Rodell Perna, MD  Encounter Date: 11/13/2016      PT End of Session - 11/13/16 1017    Visit Number 5   Number of Visits 9   Date for PT Re-Evaluation 11/21/16   Authorization Type MCR KX at visit 15   PT Start Time 1017   PT Stop Time 1108   PT Time Calculation (min) 51 min   Activity Tolerance Patient tolerated treatment well   Behavior During Therapy [facility] for tasks assessed/performed      Past Medical History:  Diagnosis Date  . Arthritis   . Asthma   . Complication of anesthesia    has difficulties related to being Native American; multiple days in hospital after hysterectomy years ago  . COPD (chronic obstructive pulmonary disease) ([place])   . Depression    in the past  . Fibromyalgia   . GERD (gastroesophageal reflux disease)   . Headache(784.0)    migraines  . Hyperlipidemia   . Hypertension   . Lupus   . Macular degeneration    left eye  . Migraine   . Neuropathy ([place])   . OSA (obstructive sleep apnea) 04/22/2016   Moderate with AHI 16/hr on CPAP  . Peptic ulcer disease   . Restless legs   . Shortness of breath dyspnea   . Sleep apnea    last sleep study 2000, not using CPAP  . SVT (supraventricular tachycardia) ([place]) 12/03/2015   ablation for SVT    Past Surgical History:  Procedure Laterality Date  . ABDOMINAL HYSTERECTOMY    . ABLATION OF DYSRHYTHMIC FOCUS  12/03/2015   SVT  . ANTERIOR CERVICAL DECOMP/DISCECTOMY FUSION  07/20/2012   Procedure: ANTERIOR CERVICAL DECOMPRESSION/DISCECTOMY FUSION 2 LEVELS;  Surgeon: Charlie Pitter, MD;  Location: [place] NEUR[facility] ORS;  Service: Neurosurgery;  Laterality: N/A;  cervical three- four , four -  five  . APPENDECTOMY    . BACK SURGERY     lumbar  fusion  . CARDIAC CATHETERIZATION N/A 09/14/2015   Procedure: Left Heart Cath and Coronary Angiography;  Surgeon: Sherren Mocha, MD;  Location: [place] CV LAB;  Service: Cardiovascular;  Laterality: N/A;  . CARPAL TUNNEL RELEASE     bilat  . CERVICAL FUSION     C2/3  . ELECTROPHYSIOLOGIC STUDY N/A 12/03/2015   Procedure: SVT Ablation;  Surgeon: Evans Lance, MD;  Location: [place] CV LAB;  Service: Cardiovascular;  Laterality: N/A;  . EYE SURGERY Bilateral    cataract surgery with lens implant  . KNEE ARTHROSCOPY Left   . NEUROMA SURGERY     L foot  . SHOULDER SURGERY    . TONSILLECTOMY    . TOTAL KNEE ARTHROPLASTY Left 10/01/2016   Procedure: LEFT TOTAL KNEE ARTHROPLASTY;  Surgeon: Marybelle Killings, MD;  Location: [facility];  Service: Orthopedics;  Laterality: Left;  . ULNAR NERVE TRANSPOSITION  07/20/2012   Procedure: ULNAR NERVE DECOMPRESSION/TRANSPOSITION;  Surgeon: Charlie Pitter, MD;  Location: [place] NEURO ORS;  Service: Neurosurgery;  Laterality: Left;    There were no vitals filed for this visit.      Subjective Assessment - 11/13/16 1017    Subjective Denies pain today, was sore after last visit. Cont to experience significant discomfort in L hip that will  wake her if she rolls onto it at night.    Patient Stated Goals maneuver stairs at home, walking for exercise.    Currently in Pain? No/denies                         [facility] Adult PT Treatment/Exercise - 11/13/16 0001      Knee/Hip Exercises: Stretches   Passive Hamstring Stretch Limitations supine with green strap   Gastroc Stretch 2 reps;30 seconds   Gastroc Stretch Limitations slant board   Other Knee/Hip Stretches prone hang 1 min 3#     Knee/Hip Exercises: Aerobic   Recumbent Bike L4 5 min, cues to press through heels.      Knee/Hip Exercises: Standing   Heel Raises Limitations in parallel bars, slow eccentric lower   Lateral Step Up Limitations up/down airex   Forward Step Up Limitations  up/back from airex   SLS solid surface   Other Standing Knee Exercises heel walking, retro stepping in parallel bars   Other Standing Knee Exercises slow marches on airex     Modalities   Modalities Moist Heat     Moist Heat Therapy   Number Minutes Moist Heat 10 Minutes   Moist Heat Location Hip  L hip/ITband     Manual Therapy   Manual therapy comments edu in use of tennis ball   Soft tissue mobilization roller & trigger point release L hip, IT band                PT Education - 11/13/16 1020    Education provided Yes   Education Details exercise form/rationale   Person(s) Educated Patient   Methods Explanation;Demonstration;Verbal cues;Tactile cues   Comprehension Verbalized understanding;Returned demonstration;Verbal cues required;Tactile cues required;Need further instruction          PT Short Term Goals - 11/11/16 1020      PT SHORT TERM GOAL #1   Title FOTO to 59% ability to indicate significant improvement in functional ability by 2/9   Baseline 60% ability on 1/30   Status Achieved     PT SHORT TERM GOAL #2   Title Pt will demo good eccentric control to descend stairs, able with only use of one upper extremity for balance for safe navigation of stairs at home   Baseline fatigue with eccentric control, cont to use bilateral UE    Status On-going     PT SHORT TERM GOAL #3   Title 0-120 deg knee ROM for proper functional range   Baseline see flowsheet   Status On-going     PT SHORT TERM GOAL #4   Title hip MMT all to at least 4+/5 to indicate proximal stability for distal control.    Baseline see flowsheet   Status On-going                  Plan - 11/13/16 1059    Clinical Impression Statement Pt did well with standing exercises today, reported some stiffness and fatigue as expected. trigger points around L hip and tightness in ITB recreated concordant pain and were addressed in manual treatment.    PT Next Visit Plan CKC strength,  extension ROM, hip flexibility   PT Home Exercise Plan home health exercises, heel prop, heel-toe gait, knee theraband seated , step ups , gastroc stretch edge of step, seated hamstring stretch, toe raises at wall.    Consulted and Agree with Plan of Care Patient  PT GCODES Current: CJ Goal: CI  Patient will benefit from skilled therapeutic intervention in order to improve the following deficits and impairments:     Visit Diagnosis: Stiffness of left knee, not elsewhere classified  Muscle weakness (generalized)     Problem List Patient Active Problem List   Diagnosis Date Noted  . Arthritis of left knee 10/01/2016  . OSA (obstructive sleep apnea) 04/22/2016  . SVT (supraventricular tachycardia) ([place]) 11/08/2015  . SOB (shortness of breath) 08/23/2015  . Allergic rhinitis 08/22/2015  . Absolute anemia 08/22/2015  . Essential (primary) hypertension 08/22/2015  . Gastro-esophageal reflux disease without esophagitis 08/22/2015  . Hereditary and idiopathic neuropathy 08/22/2015  . Migraine with typical aura 08/22/2015  . Unilateral primary osteoarthritis, left knee 08/22/2015  . Borderline diabetes mellitus 08/22/2015  . Pure hypercholesterolemia 08/22/2015  . Systemic sclerosis ([place]) 08/22/2015  . Fast heart beat 08/22/2015  . Absence of bladder continence 08/22/2015  . Apnea, sleep 07/16/2015  . Cataract 09/26/2013  . Cataract of left eye 08/29/2013  . Cervical radiculopathy 07/20/2012  . Ulnar neuropathy at elbow 07/20/2012    Saidy Ormand C. Darlena Koval PT, DPT 11/13/16 1:50 PM   [facility] [address] [place], [place], [address] Phone: [phone]   Fax:  [phone]  Name: Jessica Watts MRN: [id number] Date of Birth: 1946/06/18

## 2016-11-18 ENCOUNTER — Encounter: Payer: Self-pay | Admitting: Physical Therapy

## 2016-11-18 ENCOUNTER — Ambulatory Visit: Payer: PPO | Admitting: Physical Therapy

## 2016-11-18 DIAGNOSIS — M25662 Stiffness of left knee, not elsewhere classified: Secondary | ICD-10-CM | POA: Diagnosis not present

## 2016-11-18 DIAGNOSIS — M6281 Muscle weakness (generalized): Secondary | ICD-10-CM

## 2016-11-18 NOTE — Therapy (Signed)
[place], [place], [address] Phone: [phone]   Fax:  [phone]  Physical Therapy Treatment  Patient Details  Name: Jessica Watts MRN: [id number] Date of Birth: July 09, 1946 Referring Provider: Rodell Perna, MD  Encounter Date: 11/18/2016      PT End of Session - 11/18/16 0930    Visit Number 6   Number of Visits 9   Date for PT Re-Evaluation 11/21/16   Authorization Type MCR KX at visit 15   PT Start Time 0930   PT Stop Time 1024   PT Time Calculation (min) 54 min   Activity Tolerance Patient tolerated treatment well   Behavior During Therapy [facility] for tasks assessed/performed      Past Medical History:  Diagnosis Date  . Arthritis   . Asthma   . Complication of anesthesia    has difficulties related to being Native American; multiple days in hospital after hysterectomy years ago  . COPD (chronic obstructive pulmonary disease) ([place])   . Depression    in the past  . Fibromyalgia   . GERD (gastroesophageal reflux disease)   . Headache(784.0)    migraines  . Hyperlipidemia   . Hypertension   . Lupus   . Macular degeneration    left eye  . Migraine   . Neuropathy ([place])   . OSA (obstructive sleep apnea) 04/22/2016   Moderate with AHI 16/hr on CPAP  . Peptic ulcer disease   . Restless legs   . Shortness of breath dyspnea   . Sleep apnea    last sleep study 2000, not using CPAP  . SVT (supraventricular tachycardia) ([place]) 12/03/2015   ablation for SVT    Past Surgical History:  Procedure Laterality Date  . ABDOMINAL HYSTERECTOMY    . ABLATION OF DYSRHYTHMIC FOCUS  12/03/2015   SVT  . ANTERIOR CERVICAL DECOMP/DISCECTOMY FUSION  07/20/2012   Procedure: ANTERIOR CERVICAL DECOMPRESSION/DISCECTOMY FUSION 2 LEVELS;  Surgeon: Charlie Pitter, MD;  Location: [place] NEURO ORS;  Service: Neurosurgery;  Laterality: N/A;  cervical three- four , four -  five  . APPENDECTOMY    . BACK SURGERY     lumbar  fusion  . CARDIAC CATHETERIZATION N/A 09/14/2015   Procedure: Left Heart Cath and Coronary Angiography;  Surgeon: Sherren Mocha, MD;  Location: [place] CV LAB;  Service: Cardiovascular;  Laterality: N/A;  . CARPAL TUNNEL RELEASE     bilat  . CERVICAL FUSION     C2/3  . ELECTROPHYSIOLOGIC STUDY N/A 12/03/2015   Procedure: SVT Ablation;  Surgeon: Evans Lance, MD;  Location: [place] CV LAB;  Service: Cardiovascular;  Laterality: N/A;  . EYE SURGERY Bilateral    cataract surgery with lens implant  . KNEE ARTHROSCOPY Left   . NEUROMA SURGERY     L foot  . SHOULDER SURGERY    . TONSILLECTOMY    . TOTAL KNEE ARTHROPLASTY Left 10/01/2016   Procedure: LEFT TOTAL KNEE ARTHROPLASTY;  Surgeon: Marybelle Killings, MD;  Location: [place];  Service: Orthopedics;  Laterality: Left;  . ULNAR NERVE TRANSPOSITION  07/20/2012   Procedure: ULNAR NERVE DECOMPRESSION/TRANSPOSITION;  Surgeon: Charlie Pitter, MD;  Location: [place] NEURO ORS;  Service: Neurosurgery;  Laterality: Left;    There were no vitals filed for this visit.      Subjective Assessment - 11/18/16 0930    Subjective Pt reports hip is a little better after last treatment, used tennis ball and heat which were helpful.  Knee feels good today.    Patient Stated Goals maneuver stairs at home, walking for exercise.    Currently in Pain? No/denies                         [facility] Adult PT Treatment/Exercise - 11/18/16 0001      Knee/Hip Exercises: Stretches   Passive Hamstring Stretch Limitations seated with strap   Gastroc Stretch 2 reps;30 seconds   Gastroc Stretch Limitations slant board   Other Knee/Hip Stretches seated figure 4   Other Knee/Hip Stretches heel prop 2# 3 min     Knee/Hip Exercises: Aerobic   Recumbent Bike L5 5 min     Knee/Hip Exercises: Standing   Heel Raises Limitations at counter   Step Down Limitations heel tap to floor from 4" step     Knee/Hip Exercises: Seated   Long Arc Quad Limitations seated  EOB 5s holds with slow lower     Moist Heat Therapy   Number Minutes Moist Heat 15 Minutes  4 min concurrent with education   Moist Heat Location Hip  hip/hamstrings     Manual Therapy   Soft tissue mobilization roller L hip, ITB, hamstring                PT Education - 11/18/16 0935    Education provided Yes   Education Details exercise form/rationale, HEP, POC, bursa irritation, importance of continued exercises and frequency, maintenance and continued improvement.    Person(s) Educated Patient   Methods Explanation;Demonstration;Tactile cues;Verbal cues   Comprehension Verbalized understanding;Returned demonstration;Verbal cues required;Tactile cues required;Need further instruction          PT Short Term Goals - 11/11/16 1020      PT SHORT TERM GOAL #1   Title FOTO to 59% ability to indicate significant improvement in functional ability by 2/9   Baseline 60% ability on 1/30   Status Achieved     PT SHORT TERM GOAL #2   Title Pt will demo good eccentric control to descend stairs, able with only use of one upper extremity for balance for safe navigation of stairs at home   Baseline fatigue with eccentric control, cont to use bilateral UE    Status On-going     PT SHORT TERM GOAL #3   Title 0-120 deg knee ROM for proper functional range   Baseline see flowsheet   Status On-going     PT SHORT TERM GOAL #4   Title hip MMT all to at least 4+/5 to indicate proximal stability for distal control.    Baseline see flowsheet   Status On-going                  Plan - 11/18/16 1020    Clinical Impression Statement pt with decreased tenderness to musculature surrounding hip. Was educated on tightness creating compression on bursa and resulting in irritation. Pt was provided with final HEP printout for review prior to d/c at next visit.    PT Next Visit Plan d/c   Consulted and Agree with Plan of Care Patient      Patient will benefit from skilled  therapeutic intervention in order to improve the following deficits and impairments:     Visit Diagnosis: Stiffness of left knee, not elsewhere classified  Muscle weakness (generalized)     Problem List Patient Active Problem List   Diagnosis Date Noted  . Arthritis of left knee 10/01/2016  . OSA (obstructive sleep apnea)  04/22/2016  . SVT (supraventricular tachycardia) ([place]) 11/08/2015  . SOB (shortness of breath) 08/23/2015  . Allergic rhinitis 08/22/2015  . Absolute anemia 08/22/2015  . Essential (primary) hypertension 08/22/2015  . Gastro-esophageal reflux disease without esophagitis 08/22/2015  . Hereditary and idiopathic neuropathy 08/22/2015  . Migraine with typical aura 08/22/2015  . Unilateral primary osteoarthritis, left knee 08/22/2015  . Borderline diabetes mellitus 08/22/2015  . Pure hypercholesterolemia 08/22/2015  . Systemic sclerosis ([place]) 08/22/2015  . Fast heart beat 08/22/2015  . Absence of bladder continence 08/22/2015  . Apnea, sleep 07/16/2015  . Cataract 09/26/2013  . Cataract of left eye 08/29/2013  . Cervical radiculopathy 07/20/2012  . Ulnar neuropathy at elbow 07/20/2012   Mylee Falin C. Jahzara Slattery PT, DPT 11/18/16 10:23 AM   [place] [facility] [address] [place], [place], [address] Phone: [phone]   Fax:  [phone]  Name: Jessica Watts MRN: [id number] Date of Birth: March 31, 1946

## 2016-11-20 ENCOUNTER — Ambulatory Visit: Payer: PPO | Admitting: Physical Therapy

## 2016-11-20 DIAGNOSIS — M25662 Stiffness of left knee, not elsewhere classified: Secondary | ICD-10-CM

## 2016-11-20 DIAGNOSIS — M6281 Muscle weakness (generalized): Secondary | ICD-10-CM

## 2016-11-20 NOTE — Therapy (Addendum)
[place], [place], [address] Phone: [phone]   Fax:  [phone]  Physical Therapy Treatment/Discharge Summary  Patient Details  Name: Jessica Watts MRN: [id number] Date of Birth: 1946-02-21 Referring Provider: Rodell Perna, MD  Encounter Date: 11/20/2016      PT End of Session - 11/20/16 0937    Visit Number 7   Number of Visits 9   Date for PT Re-Evaluation 11/21/16   Authorization Type MCR KX at visit 15   PT Start Time 0931   PT Stop Time 1012   PT Time Calculation (min) 41 min      Past Medical History:  Diagnosis Date  . Arthritis   . Asthma   . Complication of anesthesia    has difficulties related to being Native American; multiple days in hospital after hysterectomy years ago  . COPD (chronic obstructive pulmonary disease) ([place])   . Depression    in the past  . Fibromyalgia   . GERD (gastroesophageal reflux disease)   . Headache(784.0)    migraines  . Hyperlipidemia   . Hypertension   . Lupus   . Macular degeneration    left eye  . Migraine   . Neuropathy ([place])   . OSA (obstructive sleep apnea) 04/22/2016   Moderate with AHI 16/hr on CPAP  . Peptic ulcer disease   . Restless legs   . Shortness of breath dyspnea   . Sleep apnea    last sleep study 2000, not using CPAP  . SVT (supraventricular tachycardia) ([place]) 12/03/2015   ablation for SVT    Past Surgical History:  Procedure Laterality Date  . ABDOMINAL HYSTERECTOMY    . ABLATION OF DYSRHYTHMIC FOCUS  12/03/2015   SVT  . ANTERIOR CERVICAL DECOMP/DISCECTOMY FUSION  07/20/2012   Procedure: ANTERIOR CERVICAL DECOMPRESSION/DISCECTOMY FUSION 2 LEVELS;  Surgeon: Charlie Pitter, MD;  Location: [facility];  Service: Neurosurgery;  Laterality: N/A;  cervical three- four , four -  five  . APPENDECTOMY    . BACK SURGERY     lumbar fusion  . CARDIAC CATHETERIZATION N/A 09/14/2015   Procedure: Left Heart Cath and Coronary  Angiography;  Surgeon: Sherren Mocha, MD;  Location: [facility];  Service: Cardiovascular;  Laterality: N/A;  . CARPAL TUNNEL RELEASE     bilat  . CERVICAL FUSION     C2/3  . ELECTROPHYSIOLOGIC STUDY N/A 12/03/2015   Procedure: SVT Ablation;  Surgeon: Evans Lance, MD;  Location: [facility];  Service: Cardiovascular;  Laterality: N/A;  . EYE SURGERY Bilateral    cataract surgery with lens implant  . KNEE ARTHROSCOPY Left   . NEUROMA SURGERY     L foot  . SHOULDER SURGERY    . TONSILLECTOMY    . TOTAL KNEE ARTHROPLASTY Left 10/01/2016   Procedure: LEFT TOTAL KNEE ARTHROPLASTY;  Surgeon: Marybelle Killings, MD;  Location: [facility];  Service: Orthopedics;  Laterality: Left;  . ULNAR NERVE TRANSPOSITION  07/20/2012   Procedure: ULNAR NERVE DECOMPRESSION/TRANSPOSITION;  Surgeon: Charlie Pitter, MD;  Location: [facility];  Service: Neurosurgery;  Laterality: Left;    There were no vitals filed for this visit.      Subjective Assessment - 11/20/16 0936    Subjective No pain. Hip feels much better,     Currently in Pain? No/denies            [facility] PT Assessment - 11/20/16 0001  PROM   Left Knee Extension -5   Left Knee Flexion 125     Strength   Left Hip Flexion 4+/5  no notable quad lag   Left Hip Extension 4+/5   Left Hip ABduction 5/5   Left Knee Flexion 5/5   Left Knee Extension 5/5                     [facility] Adult PT Treatment/Exercise - 11/20/16 0001      Knee/Hip Exercises: Stretches   Passive Hamstring Stretch Limitations seated with strap   Gastroc Stretch 2 reps;30 seconds   Gastroc Stretch Limitations edge of step   Other Knee/Hip Stretches seated figure 4   Other Knee/Hip Stretches heel prop 2# 3 min     Knee/Hip Exercises: Aerobic   Recumbent Bike L5 5 min     Knee/Hip Exercises: Standing   Heel Raises Limitations at counter   Forward Step Up 20 reps   SLS 30 sec left    Gait Training 12 6 inch steps ascend and descend  alternating pattern with 1 hand rail and good control      Knee/Hip Exercises: Seated   Long Arc Quad Limitations 20 with red band    Hamstring Limitations red band x 20     Knee/Hip Exercises: Supine   Quad Sets 10 reps   Straight Leg Raises 10 reps   Straight Leg Raises Limitations cues for initial quad set                   PT Short Term Goals - 11/20/16 1020      PT SHORT TERM GOAL #1   Title FOTO to 59% ability to indicate significant improvement in functional ability by 2/9   Baseline 60% ability on 1/30   Time 4   Period Weeks   Status Achieved     PT SHORT TERM GOAL #2   Title Pt will demo good eccentric control to descend stairs, able with only use of one upper extremity for balance for safe navigation of stairs at home   Time 4   Period Weeks   Status Achieved     PT SHORT TERM GOAL #3   Title 0-120 deg knee ROM for proper functional range   Baseline 5-125   Time 4   Period Weeks   Status Partially Met     PT SHORT TERM GOAL #4   Title hip MMT all to at least 4+/5 to indicate proximal stability for distal control.    Time 4   Period Weeks   Status Achieved                  Plan - 11/20/16 1020    Clinical Impression Statement good eccentric control with descending stairs with one handrail. 4+/5 to 5/5 Left LE strength. AROM 5-125 left knee. Pt is independent with HEP and ready for discharge. All goals met or partially met.     PT Next Visit Plan discharge today    PT Home Exercise Plan home health exercises, heel prop, heel-toe gait, knee theraband seated , step ups , gastroc stretch edge of step, seated hamstring stretch, toe raises at wall.    Consulted and Agree with Plan of Care Patient      Gcode: Other PT primary goal status Current level CI D/C level CI    Patient will benefit from skilled therapeutic intervention in order to improve the following deficits and impairments:  Decreased  range of motion, Decreased activity  tolerance, Impaired flexibility, Increased edema, Decreased strength  Visit Diagnosis: Stiffness of left knee, not elsewhere classified  Muscle weakness (generalized)     Problem List Patient Active Problem List   Diagnosis Date Noted  . Arthritis of left knee 10/01/2016  . OSA (obstructive sleep apnea) 04/22/2016  . SVT (supraventricular tachycardia) ([place]) 11/08/2015  . SOB (shortness of breath) 08/23/2015  . Allergic rhinitis 08/22/2015  . Absolute anemia 08/22/2015  . Essential (primary) hypertension 08/22/2015  . Gastro-esophageal reflux disease without esophagitis 08/22/2015  . Hereditary and idiopathic neuropathy 08/22/2015  . Migraine with typical aura 08/22/2015  . Unilateral primary osteoarthritis, left knee 08/22/2015  . Borderline diabetes mellitus 08/22/2015  . Pure hypercholesterolemia 08/22/2015  . Systemic sclerosis ([place]) 08/22/2015  . Fast heart beat 08/22/2015  . Absence of bladder continence 08/22/2015  . Apnea, sleep 07/16/2015  . Cataract 09/26/2013  . Cataract of left eye 08/29/2013  . Cervical radiculopathy 07/20/2012  . Ulnar neuropathy at elbow 07/20/2012    Dorene Ar, PTA 11/20/2016, 10:24 AM  [facility] [address] [place], [place], [address] Phone: [phone]   Fax:  [phone]  Name: CAIDANCE SYBERT MRN: [id number] Date of Birth: 09/17/1946  PHYSICAL THERAPY DISCHARGE SUMMARY  Visits from Start of Care: 7  Current functional level related to goals / functional outcomes: See above   Remaining deficits: See above   Education / Equipment: Anatomy of condition, POC, HEP, exercise form/rationale  Plan: Patient agrees to discharge.  Patient goals were met. Patient is being discharged due to meeting the stated rehab goals.  ?????    Mohammed Mcandrew C. Hightower PT, DPT 11/20/16 2:21 PM

## 2016-11-25 ENCOUNTER — Ambulatory Visit: Payer: PPO | Admitting: Physical Therapy

## 2016-11-26 DIAGNOSIS — I10 Essential (primary) hypertension: Secondary | ICD-10-CM | POA: Diagnosis not present

## 2016-11-26 DIAGNOSIS — R7303 Prediabetes: Secondary | ICD-10-CM | POA: Diagnosis not present

## 2016-11-26 DIAGNOSIS — E7800 Pure hypercholesterolemia, unspecified: Secondary | ICD-10-CM | POA: Diagnosis not present

## 2016-11-27 ENCOUNTER — Ambulatory Visit: Payer: PPO | Admitting: Physical Therapy

## 2016-11-29 DIAGNOSIS — G4733 Obstructive sleep apnea (adult) (pediatric): Secondary | ICD-10-CM | POA: Diagnosis not present

## 2016-12-02 ENCOUNTER — Ambulatory Visit: Payer: PPO | Admitting: Physical Therapy

## 2016-12-10 ENCOUNTER — Ambulatory Visit (INDEPENDENT_AMBULATORY_CARE_PROVIDER_SITE_OTHER): Payer: PPO | Admitting: Orthopaedic Surgery

## 2016-12-27 DIAGNOSIS — G4733 Obstructive sleep apnea (adult) (pediatric): Secondary | ICD-10-CM | POA: Diagnosis not present

## 2017-01-07 ENCOUNTER — Encounter (INDEPENDENT_AMBULATORY_CARE_PROVIDER_SITE_OTHER): Payer: Self-pay | Admitting: Orthopaedic Surgery

## 2017-01-07 ENCOUNTER — Ambulatory Visit (INDEPENDENT_AMBULATORY_CARE_PROVIDER_SITE_OTHER): Payer: PPO | Admitting: Orthopaedic Surgery

## 2017-01-07 VITALS — Ht 60.0 in | Wt 209.0 lb

## 2017-01-07 DIAGNOSIS — Z96652 Presence of left artificial knee joint: Secondary | ICD-10-CM

## 2017-01-07 NOTE — Progress Notes (Signed)
Office Visit Note   Patient: Jessica Watts           Date of Birth: 1945-11-09           MRN: [id number] Visit Date: 01/07/2017              Requested by: Kelton Pillar, MD [address] [place], [place] [address] PCP: Osborne Casco, MD   Assessment & Plan: Visit Diagnoses:  1. Status post total left knee replacement     Plan: Patient doing very well. She'll continue home excised program. Follow-up in 3 months for recheck and we'll repeat left knee x-rays at that time.  Follow-Up Instructions: Return in about 3 months (around 04/09/2017).   Orders:  No orders of the defined types were placed in this encounter.  No orders of the defined types were placed in this encounter.     Procedures: No procedures performed   Clinical Data: No additional findings.   Subjective: Chief Complaint  Patient presents with  . Left Knee - Routine Post Op    Patient returns for follow up. She is status post left total knee arthroplasty on 10/01/2016. She is 98 days out. She states that she is doing well. She has completed physical therapy but continues to do home exercises. She states that the knee gets stiff if she sits too long, otherwise, she has no complaints. She ambulates with a cane today.   Overall patient is doing very well and pleased with her surgical result up to this point.  Review of Systems  Constitutional: Negative.   Musculoskeletal: Positive for arthralgias.  Skin: Negative.   Neurological: Negative.   Psychiatric/Behavioral: Negative.      Objective: Vital Signs: Ht 5' (1.524 m)   Wt 209 lb (94.8 kg)   BMI 40.82 kg/m   Physical Exam  Constitutional: She is oriented to person, place, and time. No distress.  HENT:  Head: Normocephalic and atraumatic.  Eyes: Pupils are equal, round, and reactive to light.  Musculoskeletal:  Gait is slightly antalgic with a single-prong cane. Left knee active motion 0-115 degrees. Not much  swelling. Calf nontender.  Neurological: She is alert and oriented to person, place, and time.  Skin: Skin is warm and dry.  Psychiatric: She has a normal mood and affect.    Ortho Exam  Specialty Comments:  No specialty comments available.  Imaging: No results found.   PMFS History: Patient Active Problem List   Diagnosis Date Noted  . Arthritis of left knee 10/01/2016  . OSA (obstructive sleep apnea) 04/22/2016  . SVT (supraventricular tachycardia) ([place]) 11/08/2015  . SOB (shortness of breath) 08/23/2015  . Allergic rhinitis 08/22/2015  . Absolute anemia 08/22/2015  . Essential (primary) hypertension 08/22/2015  . Gastro-esophageal reflux disease without esophagitis 08/22/2015  . Hereditary and idiopathic neuropathy 08/22/2015  . Migraine with typical aura 08/22/2015  . Unilateral primary osteoarthritis, left knee 08/22/2015  . Borderline diabetes mellitus 08/22/2015  . Pure hypercholesterolemia 08/22/2015  . Systemic sclerosis ([place]) 08/22/2015  . Fast heart beat 08/22/2015  . Absence of bladder continence 08/22/2015  . Apnea, sleep 07/16/2015  . Cataract 09/26/2013  . Cataract of left eye 08/29/2013  . Cervical radiculopathy 07/20/2012  . Ulnar neuropathy at elbow 07/20/2012   Past Medical History:  Diagnosis Date  . Arthritis   . Asthma   . Complication of anesthesia    has difficulties related to being Native American; multiple days in hospital after hysterectomy years ago  .  COPD (chronic obstructive pulmonary disease) ([place])   . Depression    in the past  . Fibromyalgia   . GERD (gastroesophageal reflux disease)   . Headache(784.0)    migraines  . Hyperlipidemia   . Hypertension   . Lupus   . Macular degeneration    left eye  . Migraine   . Neuropathy ([place])   . OSA (obstructive sleep apnea) 04/22/2016   Moderate with AHI 16/hr on CPAP  . Peptic ulcer disease   . Restless legs   . Shortness of breath dyspnea   . Sleep apnea    last sleep study  2000, not using CPAP  . SVT (supraventricular tachycardia) ([place]) 12/03/2015   ablation for SVT    Family History  Problem Relation Age of Onset  . Heart disease Mother   . Heart attack Mother   . Hypertension Mother   . Heart attack Father   . Heart disease Father   . Hypertension Father     Past Surgical History:  Procedure Laterality Date  . ABDOMINAL HYSTERECTOMY    . ABLATION OF DYSRHYTHMIC FOCUS  12/03/2015   SVT  . ANTERIOR CERVICAL DECOMP/DISCECTOMY FUSION  07/20/2012   Procedure: ANTERIOR CERVICAL DECOMPRESSION/DISCECTOMY FUSION 2 LEVELS;  Surgeon: Charlie Pitter, MD;  Location: [place] [facility];  Service: Neurosurgery;  Laterality: N/A;  cervical three- four , four -  five  . APPENDECTOMY    . BACK SURGERY     lumbar fusion  . CARDIAC CATHETERIZATION N/A 09/14/2015   Procedure: Left Heart Cath and Coronary Angiography;  Surgeon: Sherren Mocha, MD;  Location: [facility];  Service: Cardiovascular;  Laterality: N/A;  . CARPAL TUNNEL RELEASE     bilat  . CERVICAL FUSION     C2/3  . ELECTROPHYSIOLOGIC STUDY N/A 12/03/2015   Procedure: SVT Ablation;  Surgeon: Evans Lance, MD;  Location: [place] [facility];  Service: Cardiovascular;  Laterality: N/A;  . EYE SURGERY Bilateral    cataract surgery with lens implant  . KNEE ARTHROSCOPY Left   . NEUROMA SURGERY     L foot  . SHOULDER SURGERY    . TONSILLECTOMY    . TOTAL KNEE ARTHROPLASTY Left 10/01/2016   Procedure: LEFT TOTAL KNEE ARTHROPLASTY;  Surgeon: Marybelle Killings, MD;  Location: [facility];  Service: Orthopedics;  Laterality: Left;  . ULNAR NERVE TRANSPOSITION  07/20/2012   Procedure: ULNAR NERVE DECOMPRESSION/TRANSPOSITION;  Surgeon: Charlie Pitter, MD;  Location: [place] [facility];  Service: Neurosurgery;  Laterality: Left;   Social History   Occupational History  . Not on file.   Social History Main Topics  . Smoking status: Never Smoker  . Smokeless tobacco: Never Used  . Alcohol use Yes     Comment: occasional wine    . Drug use: No  . Sexual activity: Not on file

## 2017-01-12 IMAGING — CR DG KNEE 1-2V*L*
2 series · 2 of 2 positions shown · non-contrast
Comparison: None.

CLINICAL DATA: Status post left knee replacement

EXAM:
LEFT KNEE - 1-2 VIEW

[AP]
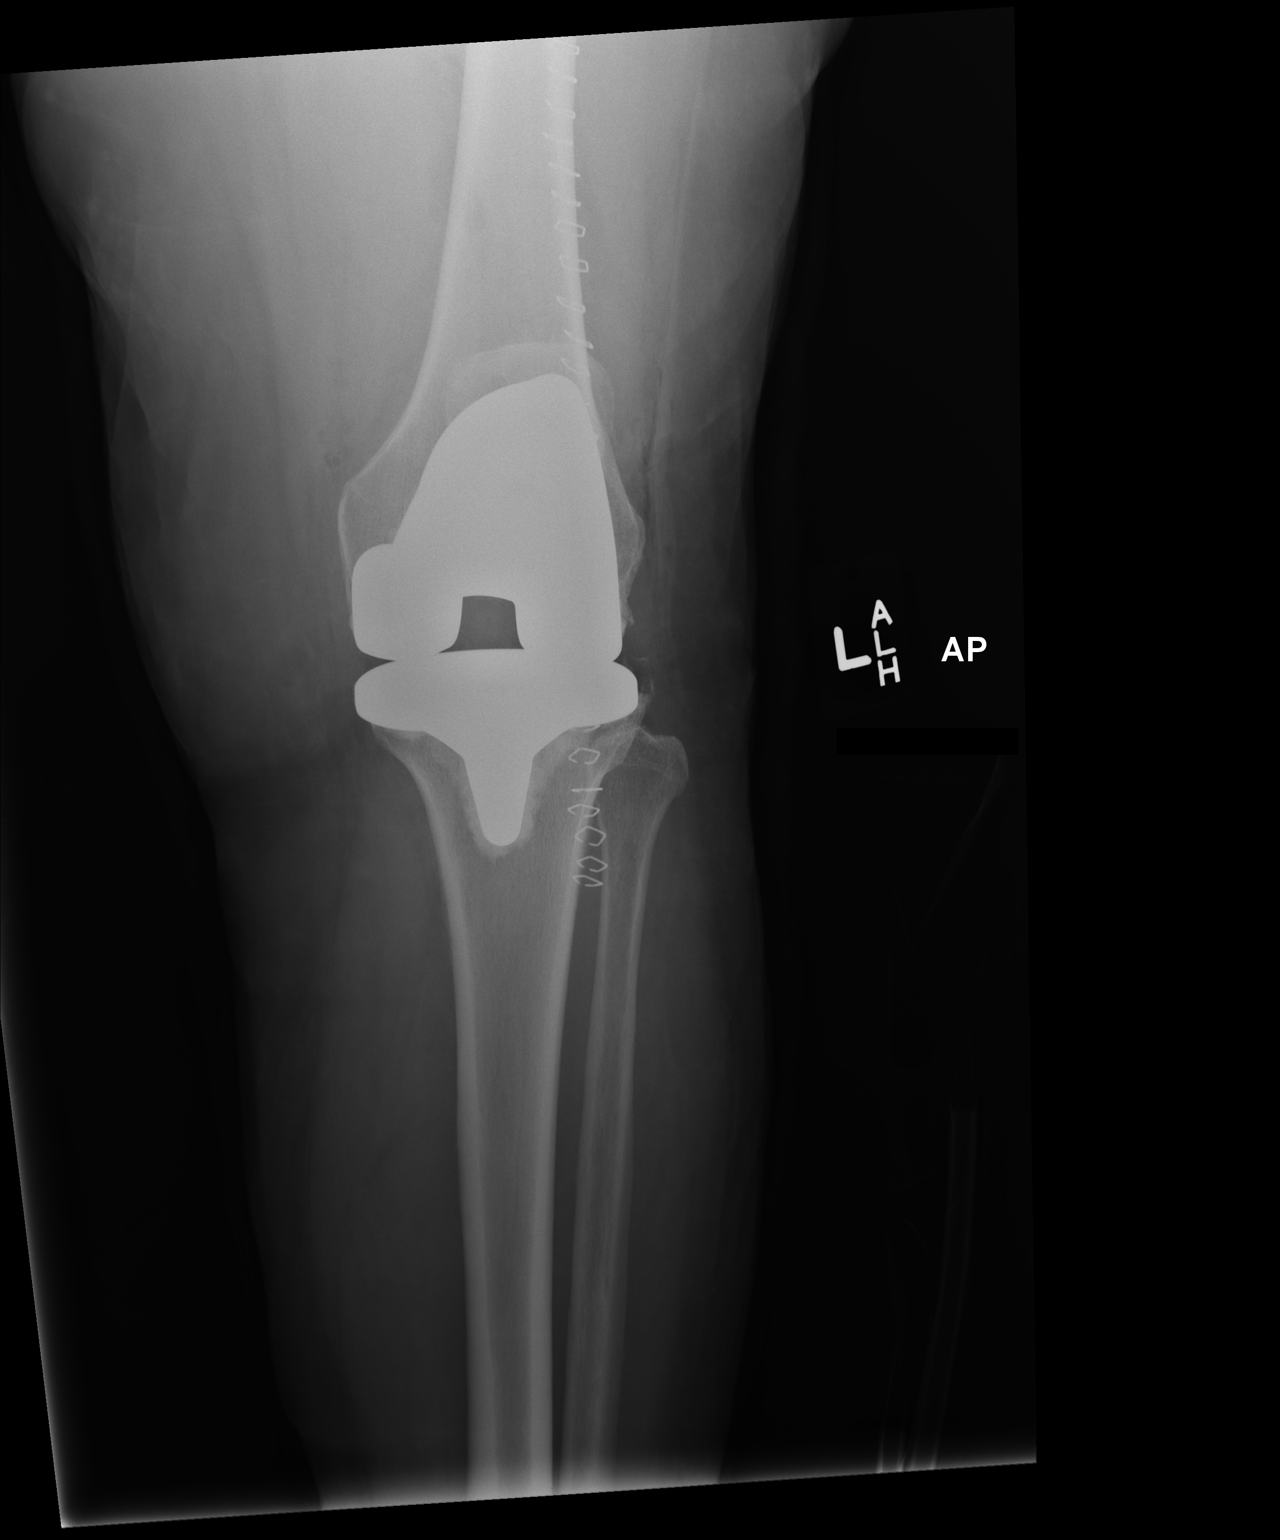

[xtable lateral]
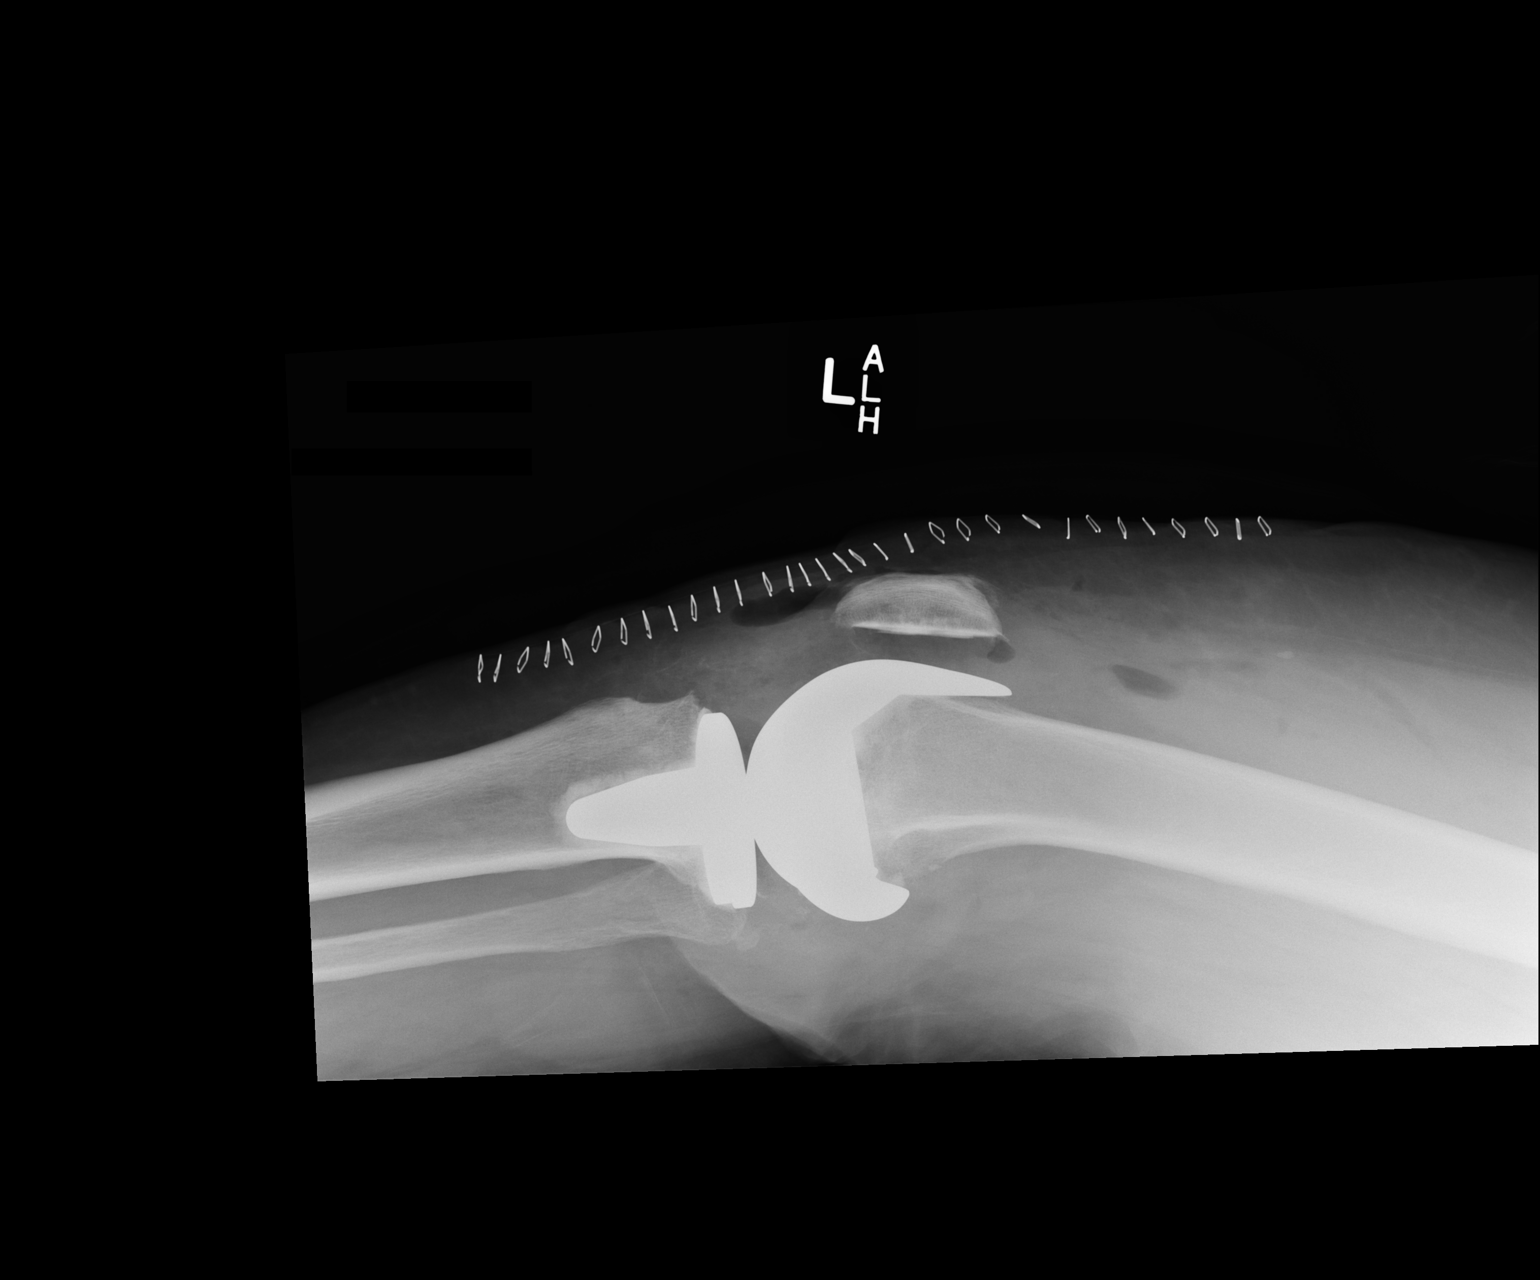

[2 of 2 positions shown; findings below may reference images not displayed]

FINDINGS: Left knee replacement is noted. No acute bony or soft tissue
abnormality is seen.
IMPRESSION: Status post left knee replacement.

## 2017-02-10 ENCOUNTER — Encounter: Payer: Self-pay | Admitting: Cardiology

## 2017-02-26 ENCOUNTER — Encounter: Payer: Self-pay | Admitting: Cardiology

## 2017-02-26 ENCOUNTER — Ambulatory Visit (INDEPENDENT_AMBULATORY_CARE_PROVIDER_SITE_OTHER): Payer: PPO | Admitting: Cardiology

## 2017-02-26 VITALS — BP 148/64 | HR 104 | Ht 60.0 in | Wt 211.8 lb

## 2017-02-26 DIAGNOSIS — I471 Supraventricular tachycardia: Secondary | ICD-10-CM | POA: Diagnosis not present

## 2017-02-26 DIAGNOSIS — I10 Essential (primary) hypertension: Secondary | ICD-10-CM

## 2017-02-26 DIAGNOSIS — G4733 Obstructive sleep apnea (adult) (pediatric): Secondary | ICD-10-CM

## 2017-02-26 NOTE — Progress Notes (Signed)
Cardiology Office Note    Date:  02/26/2017   ID:  Jessica Watts, DOB 1946/01/03, MRN [id number]  PCP:  Kelton Pillar, MD  Cardiologist:  Fransico Him, MD   Chief Complaint  Patient presents with  . Sleep Apnea  . Hypertension    History of Present Illness:  Jessica Watts is a 71 y.o. female  presents for followup of SOB, chest pain and palpitations. She has a history of chest pain and SOB and  cath showed normal coronary arteries and normal LVEDP and nuclear stress test was a false positive test.  2D echo showed normal LVF and trivial MR.  She was found to have SVT on heart monitor and underwent ablation for AVNRT by Dr. Lovena Le 12/03/2015.  She is here today for followup and is doing well.  She denies any chest pain or pressure, dizziness (except for vertigo) or syncope.   Since I saw her last she has had very few episodes of palpitations that only last a few seconds and resolve immediately.  She occasionally has some DOE except for extreme walking but that has improved. She says that she had has some LE edema recently while doing jury duty and was eating higher sodium than she usually does.  She also has moderate OSA with an AHI of 16/hr and and is on CPAP at 14cm H2O.  She uses her device but feels like it is smothering her.  She uses the full face mask and it hurts the bridge of her nose. She feels the pressure is adequate.  Since going on CPAP she felt more rested in the beginning in the am and has less daytime sleepiness but now since having problems with her mask she is not sleeping as well and has not been as compliant. She has had  problems with dry mouth but that was present prior to starting CPAP.  She does not think that she snores.      Past Medical History:  Diagnosis Date  . Arthritis   . Asthma   . Complication of anesthesia    has difficulties related to being Native American; multiple days in hospital after hysterectomy years ago  . COPD (chronic obstructive  pulmonary disease) ([place])   . Depression    in the past  . Fibromyalgia   . GERD (gastroesophageal reflux disease)   . Headache(784.0)    migraines  . Hyperlipidemia   . Hypertension   . Lupus   . Macular degeneration    left eye  . Migraine   . Neuropathy   . OSA (obstructive sleep apnea) 04/22/2016   Moderate with AHI 16/hr on CPAP  . Peptic ulcer disease   . Restless legs   . SVT (supraventricular tachycardia) ([place]) 12/03/2015   ablation for SVT    Past Surgical History:  Procedure Laterality Date  . ABDOMINAL HYSTERECTOMY    . ABLATION OF DYSRHYTHMIC FOCUS  12/03/2015   SVT  . ANTERIOR CERVICAL DECOMP/DISCECTOMY FUSION  07/20/2012   Procedure: ANTERIOR CERVICAL DECOMPRESSION/DISCECTOMY FUSION 2 LEVELS;  Surgeon: Charlie Pitter, MD;  Location: [place] [facility];  Service: Neurosurgery;  Laterality: N/A;  cervical three- four , four -  five  . APPENDECTOMY    . BACK SURGERY     lumbar fusion  . CARDIAC CATHETERIZATION N/A 09/14/2015   Procedure: Left Heart Cath and Coronary Angiography;  Surgeon: Sherren Mocha, MD;  Location: [facility];  Service: Cardiovascular;  Laterality: N/A;  . CARPAL TUNNEL RELEASE  bilat  . CERVICAL FUSION     C2/3  . ELECTROPHYSIOLOGIC STUDY N/A 12/03/2015   Procedure: SVT Ablation;  Surgeon: Evans Lance, MD;  Location: [place] [facility];  Service: Cardiovascular;  Laterality: N/A;  . EYE SURGERY Bilateral    cataract surgery with lens implant  . KNEE ARTHROSCOPY Left   . NEUROMA SURGERY     L foot  . SHOULDER SURGERY    . TONSILLECTOMY    . TOTAL KNEE ARTHROPLASTY Left 10/01/2016   Procedure: LEFT TOTAL KNEE ARTHROPLASTY;  Surgeon: Marybelle Killings, MD;  Location: [place];  Service: Orthopedics;  Laterality: Left;  . ULNAR NERVE TRANSPOSITION  07/20/2012   Procedure: ULNAR NERVE DECOMPRESSION/TRANSPOSITION;  Surgeon: Charlie Pitter, MD;  Location: [facility];  Service: Neurosurgery;  Laterality: Left;    Current Medications: Current Meds    Medication Sig  . albuterol (PROAIR HFA) 108 (90 BASE) MCG/ACT inhaler Inhale 2 puffs into the lungs every 4 (four) hours as needed for wheezing or shortness of breath.   Marland Kitchen aspirin EC 81 MG tablet Take 81 mg by mouth daily.  . calcium carbonate (OS-CAL - DOSED IN MG OF ELEMENTAL CALCIUM) 1250 (500 Ca) MG tablet Take 1 tablet by mouth daily.  . chlorpheniramine (CHLOR-TRIMETON) 4 MG tablet Take 4 mg by mouth daily.  . cholecalciferol (VITAMIN D) 1000 units tablet Take 1,000 Units by mouth daily.  . cycloSPORINE (RESTASIS) 0.05 % ophthalmic emulsion Place 1 drop into both eyes 2 (two) times daily as needed (dry eyes).  . fluticasone (FLONASE) 50 MCG/ACT nasal spray Place 1 spray into both nostrils daily as needed for allergies. For nasal congestion  . losartan-hydrochlorothiazide (HYZAAR) 100-12.5 MG tablet Take 1 tablet by mouth daily.  Marland Kitchen omeprazole (PRILOSEC) 40 MG capsule Take 40 mg by mouth daily.  . potassium chloride SA (K-DUR,KLOR-CON) 20 MEQ tablet Take 20 mEq by mouth daily.    Allergies:   Ace inhibitors; Amoxicillin; and Sulfa antibiotics   Social History   Social History  . Marital status: Divorced    Spouse name: N/A  . Number of children: N/A  . Years of education: N/A   Social History Main Topics  . Smoking status: Never Smoker  . Smokeless tobacco: Never Used  . Alcohol use Yes     Comment: occasional wine  . Drug use: No  . Sexual activity: Not Asked   Other Topics Concern  . None   Social History Narrative  . None     Family History:  The patient's family history includes Heart attack in her father and mother; Heart disease in her father and mother; Hypertension in her father and mother.   ROS:   Please see the history of present illness.    Review of Systems  Cardiovascular: Positive for leg swelling.   All other systems reviewed and are negative.  No flowsheet data found.     PHYSICAL EXAM:   VS:  BP (!) 148/64   Pulse (!) 104   Ht 5' (1.524  m)   Wt 211 lb 12.8 oz (96.1 kg)   SpO2 98%   BMI 41.36 kg/m    GEN: Well nourished, well developed, in no acute distress  HEENT: normal  Neck: no JVD, carotid bruits, or masses Cardiac: RRR; no rubs, or gallops,no edema.  Intact distal pulses bilaterally. 1/6 SM at RUSB Respiratory:  clear to auscultation bilaterally, normal work of breathing GI: soft, nontender, nondistended, + BS MS: no deformity or  atrophy  Skin: warm and dry, no rash Neuro:  Alert and Oriented x 3, Strength and sensation are intact Psych: euthymic mood, full affect  Wt Readings from Last 3 Encounters:  02/26/17 211 lb 12.8 oz (96.1 kg)  01/07/17 209 lb (94.8 kg)  11/12/16 209 lb (94.8 kg)      Studies/Labs Reviewed:   EKG:  EKG is not ordered today.    Recent Labs: 10/04/2016: BUN <5; Creatinine, Ser 0.63; Hemoglobin 10.3; Platelets 132; Potassium 3.4; Sodium 136   Lipid Panel No results found for: CHOL, TRIG, HDL, CHOLHDL, VLDL, LDLCALC, LDLDIRECT  Additional studies/ records that were reviewed today include:  CPAP download    ASSESSMENT:    1. SVT (supraventricular tachycardia) ([place])   2. OSA (obstructive sleep apnea)   3. Essential (primary) hypertension      PLAN:  In order of problems listed above:  1.   SVT s/p ablation - she has not had any reoccurence of palpitations. OSA - the patient is tolerating PAP therapy but having problems tolerating the full face mask. The PAP download was reviewed today and showed an AHI of 0.5/hr on 14 cm H2O with 63% compliance in using more than 4 hours nightly.  The patient has been using and benefiting from CPAP use and will continue to benefit from therapy. I have encouraged her to be more compliant with her device to get full benefit as well as to continue to have insurance pay for her device. I am going to change her to a ResMed Airfit P20 mask with chin strap and repeat a download for compliance in 4 weeks.  HTN - Her BP is controlled on exam today.  She will continue on Hyzaar.      Medication Adjustments/Labs and Tests Ordered: Current medicines are reviewed at length with the patient today.  Concerns regarding medicines are outlined above.  Medication changes, Labs and Tests ordered today are listed in the Patient Instructions below.  There are no Patient Instructions on file for this visit.   Signed, Fransico Him, MD  02/26/2017 9:35 AM    [facility] [address], [place], [place]  [address] Phone: [phone]; Fax: [phone]

## 2017-02-26 NOTE — Patient Instructions (Signed)
Medication Instructions:  Your physician recommends that you continue on your current medications as directed. Please refer to the Current Medication list given to you today.   Labwork: None  Testing/Procedures: None  Follow-Up: Your physician wants you to follow-up in: 1 year with Dr. Radford Pax. You will receive a reminder letter in the mail two months in advance. If you don't receive a letter, please call our office to schedule the follow-up appointment.   Any Other Special Instructions Will Be Listed Below (If Applicable). Dr. Radford Pax has ordered for you a new mask and chin strap. Please call Gae Bon, our office PAP assistant, directly at [phone] if you do not hear from your supply company in the next week or so.     If you need a refill on your cardiac medications before your next appointment, please call your pharmacy.

## 2017-03-05 ENCOUNTER — Telehealth: Payer: Self-pay | Admitting: *Deleted

## 2017-03-05 NOTE — Telephone Encounter (Signed)
-----   Message from Theodoro Parma, RN sent at 02/26/2017  9:48 AM EDT ----- Regarding: DME order [employer] order is in for new mask and chin strap.  Would it be easier to pull a download 4 weeks later and send to [place] OR send Gae Bon notification that the patient has picked the new mask up so Gae Bon can pull the download in a month?  Let us know!

## 2017-03-05 NOTE — Telephone Encounter (Signed)
New mask and chin strap gets delivered tomorrow 03/06/2017  Pull download April 03 2017

## 2017-03-11 DIAGNOSIS — M1712 Unilateral primary osteoarthritis, left knee: Secondary | ICD-10-CM | POA: Diagnosis not present

## 2017-03-11 DIAGNOSIS — R269 Unspecified abnormalities of gait and mobility: Secondary | ICD-10-CM | POA: Diagnosis not present

## 2017-03-11 DIAGNOSIS — G4733 Obstructive sleep apnea (adult) (pediatric): Secondary | ICD-10-CM | POA: Diagnosis not present

## 2017-04-14 ENCOUNTER — Ambulatory Visit (INDEPENDENT_AMBULATORY_CARE_PROVIDER_SITE_OTHER): Payer: PPO | Admitting: Orthopaedic Surgery

## 2017-04-14 ENCOUNTER — Encounter (INDEPENDENT_AMBULATORY_CARE_PROVIDER_SITE_OTHER): Payer: Self-pay | Admitting: Orthopaedic Surgery

## 2017-04-14 ENCOUNTER — Ambulatory Visit (INDEPENDENT_AMBULATORY_CARE_PROVIDER_SITE_OTHER): Payer: PPO

## 2017-04-14 VITALS — BP 133/69 | Ht 60.5 in | Wt 210.0 lb

## 2017-04-14 DIAGNOSIS — Z96652 Presence of left artificial knee joint: Secondary | ICD-10-CM

## 2017-04-14 DIAGNOSIS — M5136 Other intervertebral disc degeneration, lumbar region: Secondary | ICD-10-CM

## 2017-04-14 DIAGNOSIS — M9983 Other biomechanical lesions of lumbar region: Secondary | ICD-10-CM | POA: Diagnosis not present

## 2017-04-14 DIAGNOSIS — M48061 Spinal stenosis, lumbar region without neurogenic claudication: Secondary | ICD-10-CM | POA: Insufficient documentation

## 2017-04-14 NOTE — Progress Notes (Signed)
Office Visit Note   Patient: Jessica Watts           Date of Birth: [birth year]           MRN: [id number] Visit Date: 04/14/2017              Requested by: Kelton Pillar, MD [facility] [address] [employer] [address] [place], [place] [address] PCP: Kelton Pillar, MD   Assessment & Plan: Visit Diagnoses:  1. Presence of left artificial knee joint   2. Other intervertebral disc degeneration, lumbar region   3. Lumbar foraminal stenosis     Plan: We went over exercises help her get full extension and stretch the posterior capsule of her knee. Similar symptoms may be related to her lumbar disease with disc degeneration and foraminal stenosis. I'll recheck her in 3 months. She does not have claudication symptoms at this point but does have some central and more significant foraminal stenosis mid lumbar region and more than one level. Recheck 3 months.  Follow-Up Instructions: Return in about 3 months (around 07/15/2017), or if symptoms worsen or fail to improve.   Orders:  Orders Placed This Encounter  Procedures  . XR Knee 1-2 Views Left   No orders of the defined types were placed in this encounter.     Procedures: No procedures performed   Clinical Data: No additional findings.   Subjective: Chief Complaint  Patient presents with  . Left Knee - Follow-up    HPI 71 year old female returns post left total knee arthroplasty 10/01/2016. She's been to some, does notice some discomfort over both the trochanter and also occasionally her knee swells at the end of the day. She's noticed some discomfort over the left trochanter. Previous lumbar surgery for disc herniation and previous MRI 2016 showed some disc protrusions and some foraminal stenosis at L3-4 with some bulging at 4-5 and at 5-1. She denies fever chills. 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 She denies chills or fever. We reviewed the MRI and pathology present on lumbar spine with levels of foraminal  stenosis and disc space degeneration primarily at L2-3 L3-4 and L4-5.  Review of Systems 14 for review of systems is updated from 01/07/2017 and is unchanged other than as mentioned above in history of present illness.   Objective: Vital Signs: BP 133/69   Ht 5' 0.5" (1.537 m)   Wt 210 lb (95.3 kg)   BMI 40.34 kg/m   Physical Exam  Constitutional: She is oriented to person, place, and time. She appears well-developed.  HENT:  Head: Normocephalic.  Right Ear: External ear normal.  Left Ear: External ear normal.  Eyes: Pupils are equal, round, and reactive to light.  Neck: No tracheal deviation present. No thyromegaly present.  Cardiovascular: Normal rate.   Pulmonary/Chest: Effort normal.  Abdominal: Soft.  Musculoskeletal:  Patient is well healed lumbar incision from past surgery. She has trochanteric bursal tenderness in the left minimal right mild sciatic notch tenderness. Negative straight leg raising 90. She lacks 5 date degrees reaching full extension and has some spring back of the posterior capsule. In the supine position history aspect of her knee does not touch the bed. She has good quad strength collateral ligaments are stable no knee effusion. Good flexion. Negative limp. Stride length is 1 inch shorter on the left total knee arthroplasty side versus the right.  Neurological: She is alert and oriented to person, place, and time.  Skin: Skin is warm and  dry.  Psychiatric: She has a normal mood and affect. Her behavior is normal.    Ortho Exam  Specialty Comments:  No specialty comments available.  Imaging: Xr Knee 1-2 Views Left  Result Date: 04/14/2017 Standing AP of both knees lateral left knee obtained and reviewed this shows well-positioned total knee arthroplasty left knee. She has minimal narrowing right knee no significant arthritic changes in the right knee. Impression: Satisfactory left total knee arthroplasty x-rays.    PMFS History: Patient Active  Problem List   Diagnosis Date Noted  . Presence of left artificial knee joint 04/14/2017  . Other intervertebral disc degeneration, lumbar region 04/14/2017  . Lumbar foraminal stenosis 04/14/2017  . Arthritis of left knee 10/01/2016  . OSA (obstructive sleep apnea) 04/22/2016  . SVT (supraventricular tachycardia) ([place]) 11/08/2015  . SOB (shortness of breath) 08/23/2015  . Allergic rhinitis 08/22/2015  . Absolute anemia 08/22/2015  . Essential (primary) hypertension 08/22/2015  . Gastro-esophageal reflux disease without esophagitis 08/22/2015  . Hereditary and idiopathic neuropathy 08/22/2015  . Migraine with typical aura 08/22/2015  . Unilateral primary osteoarthritis, left knee 08/22/2015  . Borderline diabetes mellitus 08/22/2015  . Pure hypercholesterolemia 08/22/2015  . Systemic sclerosis ([place]) 08/22/2015  . Fast heart beat 08/22/2015  . Absence of bladder continence 08/22/2015  . Apnea, sleep 07/16/2015  . Cataract 09/26/2013  . Cataract of left eye 08/29/2013  . Cervical radiculopathy 07/20/2012  . Ulnar neuropathy at elbow 07/20/2012   Past Medical History:  Diagnosis Date  . Arthritis   . Asthma   . Complication of anesthesia    has difficulties related to being Native American; multiple days in hospital after hysterectomy years ago  . COPD (chronic obstructive pulmonary disease) ([facility])   . Depression    in the past  . Fibromyalgia   . GERD (gastroesophageal reflux disease)   . Headache(784.0)    migraines  . Hyperlipidemia   . Hypertension   . Lupus   . Macular degeneration    left eye  . Migraine   . Neuropathy   . OSA (obstructive sleep apnea) 04/22/2016   Moderate with AHI 16/hr on CPAP  . Peptic ulcer disease   . Restless legs   . SVT (supraventricular tachycardia) ([place]) 12/03/2015   ablation for SVT    Family History  Problem Relation Age of Onset  . Heart disease Mother   . Heart attack Mother   . Hypertension Mother   . Heart attack Father   .  Heart disease Father   . Hypertension Father     Past Surgical History:  Procedure Laterality Date  . ABDOMINAL HYSTERECTOMY    . ABLATION OF DYSRHYTHMIC FOCUS  12/03/2015   SVT  . ANTERIOR CERVICAL DECOMP/DISCECTOMY FUSION  07/20/2012   Procedure: ANTERIOR CERVICAL DECOMPRESSION/DISCECTOMY FUSION 2 LEVELS;  Surgeon: Charlie Pitter, MD;  Location: [facility];  Service: Neurosurgery;  Laterality: N/A;  cervical three- four , four -  five  . APPENDECTOMY    . BACK SURGERY     lumbar fusion  . CARDIAC CATHETERIZATION N/A 09/14/2015   Procedure: Left Heart Cath and Coronary Angiography;  Surgeon: Sherren Mocha, MD;  Location: [facility];  Service: Cardiovascular;  Laterality: N/A;  . CARPAL TUNNEL RELEASE     bilat  . CERVICAL FUSION     C2/3  . ELECTROPHYSIOLOGIC STUDY N/A 12/03/2015   Procedure: SVT Ablation;  Surgeon: Evans Lance, MD;  Location: [facility];  Service: Cardiovascular;  Laterality: N/A;  . EYE SURGERY Bilateral    cataract surgery with lens implant  . KNEE ARTHROSCOPY Left   . NEUROMA SURGERY     L foot  . SHOULDER SURGERY    . TONSILLECTOMY    . TOTAL KNEE ARTHROPLASTY Left 10/01/2016   Procedure: LEFT TOTAL KNEE ARTHROPLASTY;  Surgeon: Marybelle Killings, MD;  Location: [facility];  Service: Orthopedics;  Laterality: Left;  . ULNAR NERVE TRANSPOSITION  07/20/2012   Procedure: ULNAR NERVE DECOMPRESSION/TRANSPOSITION;  Surgeon: Charlie Pitter, MD;  Location: [facility];  Service: Neurosurgery;  Laterality: Left;   Social History   Occupational History  . Not on file.   Social History Main Topics  . Smoking status: Never Smoker  . Smokeless tobacco: Never Used  . Alcohol use Yes     Comment: occasional wine  . Drug use: No  . Sexual activity: Not on file

## 2017-04-22 ENCOUNTER — Telehealth (INDEPENDENT_AMBULATORY_CARE_PROVIDER_SITE_OTHER): Payer: Self-pay | Admitting: Orthopaedic Surgery

## 2017-04-22 DIAGNOSIS — Z79899 Other long term (current) drug therapy: Secondary | ICD-10-CM | POA: Diagnosis not present

## 2017-04-22 DIAGNOSIS — Z049 Encounter for examination and observation for unspecified reason: Secondary | ICD-10-CM | POA: Diagnosis not present

## 2017-04-22 DIAGNOSIS — H43813 Vitreous degeneration, bilateral: Secondary | ICD-10-CM | POA: Diagnosis not present

## 2017-04-22 DIAGNOSIS — Z961 Presence of intraocular lens: Secondary | ICD-10-CM | POA: Diagnosis not present

## 2017-04-22 DIAGNOSIS — Z09 Encounter for follow-up examination after completed treatment for conditions other than malignant neoplasm: Secondary | ICD-10-CM | POA: Diagnosis not present

## 2017-04-22 DIAGNOSIS — M328 Other forms of systemic lupus erythematosus: Secondary | ICD-10-CM | POA: Diagnosis not present

## 2017-04-22 NOTE — Telephone Encounter (Signed)
Jessica Watts Self [phone]  Ceylin dropped off a [facility] Form to be completed by the doctor for her PT. Please mail in attached envelope when complete.

## 2017-04-24 NOTE — Telephone Encounter (Signed)
Form completed and mailed

## 2017-04-27 DIAGNOSIS — R269 Unspecified abnormalities of gait and mobility: Secondary | ICD-10-CM | POA: Diagnosis not present

## 2017-04-27 DIAGNOSIS — G4733 Obstructive sleep apnea (adult) (pediatric): Secondary | ICD-10-CM | POA: Diagnosis not present

## 2017-04-27 DIAGNOSIS — M1712 Unilateral primary osteoarthritis, left knee: Secondary | ICD-10-CM | POA: Diagnosis not present

## 2017-05-06 NOTE — Telephone Encounter (Signed)
Per [facility] Jessica Watts) patient received her new mask and chin strap on June 29. Download sent to scan.

## 2017-05-12 ENCOUNTER — Telehealth: Payer: Self-pay | Admitting: *Deleted

## 2017-05-12 NOTE — Telephone Encounter (Signed)
Informed patient of compliance results and patient understanding was verbalized. Patient understands her settings will not change. Patient was grateful for the call and thanked me.

## 2017-05-12 NOTE — Telephone Encounter (Signed)
-----   Message from Sueanne Margarita, MD sent at 05/06/2017  8:24 PM EDT ----- Good AHI and compliance.  Continue current CPAP settings.

## 2017-06-24 DIAGNOSIS — J309 Allergic rhinitis, unspecified: Secondary | ICD-10-CM | POA: Diagnosis not present

## 2017-06-24 DIAGNOSIS — E7800 Pure hypercholesterolemia, unspecified: Secondary | ICD-10-CM | POA: Diagnosis not present

## 2017-06-24 DIAGNOSIS — G43109 Migraine with aura, not intractable, without status migrainosus: Secondary | ICD-10-CM | POA: Diagnosis not present

## 2017-06-24 DIAGNOSIS — D649 Anemia, unspecified: Secondary | ICD-10-CM | POA: Diagnosis not present

## 2017-06-24 DIAGNOSIS — M1990 Unspecified osteoarthritis, unspecified site: Secondary | ICD-10-CM | POA: Diagnosis not present

## 2017-06-24 DIAGNOSIS — Z1389 Encounter for screening for other disorder: Secondary | ICD-10-CM | POA: Diagnosis not present

## 2017-06-24 DIAGNOSIS — I10 Essential (primary) hypertension: Secondary | ICD-10-CM | POA: Diagnosis not present

## 2017-06-24 DIAGNOSIS — G609 Hereditary and idiopathic neuropathy, unspecified: Secondary | ICD-10-CM | POA: Diagnosis not present

## 2017-06-24 DIAGNOSIS — R7303 Prediabetes: Secondary | ICD-10-CM | POA: Diagnosis not present

## 2017-06-24 DIAGNOSIS — Z0000 Encounter for general adult medical examination without abnormal findings: Secondary | ICD-10-CM | POA: Diagnosis not present

## 2017-06-24 DIAGNOSIS — K219 Gastro-esophageal reflux disease without esophagitis: Secondary | ICD-10-CM | POA: Diagnosis not present

## 2017-06-29 ENCOUNTER — Other Ambulatory Visit: Payer: Self-pay | Admitting: Family Medicine

## 2017-06-29 DIAGNOSIS — Z1231 Encounter for screening mammogram for malignant neoplasm of breast: Secondary | ICD-10-CM

## 2017-07-14 ENCOUNTER — Ambulatory Visit
Admission: RE | Admit: 2017-07-14 | Discharge: 2017-07-14 | Disposition: A | Discharge status: Home / Self Care | Payer: PPO | Source: Ambulatory Visit | Attending: Family Medicine | Admitting: Family Medicine

## 2017-07-14 DIAGNOSIS — Z1231 Encounter for screening mammogram for malignant neoplasm of breast: Secondary | ICD-10-CM

## 2017-07-16 DIAGNOSIS — R269 Unspecified abnormalities of gait and mobility: Secondary | ICD-10-CM | POA: Diagnosis not present

## 2017-07-16 DIAGNOSIS — M1712 Unilateral primary osteoarthritis, left knee: Secondary | ICD-10-CM | POA: Diagnosis not present

## 2017-07-16 DIAGNOSIS — G4733 Obstructive sleep apnea (adult) (pediatric): Secondary | ICD-10-CM | POA: Diagnosis not present

## 2017-10-19 DIAGNOSIS — G4733 Obstructive sleep apnea (adult) (pediatric): Secondary | ICD-10-CM | POA: Diagnosis not present

## 2017-10-19 DIAGNOSIS — R269 Unspecified abnormalities of gait and mobility: Secondary | ICD-10-CM | POA: Diagnosis not present

## 2017-10-19 DIAGNOSIS — M1712 Unilateral primary osteoarthritis, left knee: Secondary | ICD-10-CM | POA: Diagnosis not present

## 2017-10-26 DIAGNOSIS — N958 Other specified menopausal and perimenopausal disorders: Secondary | ICD-10-CM | POA: Diagnosis not present

## 2017-12-22 DIAGNOSIS — D229 Melanocytic nevi, unspecified: Secondary | ICD-10-CM | POA: Diagnosis not present

## 2017-12-22 DIAGNOSIS — I10 Essential (primary) hypertension: Secondary | ICD-10-CM | POA: Diagnosis not present

## 2017-12-22 DIAGNOSIS — E7800 Pure hypercholesterolemia, unspecified: Secondary | ICD-10-CM | POA: Diagnosis not present

## 2017-12-22 DIAGNOSIS — R739 Hyperglycemia, unspecified: Secondary | ICD-10-CM | POA: Diagnosis not present

## 2018-01-27 DIAGNOSIS — R269 Unspecified abnormalities of gait and mobility: Secondary | ICD-10-CM | POA: Diagnosis not present

## 2018-01-27 DIAGNOSIS — G4733 Obstructive sleep apnea (adult) (pediatric): Secondary | ICD-10-CM | POA: Diagnosis not present

## 2018-01-27 DIAGNOSIS — M1712 Unilateral primary osteoarthritis, left knee: Secondary | ICD-10-CM | POA: Diagnosis not present

## 2018-02-08 ENCOUNTER — Other Ambulatory Visit: Payer: Self-pay | Admitting: Family Medicine

## 2018-02-08 DIAGNOSIS — D229 Melanocytic nevi, unspecified: Secondary | ICD-10-CM | POA: Diagnosis not present

## 2018-02-08 DIAGNOSIS — L820 Inflamed seborrheic keratosis: Secondary | ICD-10-CM | POA: Diagnosis not present

## 2018-02-08 DIAGNOSIS — L821 Other seborrheic keratosis: Secondary | ICD-10-CM | POA: Diagnosis not present

## 2018-03-05 ENCOUNTER — Ambulatory Visit: Payer: PPO | Admitting: Cardiology

## 2018-05-12 NOTE — Progress Notes (Addendum)
Cardiology Office Note:    Date:  05/13/2018   ID:  Jessica Watts, DOB 1946-09-03, MRN [id number]  PCP:  Kelton Pillar, MD  Cardiologist:  No primary care provider on file.    Referring MD: Kelton Pillar, MD   Chief Complaint  Patient presents with  . Sleep Apnea  . Hypertension    History of Present Illness:    Jessica Watts is a 72 y.o. female with a hx of chest pain and shortness of breath with cath done for falls positive nuclear stress test showing normal coronary arteries and normal LVEDP.  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  She has chronic dyspnea on exertion with extreme walking.  She also has OSA with an AHI of 16.0/h and is on CPAP at 14 cm H2O.  At last office visit she was having problems with her mask and we switched her to an air fit P 20 mask with chinstrap and she is doing well with this.  She also has a history of hypertension.  She is here today for followup and is doing well.  She denies any  SOB, DOE, PND, orthopnea, LE edema, dizziness, palpitations or syncope.  She had one episode of atypical chest pain a few weeks ago when she was very stressed but has not had any episodes since then.  She works out at [employer] without any problems.  She is compliant with her meds and is tolerating meds with no SE.  She is doing well with her CPAP device.  She tolerates the mask and feels the pressure is adequate.  Since going on CPAP her feels rested in the am and has no significant daytime sleepiness.  She denies any significant mouth or nasal dryness or nasal congestion.  She does not think that he snores.      Past Medical History:  Diagnosis Date  . Arthritis   . Asthma   . Complication of anesthesia    has difficulties related to being Native American; multiple days in hospital after hysterectomy years ago  . COPD (chronic obstructive pulmonary disease) ([place])   . Depression    in the past  . Fibromyalgia   . GERD  (gastroesophageal reflux disease)   . Headache(784.0)    migraines  . Hyperlipidemia   . Hypertension   . Lupus ([place])   . Macular degeneration    left eye  . Migraine   . Neuropathy   . OSA (obstructive sleep apnea) 04/22/2016   Moderate with AHI 16/hr on CPAP  . Peptic ulcer disease   . Restless legs   . SVT (supraventricular tachycardia) ([place]) 12/03/2015   ablation for SVT    Past Surgical History:  Procedure Laterality Date  . ABDOMINAL HYSTERECTOMY    . ABLATION OF DYSRHYTHMIC FOCUS  12/03/2015   SVT  . ANTERIOR CERVICAL DECOMP/DISCECTOMY FUSION  07/20/2012   Procedure: ANTERIOR CERVICAL DECOMPRESSION/DISCECTOMY FUSION 2 LEVELS;  Surgeon: Charlie Pitter, MD;  Location: [facility];  Service: Neurosurgery;  Laterality: N/A;  cervical three- four , four -  five  . APPENDECTOMY    . BACK SURGERY     lumbar fusion  . CARDIAC CATHETERIZATION N/A 09/14/2015   Procedure: Left Heart Cath and Coronary Angiography;  Surgeon: Sherren Mocha, MD;  Location: [facility];  Service: Cardiovascular;  Laterality: N/A;  . CARPAL TUNNEL RELEASE     bilat  . CERVICAL FUSION  C2/3  . ELECTROPHYSIOLOGIC STUDY N/A 12/03/2015   Procedure: SVT Ablation;  Surgeon: Evans Lance, MD;  Location: [facility];  Service: Cardiovascular;  Laterality: N/A;  . EYE SURGERY Bilateral    cataract surgery with lens implant  . KNEE ARTHROSCOPY Left   . NEUROMA SURGERY     L foot  . SHOULDER SURGERY    . TONSILLECTOMY    . TOTAL KNEE ARTHROPLASTY Left 10/01/2016   Procedure: LEFT TOTAL KNEE ARTHROPLASTY;  Surgeon: Marybelle Killings, MD;  Location: [facility];  Service: Orthopedics;  Laterality: Left;  . ULNAR NERVE TRANSPOSITION  07/20/2012   Procedure: ULNAR NERVE DECOMPRESSION/TRANSPOSITION;  Surgeon: Charlie Pitter, MD;  Location: [facility];  Service: Neurosurgery;  Laterality: Left;    Current Medications: Current Meds  Medication Sig  . albuterol (PROAIR HFA) 108 (90 BASE) MCG/ACT inhaler  Inhale 2 puffs into the lungs every 4 (four) hours as needed for wheezing or shortness of breath.   Marland Kitchen aspirin EC 81 MG tablet Take 81 mg by mouth daily.  . calcium carbonate (OS-CAL - DOSED IN MG OF ELEMENTAL CALCIUM) 1250 (500 Ca) MG tablet Take 1 tablet by mouth daily.  . chlorpheniramine (CHLOR-TRIMETON) 4 MG tablet Take 4 mg by mouth daily.  . cholecalciferol (VITAMIN D) 1000 units tablet Take 1,000 Units by mouth daily.  . cycloSPORINE (RESTASIS) 0.05 % ophthalmic emulsion Place 1 drop into both eyes 2 (two) times daily as needed (dry eyes).  . fluticasone (FLONASE) 50 MCG/ACT nasal spray Place 1 spray into both nostrils daily as needed for allergies. For nasal congestion  . hydrochlorothiazide (HYDRODIURIL) 12.5 MG tablet Take 12.5 mg by mouth daily.  Marland Kitchen losartan (COZAAR) 100 MG tablet Take 100 mg by mouth daily.  Marland Kitchen omeprazole (PRILOSEC) 40 MG capsule Take 40 mg by mouth daily.  . potassium chloride SA (K-DUR,KLOR-CON) 20 MEQ tablet Take 20 mEq by mouth daily.     Allergies:   Ace inhibitors; Amoxicillin; and Sulfa antibiotics   Social History   Socioeconomic History  . Marital status: Divorced    Spouse name: Not on file  . Number of children: Not on file  . Years of education: Not on file  . Highest education level: Not on file  Occupational History  . Not on file  Social Needs  . Financial resource strain: Not on file  . Food insecurity:    Worry: Not on file    Inability: Not on file  . Transportation needs:    Medical: Not on file    Non-medical: Not on file  Tobacco Use  . Smoking status: Never Smoker  . Smokeless tobacco: Never Used  Substance and Sexual Activity  . Alcohol use: Yes    Comment: occasional wine  . Drug use: No  . Sexual activity: Not on file  Lifestyle  . Physical activity:    Days per week: Not on file    Minutes per session: Not on file  . Stress: Not on file  Relationships  . Social connections:    Talks on phone: Not on file    Gets  together: Not on file    Attends religious service: Not on file    Active member of club or organization: Not on file    Attends meetings of clubs or organizations: Not on file    Relationship status: Not on file  Other Topics Concern  . Not on file  Social History Narrative  . Not on file  Family History: The patient's family history includes Breast cancer in her sister and sister; Heart attack in her father and mother; Heart disease in her father and mother; Hypertension in her father and mother.  ROS:   Please see the history of present illness.    ROS  All other systems reviewed and negative.   EKGs/Labs/Other Studies Reviewed:    The following studies were reviewed today: PAP download  EKG:  EKG is not ordered today.    Recent Labs: No results found for requested labs within last 8760 hours.   Recent Lipid Panel No results found for: CHOL, TRIG, HDL, CHOLHDL, VLDL, LDLCALC, LDLDIRECT  Physical Exam:    VS:  BP (!) 148/80 (BP Location: Right Arm, Patient Position: Sitting, Cuff Size: Large)   Pulse 79   Ht 5' 0.5" (1.537 m)   Wt 219 lb 3.2 oz (99.4 kg)   SpO2 99%   BMI 42.10 kg/m     Wt Readings from Last 3 Encounters:  05/13/18 219 lb 3.2 oz (99.4 kg)  04/14/17 210 lb (95.3 kg)  02/26/17 211 lb 12.8 oz (96.1 kg)     GEN:  Well nourished, well developed in no acute distress HEENT: Normal NECK: No JVD; No carotid bruits LYMPHATICS: No lymphadenopathy CARDIAC: RRR, no murmurs, rubs, gallops RESPIRATORY:  Clear to auscultation without rales, wheezing or rhonchi  ABDOMEN: Soft, non-tender, non-distended MUSCULOSKELETAL:  No edema; No deformity  SKIN: Warm and dry NEUROLOGIC:  Alert and oriented x 3 PSYCHIATRIC:  Normal affect   ASSESSMENT:    1. SVT (supraventricular tachycardia) ([place])   2. Essential (primary) hypertension   3. OSA (obstructive sleep apnea)   4. Edema extremities    PLAN:    In order of problems listed above:  1.  SVT - s/p  remote AVNRT ablation in 2017.  She denies any further palpitations  2.  Hypertension -BP is well controlled on exam today.  She will continue on losartan HCT 100-12.5 mg daily.  3.  OSA - the patient is tolerating PAP therapy well without any problems. The PAP download was reviewed today and showed an AHI of 1/hr on 14 cm H2O with 93% compliance in using more than 4 hours nightly.  The patient has been using and benefiting from PAP use and will continue to benefit from therapy.   4.  LE edema - she says she occasionally has some swelling in her legs.  She does not follow a strict sodium diet and uses table salt.  I have encouraged her to avoid all table salt and told her that if she has swelling she can take an extra HCTZ when she needs to.     Medication Adjustments/Labs and Tests Ordered: Current medicines are reviewed at length with the patient today.  Concerns regarding medicines are outlined above.  No orders of the defined types were placed in this encounter.  No orders of the defined types were placed in this encounter.   Signed, Fransico Him, MD  05/13/2018 10:08 AM    [place]

## 2018-05-13 ENCOUNTER — Ambulatory Visit: Payer: PPO | Admitting: Cardiology

## 2018-05-13 ENCOUNTER — Encounter: Payer: Self-pay | Admitting: Cardiology

## 2018-05-13 VITALS — BP 148/80 | HR 79 | Ht 60.5 in | Wt 219.2 lb

## 2018-05-13 DIAGNOSIS — I10 Essential (primary) hypertension: Secondary | ICD-10-CM

## 2018-05-13 DIAGNOSIS — R600 Localized edema: Secondary | ICD-10-CM | POA: Diagnosis not present

## 2018-05-13 DIAGNOSIS — I471 Supraventricular tachycardia: Secondary | ICD-10-CM | POA: Diagnosis not present

## 2018-05-13 DIAGNOSIS — G4733 Obstructive sleep apnea (adult) (pediatric): Secondary | ICD-10-CM | POA: Diagnosis not present

## 2018-05-13 NOTE — Patient Instructions (Signed)
Your physician recommends that you continue on your current medications as directed. Please refer to the Current Medication list given to you today.  Your physician wants you to follow-up in: 1 year with Dr. Turner. You will receive a reminder letter in the mail two months in advance. If you don't receive a letter, please call our office to schedule the follow-up appointment.  

## 2018-06-10 ENCOUNTER — Ambulatory Visit (INDEPENDENT_AMBULATORY_CARE_PROVIDER_SITE_OTHER): Payer: PPO | Admitting: Surgery

## 2018-06-10 ENCOUNTER — Ambulatory Visit (INDEPENDENT_AMBULATORY_CARE_PROVIDER_SITE_OTHER): Payer: PPO

## 2018-06-10 ENCOUNTER — Encounter (INDEPENDENT_AMBULATORY_CARE_PROVIDER_SITE_OTHER): Payer: Self-pay | Admitting: Surgery

## 2018-06-10 VITALS — BP 166/80 | HR 78 | Ht 60.5 in | Wt 215.0 lb

## 2018-06-10 DIAGNOSIS — M7061 Trochanteric bursitis, right hip: Secondary | ICD-10-CM | POA: Diagnosis not present

## 2018-06-10 DIAGNOSIS — M7062 Trochanteric bursitis, left hip: Secondary | ICD-10-CM

## 2018-06-10 DIAGNOSIS — M4316 Spondylolisthesis, lumbar region: Secondary | ICD-10-CM

## 2018-06-10 DIAGNOSIS — M545 Low back pain: Secondary | ICD-10-CM | POA: Diagnosis not present

## 2018-06-10 MED ORDER — METHYLPREDNISOLONE 4 MG PO TABS: ORAL_TABLET | ORAL | 0 refills | Status: DC

## 2018-06-10 MED ORDER — METHOCARBAMOL 500 MG PO TABS: 500.0000 mg | ORAL_TABLET | Freq: Three times a day (TID) | ORAL | 0 refills | Status: DC | PRN

## 2018-06-10 NOTE — Progress Notes (Signed)
Office Visit Note   Patient: Jessica Watts           Date of Birth: [birth year]           MRN: [id number] Visit Date: 06/10/2018              Requested by: Kelton Pillar, MD [place] [address] [employer] [address] [place], [place] [address] PCP: Kelton Pillar, MD   Assessment & Plan: Visit Diagnoses:  1. Low back pain, unspecified back pain laterality, unspecified chronicity, with sciatica presence unspecified   2. Spondylolisthesis of lumbar region   3. Greater trochanteric bursitis of both hips   Bilateral SI joint pain  Plan: With patient's ongoing and progressively worsening symptoms I will get lumbar MRI with and without contrast and compared to previous study that was done May 09, 2015.  Follow with Dr. Lorin Mercy after completion in about 3 weeks to review results and discuss further treatment options.  Given prescriptions for Medrol Dosepak 6-day taper to be taken as directed and Robaxin 500 mg 1 tab p.o. every 8 hours as needed for spasms.  Patient denies history of diabetes but it is documented in her chart that she has borderline.  No medication management.  We did discuss possible conservative treatment with greater trochanter bursa and possible SI joint injections if these areas continue to be bothersome after prednisone taper.  Follow-Up Instructions: Return in about 3 weeks (around 07/01/2018) for With Dr. Lorin Mercy to review lumbar MRI.   Orders:  Orders Placed This Encounter  Procedures  . XR Lumbar Spine 2-3 Views  . MR Lumbar Spine W Wo Contrast   Meds ordered this encounter  Medications  . methylPREDNISolone (MEDROL) 4 MG tablet    Sig: 6-day taper to be taken as directed    Dispense:  21 tablet    Refill:  0  . methocarbamol (ROBAXIN) 500 MG tablet    Sig: Take 1 tablet (500 mg total) by mouth every 8 (eight) hours as needed for muscle spasms.    Dispense:  40 tablet    Refill:  0      Procedures: No procedures performed   Clinical Data: No additional  findings.   Subjective: Chief Complaint  Patient presents with  . Lower Back - Pain    HPI 72 year old white female comes in with complaints of worsening low back pain, bilateral lateral hip pain and bilateral leg "burning and stinging".  Patient has known history of multilevel lumbar spondylosis and had previous lumbar MRI May 09, 2015.  That scan showed:  EXAM: MRI LUMBAR SPINE WITHOUT CONTRAST  TECHNIQUE: Multiplanar, multisequence MR imaging of the lumbar spine was performed. No intravenous contrast was administered.  COMPARISON:  None.  FINDINGS: Normal signal is present in the conus medullaris which terminates at L1-2. Chronic endplate marrow changes are noted at L3-4 and L4-5, worse on the right. Levoconvex curvature of the lumbar spine is centered at L4. Slight anterolisthesis is present at L3-4 and L4-5.  Limited imaging of the abdomen is unremarkable. There is no significant adenopathy.  L1-2: The negative.  L2-3: A broad-based disc protrusion is present. Mild facet hypertrophy is noted bilaterally. This leads to mild subarticular and foraminal narrowing bilaterally, worse on the left.  L3-4: A broad-based disc protrusion is asymmetric to the left. Moderate facet hypertrophy is noted bilaterally. Mild left subarticular stenosis is present. Mild foraminal narrowing is present bilaterally.  L4-5: A broad-based disc protrusion is present. Moderate facet hypertrophy is worse on the  right. Mild to moderate subarticular and foraminal stenosis is present bilaterally.  L5-S1: Slight disc bulging is present. There is no significant stenosis.  IMPRESSION: 1. Levoconvex curvature of the lumbar spine is centered at L4 with chronic endplate marrow changes at L3-4 and L4-5, worse on the right. 2. Mild subarticular and foraminal narrowing bilaterally at L2-3 is worse on the left. 3. Mild left subarticular and bilateral foraminal stenosis at L3-4. 4. Mild  to moderate subarticular and foraminal stenosis bilaterally at L4-5. Facet hypertrophy is worse on the right. 5. Mild disc bulging without significant stenosis at L5-S1.  Patient had prior lumbar surgery in 2007 by another physician.  She states that she is continued to have worsening low back pain with bilateral lower extreme radiculopathy over the last 6 months.  Symptoms aggravated with laying down, sitting and standing in one place for too long.  She is not really describing any neurogenic claudication symptoms when she walks around a grocery store.  She is also complaining of pain over the bilateral lateral hips.  Pain with ambulation.  Denies bowel or bladder incontinence.   Review of Systems No current cardiac pulmonary GI GU issues  Objective: Vital Signs: BP (!) 166/80 (BP Location: Left Arm, Patient Position: Sitting)   Pulse 78   Ht 5' 0.5" (1.537 m)   Wt 215 lb (97.5 kg)   BMI 41.30 kg/m   Physical Exam  Constitutional: She is oriented to person, place, and time. She appears well-nourished. No distress.  HENT:  Head: Atraumatic.  Eyes: Pupils are equal, round, and reactive to light. EOM are normal.  Pulmonary/Chest: No respiratory distress.  Abdominal: She exhibits no distension.  Musculoskeletal:  Gait is somewhat antalgic.  She has bilateral lumbar paraspinal tenderness.  Tender of the bilateral SI joints.  Marked tenderness over the bilateral hip greater trochanter bursa.  Negative logroll.  Negative straight leg raise.  Neurovascular intact.  No focal motor deficits.  Neurological: She is oriented to person, place, and time.  Skin: Skin is warm and dry.  Psychiatric: She has a normal mood and affect.    Ortho Exam  Specialty Comments:  No specialty comments available.  Imaging: No results found.   PMFS History: Patient Active Problem List   Diagnosis Date Noted  . Edema extremities 05/13/2018  . Presence of left artificial knee joint 04/14/2017  . Other  intervertebral disc degeneration, lumbar region 04/14/2017  . Lumbar foraminal stenosis 04/14/2017  . Arthritis of left knee 10/01/2016  . OSA (obstructive sleep apnea) 04/22/2016  . SVT (supraventricular tachycardia) ([place]) 11/08/2015  . SOB (shortness of breath) 08/23/2015  . Allergic rhinitis 08/22/2015  . Absolute anemia 08/22/2015  . Essential (primary) hypertension 08/22/2015  . Gastro-esophageal reflux disease without esophagitis 08/22/2015  . Hereditary and idiopathic neuropathy 08/22/2015  . Migraine with typical aura 08/22/2015  . Unilateral primary osteoarthritis, left knee 08/22/2015  . Borderline diabetes mellitus 08/22/2015  . Pure hypercholesterolemia 08/22/2015  . Systemic sclerosis ([place]) 08/22/2015  . Fast heart beat 08/22/2015  . Absence of bladder continence 08/22/2015  . Apnea, sleep 07/16/2015  . Cataract 09/26/2013  . Cataract of left eye 08/29/2013  . Cervical radiculopathy 07/20/2012  . Ulnar neuropathy at elbow 07/20/2012   Past Medical History:  Diagnosis Date  . Arthritis   . Asthma   . Complication of anesthesia    has difficulties related to being Native American; multiple days in hospital after hysterectomy years ago  . COPD (chronic obstructive pulmonary disease) ([place])   .  Depression    in the past  . Fibromyalgia   . GERD (gastroesophageal reflux disease)   . Headache(784.0)    migraines  . Hyperlipidemia   . Hypertension   . Lupus ([place])   . Macular degeneration    left eye  . Migraine   . Neuropathy   . OSA (obstructive sleep apnea) 04/22/2016   Moderate with AHI 16/hr on CPAP  . Peptic ulcer disease   . Restless legs   . SVT (supraventricular tachycardia) ([place]) 12/03/2015   ablation for SVT    Family History  Problem Relation Age of Onset  . Heart disease Mother   . Heart attack Mother   . Hypertension Mother   . Heart attack Father   . Heart disease Father   . Hypertension Father   . Breast cancer Sister   . Breast cancer  Sister     Past Surgical History:  Procedure Laterality Date  . ABDOMINAL HYSTERECTOMY    . ABLATION OF DYSRHYTHMIC FOCUS  12/03/2015   SVT  . ANTERIOR CERVICAL DECOMP/DISCECTOMY FUSION  07/20/2012   Procedure: ANTERIOR CERVICAL DECOMPRESSION/DISCECTOMY FUSION 2 LEVELS;  Surgeon: Charlie Pitter, MD;  Location: [facility];  Service: Neurosurgery;  Laterality: N/A;  cervical three- four , four -  five  . APPENDECTOMY    . BACK SURGERY     lumbar fusion  . CARDIAC CATHETERIZATION N/A 09/14/2015   Procedure: Left Heart Cath and Coronary Angiography;  Surgeon: Sherren Mocha, MD;  Location: [facility];  Service: Cardiovascular;  Laterality: N/A;  . CARPAL TUNNEL RELEASE     bilat  . CERVICAL FUSION     C2/3  . ELECTROPHYSIOLOGIC STUDY N/A 12/03/2015   Procedure: SVT Ablation;  Surgeon: Evans Lance, MD;  Location: [facility];  Service: Cardiovascular;  Laterality: N/A;  . EYE SURGERY Bilateral    cataract surgery with lens implant  . KNEE ARTHROSCOPY Left   . NEUROMA SURGERY     L foot  . SHOULDER SURGERY    . TONSILLECTOMY    . TOTAL KNEE ARTHROPLASTY Left 10/01/2016   Procedure: LEFT TOTAL KNEE ARTHROPLASTY;  Surgeon: Marybelle Killings, MD;  Location: [facility];  Service: Orthopedics;  Laterality: Left;  . ULNAR NERVE TRANSPOSITION  07/20/2012   Procedure: ULNAR NERVE DECOMPRESSION/TRANSPOSITION;  Surgeon: Charlie Pitter, MD;  Location: [facility];  Service: Neurosurgery;  Laterality: Left;   Social History   Occupational History  . Not on file  Tobacco Use  . Smoking status: Never Smoker  . Smokeless tobacco: Never Used  Substance and Sexual Activity  . Alcohol use: Yes    Comment: occasional wine  . Drug use: No  . Sexual activity: Not on file

## 2018-06-27 ENCOUNTER — Ambulatory Visit
Admission: RE | Admit: 2018-06-27 | Discharge: 2018-06-27 | Disposition: A | Discharge status: Home / Self Care | Payer: PPO | Source: Ambulatory Visit | Attending: Surgery | Admitting: Surgery

## 2018-06-27 DIAGNOSIS — M4316 Spondylolisthesis, lumbar region: Secondary | ICD-10-CM

## 2018-06-27 DIAGNOSIS — M5116 Intervertebral disc disorders with radiculopathy, lumbar region: Secondary | ICD-10-CM | POA: Diagnosis not present

## 2018-06-27 DIAGNOSIS — M4726 Other spondylosis with radiculopathy, lumbar region: Secondary | ICD-10-CM | POA: Diagnosis not present

## 2018-06-27 MED ORDER — GADOBENATE DIMEGLUMINE 529 MG/ML IV SOLN
20.0000 mL | Freq: Once | INTRAVENOUS | Status: AC | PRN
  Administered 2018-06-27: 20 mL via INTRAVENOUS

## 2018-06-30 ENCOUNTER — Ambulatory Visit (INDEPENDENT_AMBULATORY_CARE_PROVIDER_SITE_OTHER): Payer: PPO | Admitting: Orthopaedic Surgery

## 2018-06-30 ENCOUNTER — Encounter (INDEPENDENT_AMBULATORY_CARE_PROVIDER_SITE_OTHER): Payer: Self-pay | Admitting: Orthopaedic Surgery

## 2018-06-30 VITALS — BP 176/98 | HR 75 | Ht 60.5 in | Wt 215.0 lb

## 2018-06-30 DIAGNOSIS — M545 Low back pain: Secondary | ICD-10-CM

## 2018-06-30 DIAGNOSIS — M4316 Spondylolisthesis, lumbar region: Secondary | ICD-10-CM

## 2018-06-30 NOTE — Progress Notes (Signed)
Office Visit Note   Patient: Jessica Watts           Date of Birth: [birth year]           MRN: [id number] Visit Date: 06/30/2018              Requested by: Kelton Pillar, MD [place] [address] [place], [place] [address] PCP: Kelton Pillar, MD   Assessment & Plan: Visit Diagnoses:  1. Low back pain, unspecified back pain laterality, unspecified chronicity, with sciatica presence unspecified   2. Spondylolisthesis of lumbar region   3. Morbid (severe) obesity due to excess calories ([place])     Plan: We will refer her to Dr. Ernestina Patches for L2-3 epidural.  She got some good relief with the Medrol Dosepak.  We discussed weight loss with dieting and increased activity with her BMI greater than 40.  She can return in 4 weeks.  Follow-Up Instructions: Return in about 4 weeks (around 07/28/2018).   Orders:  Orders Placed This Encounter  Procedures  . Ambulatory referral to Physical Medicine Rehab   No orders of the defined types were placed in this encounter.     Procedures: No procedures performed   Clinical Data: No additional findings.   Subjective: Chief Complaint  Patient presents with  . Lower Back - Pain, Follow-up    MRI Lumbar Review    HPI 72 year old female returns with 43-month history of back and bilateral leg pain.  She states she got good relief with the Medrol Dosepak short-term and pain returned a few days after she completed the medication.  She states the Robaxin was not covered by her insurance.  MRI scan has been obtained and available for review for comparison to 2016 MRI.  This shows progressive degeneration at L2-3 with mild multifactorial canal and lateral recess stenosis.  Disc degeneration with chronic left extraforaminal disc protrusion unchanged from 2016.  Chronic disc degeneration at L4-5 without compressive stenosis.  Review of Systems 14 point review of systems positive for knee arthritis lumbar disc degeneration, cervical  radiculopathy.  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  SVT ablation.  Otherwise negative as it pertains HPI.   Objective: Vital Signs: BP (!) 176/98   Pulse 75   Ht 5' 0.5" (1.537 m)   Wt 215 lb (97.5 kg)   BMI 41.30 kg/m   Physical Exam  Constitutional: She is oriented to person, place, and time. She appears well-developed.  HENT:  Head: Normocephalic.  Right Ear: External ear normal.  Left Ear: External ear normal.  Eyes: Pupils are equal, round, and reactive to light.  Neck: No tracheal deviation present. No thyromegaly present.  Cardiovascular: Normal rate.  Pulmonary/Chest: Effort normal.  Abdominal: Soft.  Neurological: She is alert and oriented to person, place, and time.  Skin: Skin is warm and dry.  Psychiatric: She has a normal mood and affect. Her behavior is normal.    Ortho Exam well-healed lumbar incision without erythema.  Mild tenderness in the midline.  Mild sciatic notch tenderness right and left.  Negative straight leg raising 90 degrees.  She lacks a few degrees reaching full extension on her total knee arthroplasty side.  Normal heel toe gait.  She has near symmetrical quad strength.  Specialty Comments:  No specialty comments available.  Imaging: CLINICAL DATA:  Low back pain radiating to both legs, 8 months duration.  EXAM:  MRI LUMBAR SPINE WITHOUT AND WITH CONTRAST  TECHNIQUE: Multiplanar and multiecho pulse sequences of the lumbar spine were obtained without and with intravenous contrast.  CONTRAST:  91mL MULTIHANCE GADOBENATE DIMEGLUMINE 529 MG/ML IV SOLN  COMPARISON:  Radiography 06/10/2018.  MRI 05/09/2015.  FINDINGS: Segmentation:  5 lumbar type vertebral bodies.  Alignment: 2 mm retrolisthesis L2-3. 2 mm anterolisthesis L3-4. 2 mm anterolisthesis L4-5.  Vertebrae:  No fracture or primary bone  lesion.  Conus medullaris and cauda equina: Conus extends to the L1-2 level. Conus and cauda equina appear normal.  Paraspinal and other soft tissues: Negative  Disc levels:  No significant finding at T11-12, T12-L1 or L1-2.  L2-3: 2 mm retrolisthesis. Disc degeneration more pronounced on the left. Endplate osteophytes and bulging of the disc. Facet and ligamentous hypertrophy. Mild multifactorial stenosis of the canal and lateral recesses. Foraminal narrowing on the left.  L3-4: 2 mm anterolisthesis. Bilateral facet osteoarthritis. Bulging of the disc with a small left extraforaminal disc herniation that could affect the left L3 nerve.  L4-5: 2 mm anterolisthesis. Mild bulging of the disc. Mild facet and ligamentous hypertrophy. Mild narrowing of the lateral recesses without visible neural compression.  L5-S1: Mild facet osteoarthritis. No canal or foraminal stenosis. The  Since the study of 2016, the only changes at L2-3 where there is a progressive degeneration of the disc with loss of height. Left foraminal to extraforaminal disc material seen at that time as involuted somewhat, but there is slight worsening of foraminal stenosis.  IMPRESSION: L2-3: Progressive disc degeneration since 2016. Mild multifactorial canal and lateral recess stenosis. Foraminal narrowing on the left that could affect the exiting L2 nerve.  L3-4: Disc degeneration and facet degeneration. Chronic left extraforaminal disc protrusion. No apparent change since 2016.  L4-5: Chronic disc degeneration and facet degeneration. No compressive stenosis.  L5-S1: Mild facet osteoarthritis.   Electronically Signed   By: Nelson Chimes M.D.   On: 06/27/2018 21:06    PMFS History: Patient Active Problem List   Diagnosis Date Noted  . Spondylolisthesis of lumbar region 07/02/2018  . Edema extremities 05/13/2018  . Presence of left artificial knee joint 04/14/2017  . Other  intervertebral disc degeneration, lumbar region 04/14/2017  . Lumbar foraminal stenosis 04/14/2017  . Arthritis of left knee 10/01/2016  . OSA (obstructive sleep apnea) 04/22/2016  . SVT (supraventricular tachycardia) ([place]) 11/08/2015  . SOB (shortness of breath) 08/23/2015  . Allergic rhinitis 08/22/2015  . Absolute anemia 08/22/2015  . Essential (primary) hypertension 08/22/2015  . Gastro-esophageal reflux disease without esophagitis 08/22/2015  . Hereditary and idiopathic neuropathy 08/22/2015  . Migraine with typical aura 08/22/2015  . Unilateral primary osteoarthritis, left knee 08/22/2015  . Borderline diabetes mellitus 08/22/2015  . Pure hypercholesterolemia 08/22/2015  . Systemic sclerosis ([place]) 08/22/2015  . Fast heart beat 08/22/2015  . Absence of bladder continence 08/22/2015  . Apnea, sleep 07/16/2015  . Cataract 09/26/2013  . Cataract of left eye 08/29/2013  . Cervical radiculopathy 07/20/2012  . Ulnar neuropathy at elbow 07/20/2012   Past Medical History:  Diagnosis Date  . Arthritis   . Asthma   . Complication of anesthesia    has difficulties related to being Native American; multiple days in hospital after hysterectomy years ago  . COPD (chronic obstructive pulmonary disease) ([place])   . Depression    in the past  . Fibromyalgia   . GERD (gastroesophageal reflux disease)   . Headache(784.0)    migraines  . Hyperlipidemia   . Hypertension   .  Lupus ([place])   . Macular degeneration    left eye  . Migraine   . Neuropathy   . OSA (obstructive sleep apnea) 04/22/2016   Moderate with AHI 16/hr on CPAP  . Peptic ulcer disease   . Restless legs   . SVT (supraventricular tachycardia) ([place]) 12/03/2015   ablation for SVT    Family History  Problem Relation Age of Onset  . Heart disease Mother   . Heart attack Mother   . Hypertension Mother   . Heart attack Father   . Heart disease Father   . Hypertension Father   . Breast cancer Sister   . Breast cancer  Sister     Past Surgical History:  Procedure Laterality Date  . ABDOMINAL HYSTERECTOMY    . ABLATION OF DYSRHYTHMIC FOCUS  12/03/2015   SVT  . ANTERIOR CERVICAL DECOMP/DISCECTOMY FUSION  07/20/2012   Procedure: ANTERIOR CERVICAL DECOMPRESSION/DISCECTOMY FUSION 2 LEVELS;  Surgeon: Charlie Pitter, MD;  Location: [facility];  Service: Neurosurgery;  Laterality: N/A;  cervical three- four , four -  five  . APPENDECTOMY    . BACK SURGERY     lumbar fusion  . CARDIAC CATHETERIZATION N/A 09/14/2015   Procedure: Left Heart Cath and Coronary Angiography;  Surgeon: Sherren Mocha, MD;  Location: [facility];  Service: Cardiovascular;  Laterality: N/A;  . CARPAL TUNNEL RELEASE     bilat  . CERVICAL FUSION     C2/3  . ELECTROPHYSIOLOGIC STUDY N/A 12/03/2015   Procedure: SVT Ablation;  Surgeon: Evans Lance, MD;  Location: [facility];  Service: Cardiovascular;  Laterality: N/A;  . EYE SURGERY Bilateral    cataract surgery with lens implant  . KNEE ARTHROSCOPY Left   . NEUROMA SURGERY     L foot  . SHOULDER SURGERY    . TONSILLECTOMY    . TOTAL KNEE ARTHROPLASTY Left 10/01/2016   Procedure: LEFT TOTAL KNEE ARTHROPLASTY;  Surgeon: Marybelle Killings, MD;  Location: [facility];  Service: Orthopedics;  Laterality: Left;  . ULNAR NERVE TRANSPOSITION  07/20/2012   Procedure: ULNAR NERVE DECOMPRESSION/TRANSPOSITION;  Surgeon: Charlie Pitter, MD;  Location: [facility];  Service: Neurosurgery;  Laterality: Left;   Social History   Occupational History  . Not on file  Tobacco Use  . Smoking status: Never Smoker  . Smokeless tobacco: Never Used  Substance and Sexual Activity  . Alcohol use: Yes    Comment: occasional wine  . Drug use: No  . Sexual activity: Not on file

## 2018-07-02 ENCOUNTER — Encounter (INDEPENDENT_AMBULATORY_CARE_PROVIDER_SITE_OTHER): Payer: Self-pay | Admitting: Orthopaedic Surgery

## 2018-07-02 DIAGNOSIS — M545 Low back pain, unspecified: Secondary | ICD-10-CM | POA: Insufficient documentation

## 2018-07-02 DIAGNOSIS — M4316 Spondylolisthesis, lumbar region: Secondary | ICD-10-CM | POA: Insufficient documentation

## 2018-07-15 ENCOUNTER — Encounter (INDEPENDENT_AMBULATORY_CARE_PROVIDER_SITE_OTHER): Payer: Self-pay | Admitting: Physical Medicine and Rehabilitation

## 2018-07-15 ENCOUNTER — Ambulatory Visit (INDEPENDENT_AMBULATORY_CARE_PROVIDER_SITE_OTHER): Payer: PPO | Admitting: Physical Medicine and Rehabilitation

## 2018-07-15 ENCOUNTER — Ambulatory Visit (INDEPENDENT_AMBULATORY_CARE_PROVIDER_SITE_OTHER): Payer: Self-pay

## 2018-07-15 ENCOUNTER — Encounter

## 2018-07-15 VITALS — BP 181/90 | HR 86 | Temp 98.5°F

## 2018-07-15 DIAGNOSIS — M4316 Spondylolisthesis, lumbar region: Secondary | ICD-10-CM

## 2018-07-15 DIAGNOSIS — G8929 Other chronic pain: Secondary | ICD-10-CM | POA: Diagnosis not present

## 2018-07-15 DIAGNOSIS — M47816 Spondylosis without myelopathy or radiculopathy, lumbar region: Secondary | ICD-10-CM

## 2018-07-15 DIAGNOSIS — M5416 Radiculopathy, lumbar region: Secondary | ICD-10-CM | POA: Diagnosis not present

## 2018-07-15 DIAGNOSIS — M5441 Lumbago with sciatica, right side: Secondary | ICD-10-CM

## 2018-07-15 DIAGNOSIS — M5442 Lumbago with sciatica, left side: Secondary | ICD-10-CM | POA: Diagnosis not present

## 2018-07-15 MED ORDER — METHYLPREDNISOLONE ACETATE 80 MG/ML IJ SUSP
80.0000 mg | Freq: Once | INTRAMUSCULAR | Status: AC
  Administered 2018-07-15: 80 mg

## 2018-07-15 NOTE — Progress Notes (Signed)
Pt states pain in lower back that radiates into both legs. Pt states pain started to get worse the past two months. Pt states turning and twisting makes pain worse, elevating legs makes pain better.   .Numeric Pain Rating Scale and Functional Assessment Average Pain 6   In the last MONTH (on 0-10 scale) has pain interfered with the following?  1. General activity like being  able to carry out your everyday physical activities such as walking, climbing stairs, carrying groceries, or moving a chair?  Rating(4)   +Driver, -BT, -Dye Allergies.

## 2018-07-15 NOTE — Patient Instructions (Signed)

## 2018-07-23 NOTE — Procedures (Signed)
Lumbar Epidural Steroid Injection - Interlaminar Approach with Fluoroscopic Guidance  Patient: Jessica Watts      Date of Birth: 12-19-1945 MRN: [id number] PCP: Kelton Pillar, MD      Visit Date: 07/15/2018   Universal Protocol:     Consent Given By: the patient  Position: PRONE  Additional Comments: Vital signs were monitored before and after the procedure. Patient was prepped and draped in the usual sterile fashion. The correct patient, procedure, and site was verified.   Injection Procedure Details:  Procedure Site One Meds Administered:  Meds ordered this encounter  Medications  . methylPREDNISolone acetate (DEPO-MEDROL) injection 80 mg     Laterality: Left  Location/Site:  L2-L3  Needle size: 20 G  Needle type: Tuohy  Needle Placement: Paramedian epidural  Findings:   -Comments: Excellent flow of contrast into the epidural space.  Procedure Details: Using a paramedian approach from the side mentioned above, the region overlying the inferior lamina was localized under fluoroscopic visualization and the soft tissues overlying this structure were infiltrated with 4 ml. of 1% Lidocaine without Epinephrine. The Tuohy needle was inserted into the epidural space using a paramedian approach.   The epidural space was localized using loss of resistance along with lateral and bi-planar fluoroscopic views.  After negative aspirate for air, blood, and CSF, a 2 ml. volume of Isovue-250 was injected into the epidural space and the flow of contrast was observed. Radiographs were obtained for documentation purposes.    The injectate was administered into the level noted above.   Additional Comments:  The patient tolerated the procedure well Dressing: Band-Aid    Post-procedure details: Patient was observed during the procedure. Post-procedure instructions were reviewed.  Patient left the clinic in stable condition.

## 2018-07-23 NOTE — Progress Notes (Signed)
Jessica Watts - 72 y.o. female MRN [id number]  Date of birth: October 12, 1946  Office Visit Note: Visit Date: 07/15/2018 PCP: Kelton Pillar, MD Referred by: Kelton Pillar, MD  Subjective: Chief Complaint  Patient presents with  . Lower Back - Pain  . Right Leg - Pain  . Left Leg - Pain   HPI:  Jessica Watts is a 72 y.o. female who comes in today With chronic worsening low back pain that refers to both hips and legs.  She has been seen and evaluated by Dr. Rodell Perna and is failed conservative care.  MRI was obtained and this is reviewed below.  Patient mainly has degenerative facet arthropathy at multiple levels with some listhesis anterior and retrolisthesis and lateral recess stenosis but without frank nerve compression or central canal narrowing.  Because of the patient's bilateral leg pain we will complete epidural injection diagnostically at L2-3.  Would consider facet joint block.  She will continue follow-up with Dr. Lorin Mercy.  Patient had significant blood pressure today 181/90 and this was discussed with the patient.  ROS Otherwise per HPI.  Assessment & Plan: Visit Diagnoses:  1. Lumbar radiculopathy   2. Spondylosis without myelopathy or radiculopathy, lumbar region   3. Spondylolisthesis of lumbar region   4. Chronic bilateral low back pain with bilateral sciatica     Plan: No additional findings.   Meds & Orders:  Meds ordered this encounter  Medications  . methylPREDNISolone acetate (DEPO-MEDROL) injection 80 mg    Orders Placed This Encounter  Procedures  . XR C-ARM NO REPORT  . Epidural Steroid injection    Follow-up: Return for Rodell Perna, MD as scheduled.   Procedures: No procedures performed  No notes on file   Clinical History: MRI LUMBAR SPINE WITHOUT AND WITH CONTRAST  TECHNIQUE: Multiplanar and multiecho pulse sequences of the lumbar spine were obtained without and with intravenous contrast.  CONTRAST:  25mL MULTIHANCE GADOBENATE  DIMEGLUMINE 529 MG/ML IV SOLN  COMPARISON:  Radiography 06/10/2018.  MRI 05/09/2015.  FINDINGS: Segmentation:  5 lumbar type vertebral bodies.  Alignment: 2 mm retrolisthesis L2-3. 2 mm anterolisthesis L3-4. 2 mm anterolisthesis L4-5.  Vertebrae:  No fracture or primary bone lesion.  Conus medullaris and cauda equina: Conus extends to the L1-2 level. Conus and cauda equina appear normal.  Paraspinal and other soft tissues: Negative  Disc levels:  No significant finding at T11-12, T12-L1 or L1-2.  L2-3: 2 mm retrolisthesis. Disc degeneration more pronounced on the left. Endplate osteophytes and bulging of the disc. Facet and ligamentous hypertrophy. Mild multifactorial stenosis of the canal and lateral recesses. Foraminal narrowing on the left.  L3-4: 2 mm anterolisthesis. Bilateral facet osteoarthritis. Bulging of the disc with a small left extraforaminal disc herniation that could affect the left L3 nerve.  L4-5: 2 mm anterolisthesis. Mild bulging of the disc. Mild facet and ligamentous hypertrophy. Mild narrowing of the lateral recesses without visible neural compression.  L5-S1: Mild facet osteoarthritis. No canal or foraminal stenosis. The  Since the study of 2016, the only changes at L2-3 where there is a progressive degeneration of the disc with loss of height. Left foraminal to extraforaminal disc material seen at that time as involuted somewhat, but there is slight worsening of foraminal stenosis.  IMPRESSION: L2-3: Progressive disc degeneration since 2016. Mild multifactorial canal and lateral recess stenosis. Foraminal narrowing on the left that could affect the exiting L2 nerve.  L3-4: Disc degeneration and facet degeneration. Chronic left extraforaminal disc protrusion. No  apparent change since 2016.  L4-5: Chronic disc degeneration and facet degeneration. No compressive stenosis.  L5-S1: Mild facet  osteoarthritis.   Electronically Signed   By: Nelson Chimes M.D.   On: 06/27/2018 21:06     Objective:  VS:  HT:    WT:   BMI:     BP:(!) 181/90  HR:86bpm  TEMP:98.5 F (36.9 C)(Oral)  RESP:  Physical Exam  Ortho Exam Imaging: No results found.

## 2018-07-28 ENCOUNTER — Ambulatory Visit (INDEPENDENT_AMBULATORY_CARE_PROVIDER_SITE_OTHER): Payer: PPO | Admitting: Orthopaedic Surgery

## 2018-07-28 ENCOUNTER — Encounter (INDEPENDENT_AMBULATORY_CARE_PROVIDER_SITE_OTHER): Payer: Self-pay | Admitting: Orthopaedic Surgery

## 2018-07-28 VITALS — Ht 60.5 in | Wt 215.0 lb

## 2018-07-28 DIAGNOSIS — M48061 Spinal stenosis, lumbar region without neurogenic claudication: Secondary | ICD-10-CM | POA: Diagnosis not present

## 2018-07-28 DIAGNOSIS — M5136 Other intervertebral disc degeneration, lumbar region: Secondary | ICD-10-CM | POA: Diagnosis not present

## 2018-07-28 NOTE — Progress Notes (Signed)
Office Visit Note   Patient: Jessica Watts           Date of Birth: September 23, 1946           MRN: [id number] Visit Date: 07/28/2018              Requested by: Kelton Pillar, MD [place] [employer] [address] [place], [place] [address] PCP: Kelton Pillar, MD   Assessment & Plan: Visit Diagnoses:  1. Other intervertebral disc degeneration, lumbar region   2. Lumbar foraminal stenosis     Plan: Patient's activities improved greater than 50% relief with epidural.  She has recurrent symptoms she will call about a second epidural.  She is happy with the improvement in his back doing her walking program and work on some weight loss.  Pathophysiology discussed and treatment options were discussed in detail.  Follow-Up Instructions: Return if symptoms worsen or fail to improve.   Orders:  No orders of the defined types were placed in this encounter.  No orders of the defined types were placed in this encounter.     Procedures: No procedures performed   Clinical Data: No additional findings.   Subjective: Chief Complaint  Patient presents with  . Lower Back - Pain, Follow-up    07/15/18 Lumbar ESI    HPI 72 year old female returns post epidural injection 07/15/2018 for left extraforaminal disc protrusion and lateral recess stenosis at L2-3.  She is had some persistent extraforaminal protrusion since 2016.  She does have some disc degeneration L4-5 without compression.  Epidural was performed on 07/15/2018 and she states she is significantly improved more than 50%.  She is been able to get back to her walking program and stretching program.  Patient states she is finally been able to sleep through the night does not wake up with back and leg pain.  Review of Systems 14 point update unchanged from 06/30/2018 office visit other than as mentioned in HPI.  She has had previous cervical fusion 2013 and left total knee arthroplasty 2017.   Objective: Vital Signs: Ht 5' 0.5" (1.537 m)    Wt 215 lb (97.5 kg)   BMI 41.30 kg/m   Physical Exam  Constitutional: She is oriented to person, place, and time. She appears well-developed.  HENT:  Head: Normocephalic.  Right Ear: External ear normal.  Left Ear: External ear normal.  Eyes: Pupils are equal, round, and reactive to light.  Neck: No tracheal deviation present. No thyromegaly present.  Cardiovascular: Normal rate.  Pulmonary/Chest: Effort normal.  Abdominal: Soft.  Neurological: She is alert and oriented to person, place, and time.  Skin: Skin is warm and dry.  Psychiatric: She has a normal mood and affect. Her behavior is normal.    Ortho Exam cervical range of motion well-healed anterior incision.  Healed lumbar incision.  Quads hip flexors are strong.  Negative straight leg raising 90 degrees.  No edema of the lower extremities.  Normal heel toe gait.  Normal hip range of motion.  Specialty Comments:  No specialty comments available.  Imaging: No results found.   PMFS History: Patient Active Problem List   Diagnosis Date Noted  . Spondylolisthesis of lumbar region 07/02/2018  . Morbid (severe) obesity due to excess calories ([place]) 07/02/2018  . Low back pain 07/02/2018  . Edema extremities 05/13/2018  . Presence of left artificial knee joint 04/14/2017  . Other intervertebral disc degeneration, lumbar region 04/14/2017  . Lumbar foraminal stenosis 04/14/2017  . Arthritis of left knee  10/01/2016  . OSA (obstructive sleep apnea) 04/22/2016  . SVT (supraventricular tachycardia) ([place]) 11/08/2015  . SOB (shortness of breath) 08/23/2015  . Allergic rhinitis 08/22/2015  . Absolute anemia 08/22/2015  . Essential (primary) hypertension 08/22/2015  . Gastro-esophageal reflux disease without esophagitis 08/22/2015  . Hereditary and idiopathic neuropathy 08/22/2015  . Migraine with typical aura 08/22/2015  . Unilateral primary osteoarthritis, left knee 08/22/2015  . Borderline diabetes mellitus 08/22/2015  .  Pure hypercholesterolemia 08/22/2015  . Systemic sclerosis ([place]) 08/22/2015  . Fast heart beat 08/22/2015  . Absence of bladder continence 08/22/2015  . Apnea, sleep 07/16/2015  . Cataract 09/26/2013  . Cataract of left eye 08/29/2013  . Cervical radiculopathy 07/20/2012  . Ulnar neuropathy at elbow 07/20/2012   Past Medical History:  Diagnosis Date  . Arthritis   . Asthma   . Complication of anesthesia    has difficulties related to being Native American; multiple days in hospital after hysterectomy years ago  . COPD (chronic obstructive pulmonary disease) ([place])   . Depression    in the past  . Fibromyalgia   . GERD (gastroesophageal reflux disease)   . Headache(784.0)    migraines  . Hyperlipidemia   . Hypertension   . Lupus ([place])   . Macular degeneration    left eye  . Migraine   . Neuropathy   . OSA (obstructive sleep apnea) 04/22/2016   Moderate with AHI 16/hr on CPAP  . Peptic ulcer disease   . Restless legs   . SVT (supraventricular tachycardia) ([place]) 12/03/2015   ablation for SVT    Family History  Problem Relation Age of Onset  . Heart disease Mother   . Heart attack Mother   . Hypertension Mother   . Heart attack Father   . Heart disease Father   . Hypertension Father   . Breast cancer Sister   . Breast cancer Sister     Past Surgical History:  Procedure Laterality Date  . ABDOMINAL HYSTERECTOMY    . ABLATION OF DYSRHYTHMIC FOCUS  12/03/2015   SVT  . ANTERIOR CERVICAL DECOMP/DISCECTOMY FUSION  07/20/2012   Procedure: ANTERIOR CERVICAL DECOMPRESSION/DISCECTOMY FUSION 2 LEVELS;  Surgeon: Charlie Pitter, MD;  Location: [facility];  Service: Neurosurgery;  Laterality: N/A;  cervical three- four , four -  five  . APPENDECTOMY    . BACK SURGERY     lumbar fusion  . CARDIAC CATHETERIZATION N/A 09/14/2015   Procedure: Left Heart Cath and Coronary Angiography;  Surgeon: Sherren Mocha, MD;  Location: [facility];  Service: Cardiovascular;  Laterality:  N/A;  . CARPAL TUNNEL RELEASE     bilat  . CERVICAL FUSION     C2/3  . ELECTROPHYSIOLOGIC STUDY N/A 12/03/2015   Procedure: SVT Ablation;  Surgeon: Evans Lance, MD;  Location: [facility] [facility];  Service: Cardiovascular;  Laterality: N/A;  . EYE SURGERY Bilateral    cataract surgery with lens implant  . KNEE ARTHROSCOPY Left   . NEUROMA SURGERY     L foot  . SHOULDER SURGERY    . TONSILLECTOMY    . TOTAL KNEE ARTHROPLASTY Left 10/01/2016   Procedure: LEFT TOTAL KNEE ARTHROPLASTY;  Surgeon: Marybelle Killings, MD;  Location: [facility];  Service: Orthopedics;  Laterality: Left;  . ULNAR NERVE TRANSPOSITION  07/20/2012   Procedure: ULNAR NERVE DECOMPRESSION/TRANSPOSITION;  Surgeon: Charlie Pitter, MD;  Location: [facility];  Service: Neurosurgery;  Laterality: Left;   Social History   Occupational  History  . Not on file  Tobacco Use  . Smoking status: Never Smoker  . Smokeless tobacco: Never Used  Substance and Sexual Activity  . Alcohol use: Yes    Comment: occasional wine  . Drug use: No  . Sexual activity: Not on file

## 2018-08-19 ENCOUNTER — Other Ambulatory Visit: Payer: Self-pay | Admitting: Family Medicine

## 2018-08-19 DIAGNOSIS — Z1231 Encounter for screening mammogram for malignant neoplasm of breast: Secondary | ICD-10-CM

## 2018-09-01 DIAGNOSIS — J309 Allergic rhinitis, unspecified: Secondary | ICD-10-CM | POA: Diagnosis not present

## 2018-09-29 ENCOUNTER — Ambulatory Visit
Admission: RE | Admit: 2018-09-29 | Discharge: 2018-09-29 | Disposition: A | Discharge status: Home / Self Care | Payer: PPO | Source: Ambulatory Visit | Attending: Family Medicine | Admitting: Family Medicine

## 2018-09-29 DIAGNOSIS — Z1231 Encounter for screening mammogram for malignant neoplasm of breast: Secondary | ICD-10-CM

## 2018-10-08 IMAGING — MR MR LUMBAR SPINE WO/W CM
5 of 7 series · 27 of 48 positions shown · IV contrast (20 ML MULTIHANCE)
Comparison: Radiography 06/10/2018.  MRI 05/09/2015.

CLINICAL DATA: Low back pain radiating to both legs, 8 months
duration.

EXAM:
MRI LUMBAR SPINE WITHOUT AND WITH CONTRAST
TECHNIQUE: Multiplanar and multiecho pulse sequences of the lumbar spine were
obtained without and with intravenous contrast.
CONTRAST:  20mL MULTIHANCE GADOBENATE DIMEGLUMINE 529 MG/ML IV SOLN

[Series 3: T1 · sagittal · 4.0mm · 0.51mm/px · 4 of 15 slices shown (1 of 2)]
[im 1/15]
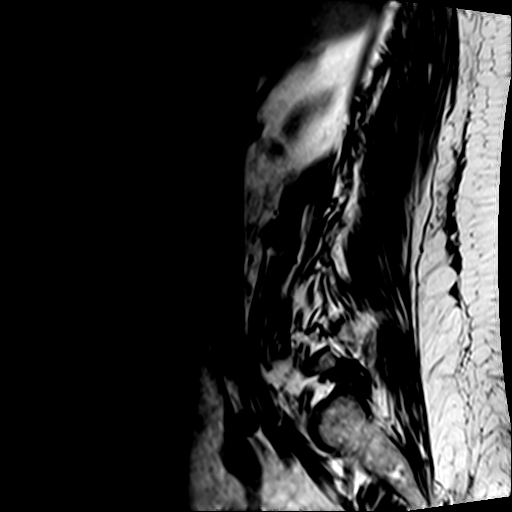
[im 5/15]
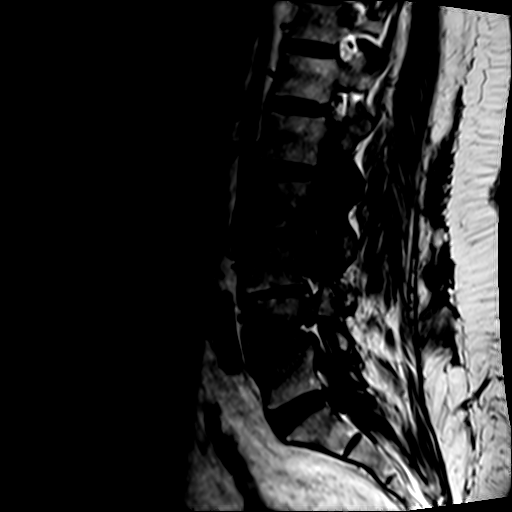
[im 10/15]
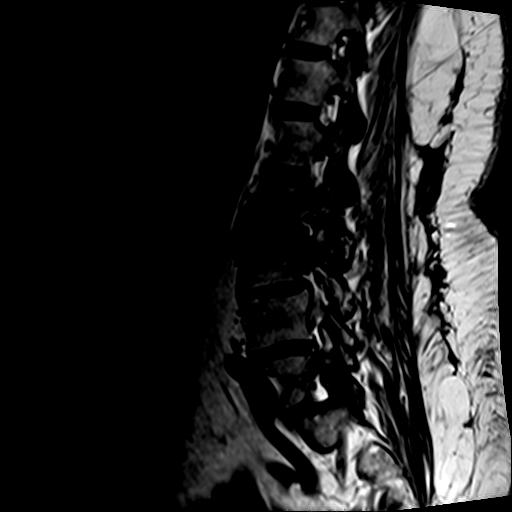
[im 15/15]
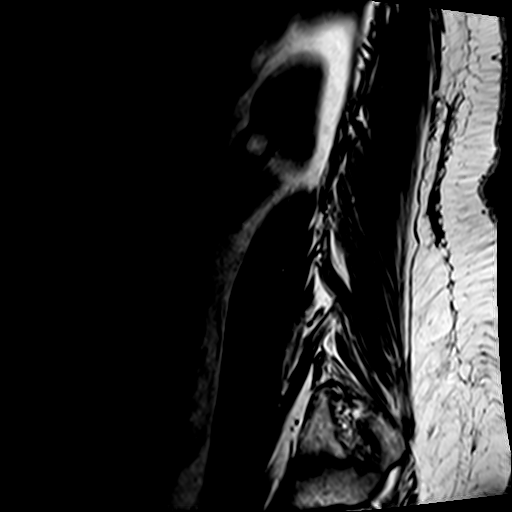

[Series 4: T2 · axial · 4.0mm · 0.39mm/px · z∈[-96,+78]mm · 8 of 32 slices shown (1 of 2)]
[im 1/32]
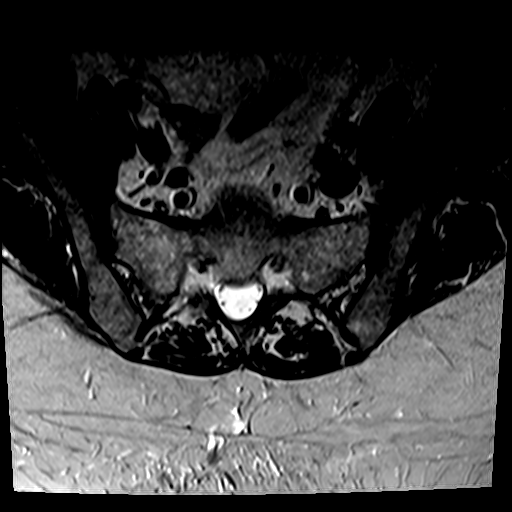
[im 4/32]
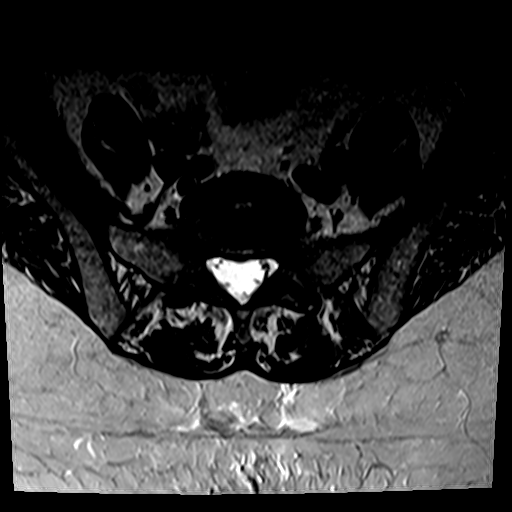
[im 11/32]
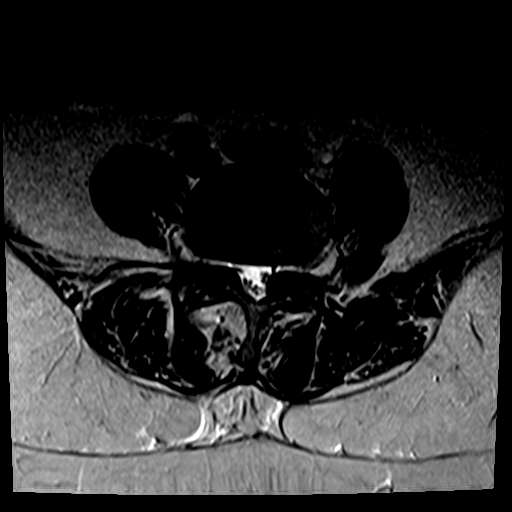
[im 14/32]
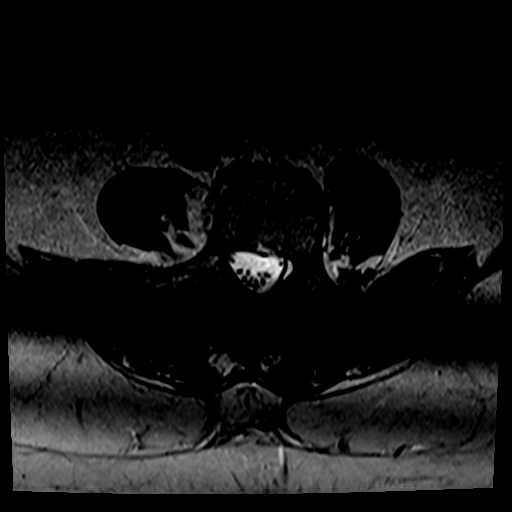
[im 18/32]
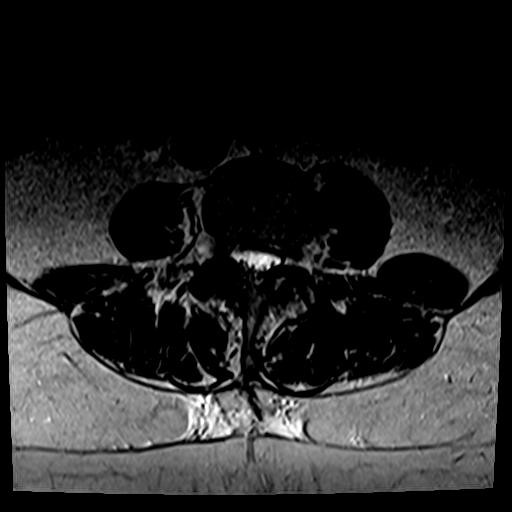
[im 21/32]
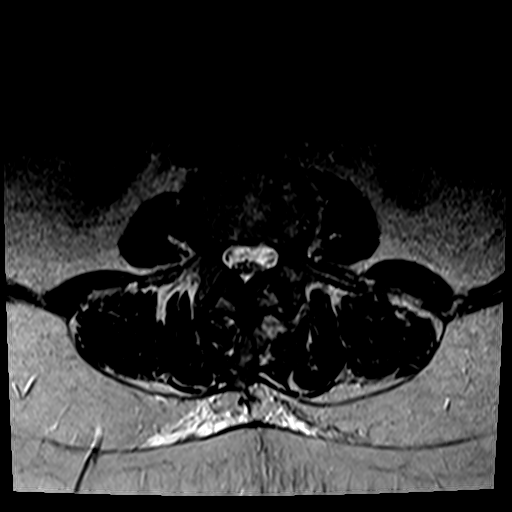
[im 28/32]
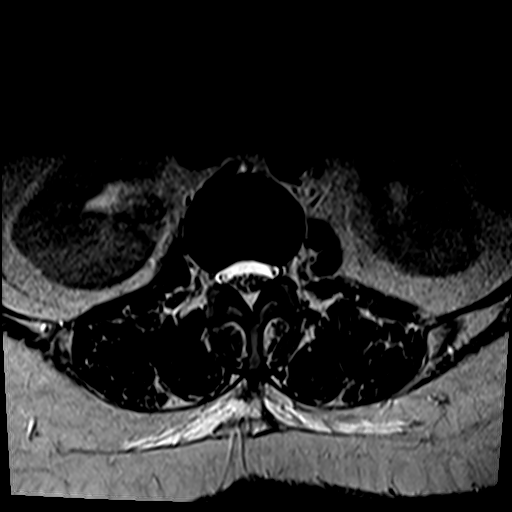
[im 32/32]
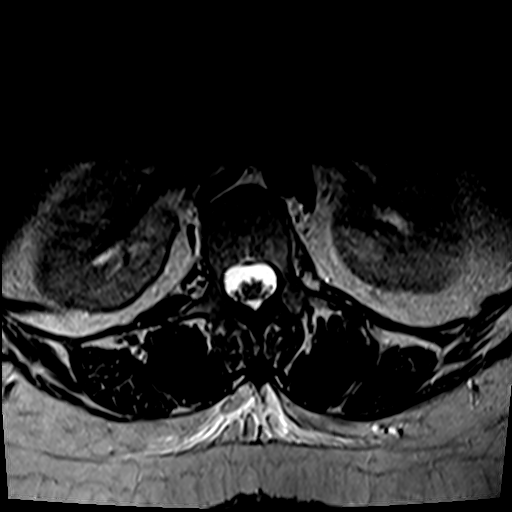

[Series 5: T1 · axial · 4.0mm · 0.78mm/px · z∈[-96,+78]mm · 8 of 32 slices shown (2 of 2)]
[im 1/32]
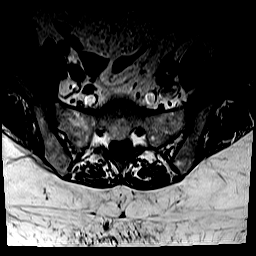
[im 4/32]
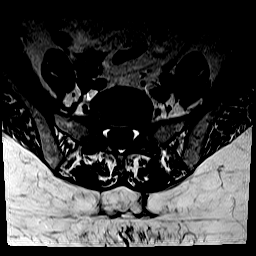
[im 11/32]
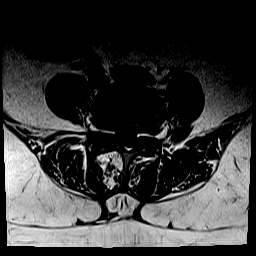
[im 14/32]
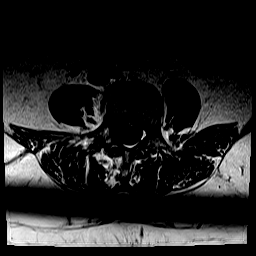
[im 18/32]
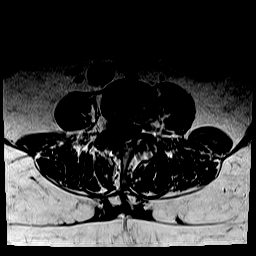
[im 21/32]
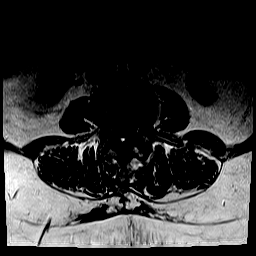
[im 28/32]
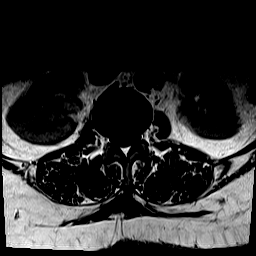
[im 32/32]
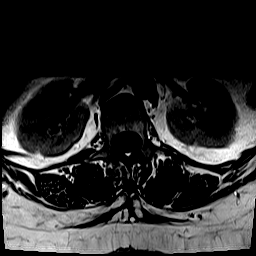

[Series 7: T2 · sagittal · 4.0mm · 1.02mm/px · 5 of 15 slices shown (2 of 2)]
[im 1/15]
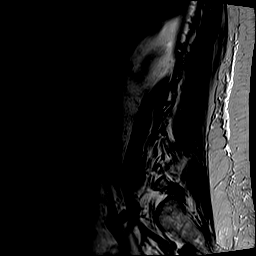
[im 4/15]
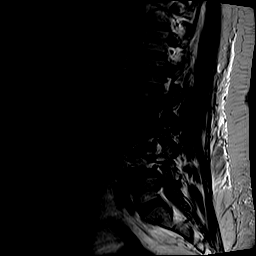
[im 8/15]
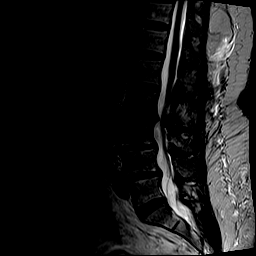
[im 11/15]
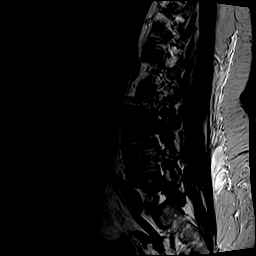
[im 15/15]
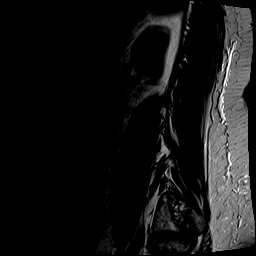

[Series 8: T1 fat-sat post-contrast · sagittal · 4.0mm · 0.51mm/px · 2 of 15 slices shown]
[im 1/15]
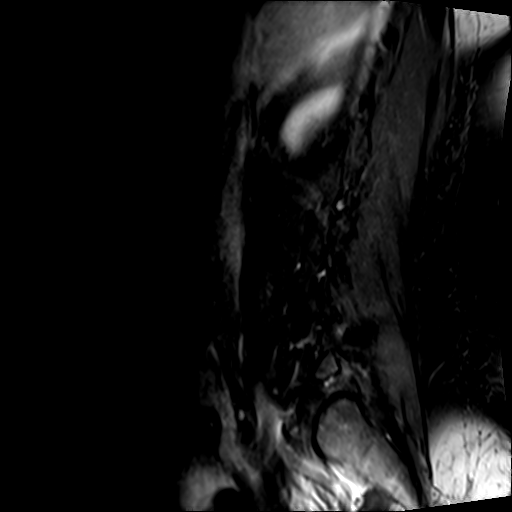
[im 4/15]
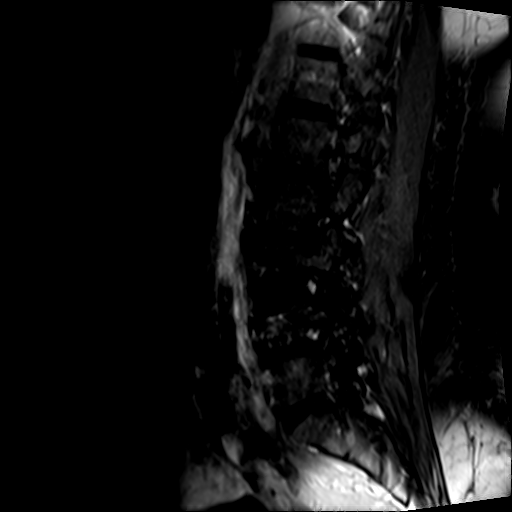

[27 of 48 positions shown; findings below may reference images not displayed]

FINDINGS: Segmentation:  5 lumbar type vertebral bodies.

Alignment: 2 mm retrolisthesis L2-3. 2 mm anterolisthesis L3-4. 2 mm
anterolisthesis L4-5.

Vertebrae:  No fracture or primary bone lesion.

Conus medullaris and cauda equina: Conus extends to the L1-2 level.
Conus and cauda equina appear normal.

Paraspinal and other soft tissues: Negative

Disc levels:

No significant finding at T11-12, T12-L1 or L1-2.

L2-3: 2 mm retrolisthesis. Disc degeneration more pronounced on the
left. Endplate osteophytes and bulging of the disc. Facet and
ligamentous hypertrophy. Mild multifactorial stenosis of the canal
and lateral recesses. Foraminal narrowing on the left.

L3-4: 2 mm anterolisthesis. Bilateral facet osteoarthritis. Bulging
of the disc with a small left extraforaminal disc herniation that
could affect the left L3 nerve.

L4-5: 2 mm anterolisthesis. Mild bulging of the disc. Mild facet and
ligamentous hypertrophy. Mild narrowing of the lateral recesses
without visible neural compression.

L5-S1: Mild facet osteoarthritis. No canal or foraminal stenosis.
The

Since the study of 5026, the only changes at L2-3 where there is a
progressive degeneration of the disc with loss of height. Left
foraminal to extraforaminal disc material seen at that time as
involuted somewhat, but there is slight worsening of foraminal
stenosis.
IMPRESSION: L2-3: Progressive disc degeneration since 5026. Mild multifactorial
canal and lateral recess stenosis. Foraminal narrowing on the left
that could affect the exiting L2 nerve.

L3-4: Disc degeneration and facet degeneration. Chronic left
extraforaminal disc protrusion. No apparent change since 5026.

L4-5: Chronic disc degeneration and facet degeneration. No
compressive stenosis.

L5-S1: Mild facet osteoarthritis.

## 2018-12-06 DIAGNOSIS — Z1389 Encounter for screening for other disorder: Secondary | ICD-10-CM | POA: Diagnosis not present

## 2018-12-06 DIAGNOSIS — Z800 Family history of malignant neoplasm of digestive organs: Secondary | ICD-10-CM | POA: Diagnosis not present

## 2018-12-06 DIAGNOSIS — J309 Allergic rhinitis, unspecified: Secondary | ICD-10-CM | POA: Diagnosis not present

## 2018-12-06 DIAGNOSIS — E7800 Pure hypercholesterolemia, unspecified: Secondary | ICD-10-CM | POA: Diagnosis not present

## 2018-12-06 DIAGNOSIS — I10 Essential (primary) hypertension: Secondary | ICD-10-CM | POA: Diagnosis not present

## 2018-12-06 DIAGNOSIS — D649 Anemia, unspecified: Secondary | ICD-10-CM | POA: Diagnosis not present

## 2018-12-06 DIAGNOSIS — K219 Gastro-esophageal reflux disease without esophagitis: Secondary | ICD-10-CM | POA: Diagnosis not present

## 2018-12-06 DIAGNOSIS — Z0000 Encounter for general adult medical examination without abnormal findings: Secondary | ICD-10-CM | POA: Diagnosis not present

## 2018-12-06 DIAGNOSIS — R7303 Prediabetes: Secondary | ICD-10-CM | POA: Diagnosis not present

## 2018-12-06 DIAGNOSIS — G609 Hereditary and idiopathic neuropathy, unspecified: Secondary | ICD-10-CM | POA: Diagnosis not present

## 2018-12-06 DIAGNOSIS — M349 Systemic sclerosis, unspecified: Secondary | ICD-10-CM | POA: Diagnosis not present

## 2019-03-31 DIAGNOSIS — K640 First degree hemorrhoids: Secondary | ICD-10-CM | POA: Diagnosis not present

## 2019-03-31 DIAGNOSIS — D124 Benign neoplasm of descending colon: Secondary | ICD-10-CM | POA: Diagnosis not present

## 2019-03-31 DIAGNOSIS — Z800 Family history of malignant neoplasm of digestive organs: Secondary | ICD-10-CM | POA: Diagnosis not present

## 2019-03-31 DIAGNOSIS — D122 Benign neoplasm of ascending colon: Secondary | ICD-10-CM | POA: Diagnosis not present

## 2019-04-05 DIAGNOSIS — D124 Benign neoplasm of descending colon: Secondary | ICD-10-CM | POA: Diagnosis not present

## 2019-04-05 DIAGNOSIS — D122 Benign neoplasm of ascending colon: Secondary | ICD-10-CM | POA: Diagnosis not present

## 2019-04-27 ENCOUNTER — Other Ambulatory Visit: Payer: Self-pay | Admitting: *Deleted

## 2019-04-27 DIAGNOSIS — Z20822 Contact with and (suspected) exposure to covid-19: Secondary | ICD-10-CM

## 2019-04-27 NOTE — Progress Notes (Signed)
[id number] 

## 2019-05-01 LAB — NOVEL CORONAVIRUS, NAA: SARS-CoV-2, NAA: NOT DETECTED

## 2019-06-10 DIAGNOSIS — E7800 Pure hypercholesterolemia, unspecified: Secondary | ICD-10-CM | POA: Diagnosis not present

## 2019-06-10 DIAGNOSIS — M5430 Sciatica, unspecified side: Secondary | ICD-10-CM | POA: Diagnosis not present

## 2019-06-10 DIAGNOSIS — R7303 Prediabetes: Secondary | ICD-10-CM | POA: Diagnosis not present

## 2019-06-10 DIAGNOSIS — I10 Essential (primary) hypertension: Secondary | ICD-10-CM | POA: Diagnosis not present

## 2019-06-16 DIAGNOSIS — G4733 Obstructive sleep apnea (adult) (pediatric): Secondary | ICD-10-CM | POA: Diagnosis not present

## 2019-06-29 ENCOUNTER — Telehealth: Payer: Self-pay | Admitting: *Deleted

## 2019-06-29 NOTE — Telephone Encounter (Signed)

## 2019-07-05 NOTE — Progress Notes (Signed)
Virtual Visit via Telephone Note   This visit type was conducted due to national recommendations for restrictions regarding the COVID-19 Pandemic (e.g. social distancing) in an effort to limit this patient's exposure and mitigate transmission in our community.  Due to her co-morbid illnesses, this patient is at least at moderate risk for complications without adequate follow up.  This format is felt to be most appropriate for this patient at this time.  All issues noted in this document were discussed and addressed.  A limited physical exam was performed with this format.  Please refer to the patient's chart for her consent to telehealth for [facility].   Evaluation Performed:  Follow-up visit  This visit type was conducted due to national recommendations for restrictions regarding the COVID-19 Pandemic (e.g. social distancing).  This format is felt to be most appropriate for this patient at this time.  All issues noted in this document were discussed and addressed.  No physical exam was performed (except for noted visual exam findings with Video Visits).  Please refer to the patient's chart (MyChart message for video visits and phone note for telephone visits) for the patient's consent to telehealth for [facility].  Date:  07/06/2019   ID:  Jessica Watts, DOB March 28, 1946, MRN [id number]  Patient Location:  Home  Provider location:   [place]  PCP:  Kelton Pillar, MD  Sleep medicine:  Fransico Him, MD Electrophysiologist:  None   Chief Complaint:  OSA  History of Present Illness:    Jessica Watts is a 73 y.o. female who presents via audio/video conferencing for a telehealth visit today.    This is a 73yo female with a hx of chest pain and shortness of breath with cath done for false positive nuclear stress test showing normal coronary arteries and normal LVEDP.  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  She has chronic dyspnea on exertion  with extreme walking.  She also has OSA with an AHI of 16.0/h and is on CPAP at 14 cm H2O.    She is here today for followup and is doing well.  She denies any chest pain or pressure,  PND, orthopnea, LE edema, dizziness, palpitations or syncope.  She has chronic DOE and LE edema which are stable.  She is compliant with her meds and is tolerating meds with no SE.    She is doing well with her CPAP device and thinks that she has gotten used to it.  She tolerates the nasal mask and feels the pressure is adequate.  Since going on CPAP she feels rested in the am and has no significant daytime sleepiness.  She denies any significant mouth or nasal dryness or nasal congestion.  She does not think that he snores.    The patient does not have symptoms concerning for COVID-19 infection (fever, chills, cough, or new shortness of breath).   Prior CV studies:   The following studies were reviewed today:  PAP compliance download  Past Medical History:  Diagnosis Date   Arthritis    Asthma    Complication of anesthesia    has difficulties related to being Native American; multiple days in hospital after hysterectomy years ago   COPD (chronic obstructive pulmonary disease) ([place])    Depression    in the past   Fibromyalgia    GERD (gastroesophageal reflux disease)    Headache(784.0)    migraines   Hyperlipidemia    Hypertension  Lupus ([place])    Macular degeneration    left eye   Migraine    Neuropathy    OSA (obstructive sleep apnea) 04/22/2016   Moderate with AHI 16/hr on CPAP   Peptic ulcer disease    Restless legs    SVT (supraventricular tachycardia) ([place]) 12/03/2015   ablation for SVT   Past Surgical History:  Procedure Laterality Date   ABDOMINAL HYSTERECTOMY     ABLATION OF DYSRHYTHMIC FOCUS  12/03/2015   SVT   ANTERIOR CERVICAL DECOMP/DISCECTOMY FUSION  07/20/2012   Procedure: ANTERIOR CERVICAL DECOMPRESSION/DISCECTOMY FUSION 2 LEVELS;  Surgeon: Charlie Pitter,  MD;  Location: [facility];  Service: Neurosurgery;  Laterality: N/A;  cervical three- four , four -  five   APPENDECTOMY     BACK SURGERY     lumbar fusion   CARDIAC CATHETERIZATION N/A 09/14/2015   Procedure: Left Heart Cath and Coronary Angiography;  Surgeon: Sherren Mocha, MD;  Location: [place] [facility];  Service: Cardiovascular;  Laterality: N/A;   CARPAL TUNNEL RELEASE     bilat   CERVICAL FUSION     C2/3   ELECTROPHYSIOLOGIC STUDY N/A 12/03/2015   Procedure: SVT Ablation;  Surgeon: Evans Lance, MD;  Location: [facility];  Service: Cardiovascular;  Laterality: N/A;   EYE SURGERY Bilateral    cataract surgery with lens implant   KNEE ARTHROSCOPY Left    NEUROMA SURGERY     L foot   SHOULDER SURGERY     TONSILLECTOMY     TOTAL KNEE ARTHROPLASTY Left 10/01/2016   Procedure: LEFT TOTAL KNEE ARTHROPLASTY;  Surgeon: Marybelle Killings, MD;  Location: [facility];  Service: Orthopedics;  Laterality: Left;   ULNAR NERVE TRANSPOSITION  07/20/2012   Procedure: ULNAR NERVE DECOMPRESSION/TRANSPOSITION;  Surgeon: Charlie Pitter, MD;  Location: [facility];  Service: Neurosurgery;  Laterality: Left;     Current Meds  Medication Sig   albuterol (PROAIR HFA) 108 (90 BASE) MCG/ACT inhaler Inhale 2 puffs into the lungs every 4 (four) hours as needed for wheezing or shortness of breath.    aspirin EC 81 MG tablet Take 81 mg by mouth daily.   calcium carbonate (OS-CAL - DOSED IN MG OF ELEMENTAL CALCIUM) 1250 (500 Ca) MG tablet Take 1 tablet by mouth daily.   chlorpheniramine (CHLOR-TRIMETON) 4 MG tablet Take 4 mg by mouth daily.   cholecalciferol (VITAMIN D) 1000 units tablet Take 1,000 Units by mouth daily.   cycloSPORINE (RESTASIS) 0.05 % ophthalmic emulsion Place 1 drop into both eyes 2 (two) times daily as needed (dry eyes).   fluticasone (FLONASE) 50 MCG/ACT nasal spray Place 1 spray into both nostrils daily as needed for allergies. For nasal congestion    hydrochlorothiazide (HYDRODIURIL) 12.5 MG tablet Take 12.5 mg by mouth daily.   losartan (COZAAR) 100 MG tablet Take 100 mg by mouth daily.   methocarbamol (ROBAXIN) 500 MG tablet Take 1 tablet (500 mg total) by mouth every 8 (eight) hours as needed for muscle spasms.   omeprazole (PRILOSEC) 40 MG capsule Take 40 mg by mouth daily.   [DISCONTINUED] methylPREDNISolone (MEDROL) 4 MG tablet 6-day taper to be taken as directed     Allergies:   Ace inhibitors, Amoxicillin, and Sulfa antibiotics   Social History   Tobacco Use   Smoking status: Never Smoker   Smokeless tobacco: Never Used  Substance Use Topics   Alcohol use: Yes    Comment: occasional wine   Drug use: No  Family Hx: The patient's family history includes Breast cancer in her sister and sister; Heart attack in her father and mother; Heart disease in her father and mother; Hypertension in her father and mother.  ROS:   Please see the history of present illness.     All other systems reviewed and are negative.   Labs/Other Tests and Data Reviewed:    Recent Labs: No results found for requested labs within last 8760 hours.   Recent Lipid Panel No results found for: CHOL, TRIG, HDL, CHOLHDL, LDLCALC, LDLDIRECT  Wt Readings from Last 3 Encounters:  07/06/19 219 lb (99.3 kg)  07/28/18 215 lb (97.5 kg)  06/30/18 215 lb (97.5 kg)     Objective:    Vital Signs:  BP 134/62    Pulse 99    Ht 5' 0.05" (1.525 m)    Wt 219 lb (99.3 kg)    BMI 42.70 kg/m     ASSESSMENT & PLAN:    1.  OSA -  The patient is tolerating PAP therapy well without any problems. The PAP download was reviewed today and showed an AHI of 0.6/hr on 14 cm H2O with 97% compliance in using more than 4 hours nightly.  The patient has been using and benefiting from PAP use and will continue to benefit from therapy.   2.  HTN - her BP is controlled on exam today.  She will continue on Losartan HCT 100-12.5mg  daily.  Creatinine was stable at  0.79 in Feb 2020. She ran out of her [place] back in the winter and has not called to get a refill.  I will check a BMET.  3.  Obesity - I have encouraged her to get into a routine exercise program and cut back on carbs and portions.   4.  SVT - s/p AVNRT ablation in 2017.  She has not had any palpitations.   5.  LE edema - this is fairly stable on diuretics.  I encouraged her to avoid added salt in her diet as she has been using table salt.  COVID-19 Education: The signs and symptoms of COVID-19 were discussed with the patient and how to seek care for testing (follow up with PCP or arrange E-visit).  The importance of social distancing was discussed today.  Patient Risk:   After full review of this patient's clinical status, I feel that they are at least moderate risk at this time.  Time:   Today, I have spent 20 minutes directly with the patient on telemedicine discussing medical problems including OSA, HTN, SVT, Edema.  We also reviewed the symptoms of COVID 19 and the ways to protect against contracting the virus with telehealth technology.  I spent an additional 5 minutes reviewing patient's chart including PAP compliance download and labs.  Medication Adjustments/Labs and Tests Ordered: Current medicines are reviewed at length with the patient today.  Concerns regarding medicines are outlined above.  Tests Ordered: No orders of the defined types were placed in this encounter.  Medication Changes: No orders of the defined types were placed in this encounter.   Disposition:  Follow up in 1 year(s)  Signed, Fransico Him, MD  07/06/2019 11:10 AM    [facility]

## 2019-07-06 ENCOUNTER — Other Ambulatory Visit: Payer: Self-pay

## 2019-07-06 ENCOUNTER — Telehealth (INDEPENDENT_AMBULATORY_CARE_PROVIDER_SITE_OTHER): Payer: PPO | Admitting: Cardiology

## 2019-07-06 ENCOUNTER — Encounter: Payer: Self-pay | Admitting: Cardiology

## 2019-07-06 VITALS — BP 134/62 | HR 99 | Ht 60.05 in | Wt 219.0 lb

## 2019-07-06 DIAGNOSIS — I10 Essential (primary) hypertension: Secondary | ICD-10-CM | POA: Diagnosis not present

## 2019-07-06 DIAGNOSIS — R600 Localized edema: Secondary | ICD-10-CM

## 2019-07-06 DIAGNOSIS — E669 Obesity, unspecified: Secondary | ICD-10-CM

## 2019-07-06 DIAGNOSIS — I471 Supraventricular tachycardia: Secondary | ICD-10-CM | POA: Diagnosis not present

## 2019-07-06 DIAGNOSIS — G4733 Obstructive sleep apnea (adult) (pediatric): Secondary | ICD-10-CM

## 2019-07-06 NOTE — Patient Instructions (Signed)
Medication Instructions:  Your physician recommends that you continue on your current medications as directed. Please refer to the Current Medication list given to you today.  If you need a refill on your cardiac medications before your next appointment, please call your pharmacy.   Lab work: 07/07/2019:  BMET  If you have labs (blood work) drawn today and your tests are completely normal, you will receive your results only by: Marland Kitchen MyChart Message (if you have MyChart) OR . A paper copy in the mail If you have any lab test that is abnormal or we need to change your treatment, we will call you to review the results.  Testing/Procedures: None ordered  Follow-Up: At [facility], you and your health needs are our priority.  As part of our continuing mission to provide you with exceptional heart care, we have created designated Provider Care Teams.  These Care Teams include your primary Cardiologist (physician) and Advanced Practice Providers (APPs -  Physician Assistants and Nurse Practitioners) who all work together to provide you with the care you need, when you need it. You will need a follow up appointment in 12 months.  Please call our office 2 months in advance to schedule this appointment.  You may see Dr. Radford Pax or one of the following Advanced Practice Providers on your designated Care Team:   [place], PA-C Melina Copa, PA-C . Ermalinda Barrios, PA-C  Any Other Special Instructions Will Be Listed Below (If Applicable).

## 2019-07-07 ENCOUNTER — Other Ambulatory Visit: Payer: PPO | Admitting: *Deleted

## 2019-07-07 ENCOUNTER — Other Ambulatory Visit: Payer: Self-pay

## 2019-07-07 DIAGNOSIS — G4733 Obstructive sleep apnea (adult) (pediatric): Secondary | ICD-10-CM

## 2019-07-07 DIAGNOSIS — I471 Supraventricular tachycardia, unspecified: Secondary | ICD-10-CM

## 2019-07-07 DIAGNOSIS — I10 Essential (primary) hypertension: Secondary | ICD-10-CM | POA: Diagnosis not present

## 2019-07-07 DIAGNOSIS — E669 Obesity, unspecified: Secondary | ICD-10-CM

## 2019-07-07 DIAGNOSIS — R600 Localized edema: Secondary | ICD-10-CM

## 2019-07-07 LAB — BASIC METABOLIC PANEL
BUN/Creatinine Ratio: 11 — ABNORMAL LOW (ref 12–28)
BUN: 9 mg/dL (ref 8–27)
CO2: 25 mmol/L (ref 20–29)
Calcium: 9.3 mg/dL (ref 8.7–10.3)
Chloride: 100 mmol/L (ref 96–106)
Creatinine, Ser: 0.79 mg/dL (ref 0.57–1.00)
GFR calc Af Amer: 87 mL/min/{1.73_m2} (ref >59)
GFR calc non Af Amer: 76 mL/min/{1.73_m2} (ref >59)
Glucose: 109 mg/dL — ABNORMAL HIGH (ref 65–99)
Potassium: 3.8 mmol/L (ref 3.5–5.2)
Sodium: 140 mmol/L (ref 134–144)

## 2019-07-27 DIAGNOSIS — M79605 Pain in left leg: Secondary | ICD-10-CM | POA: Diagnosis not present

## 2019-07-27 DIAGNOSIS — M5432 Sciatica, left side: Secondary | ICD-10-CM | POA: Diagnosis not present

## 2019-07-27 DIAGNOSIS — M79604 Pain in right leg: Secondary | ICD-10-CM | POA: Diagnosis not present

## 2019-07-27 DIAGNOSIS — M545 Low back pain: Secondary | ICD-10-CM | POA: Diagnosis not present

## 2019-07-29 DIAGNOSIS — M79605 Pain in left leg: Secondary | ICD-10-CM | POA: Diagnosis not present

## 2019-07-29 DIAGNOSIS — M5432 Sciatica, left side: Secondary | ICD-10-CM | POA: Diagnosis not present

## 2019-07-29 DIAGNOSIS — M79604 Pain in right leg: Secondary | ICD-10-CM | POA: Diagnosis not present

## 2019-07-29 DIAGNOSIS — M545 Low back pain: Secondary | ICD-10-CM | POA: Diagnosis not present

## 2019-08-01 DIAGNOSIS — M79605 Pain in left leg: Secondary | ICD-10-CM | POA: Diagnosis not present

## 2019-08-01 DIAGNOSIS — M545 Low back pain: Secondary | ICD-10-CM | POA: Diagnosis not present

## 2019-08-01 DIAGNOSIS — M5432 Sciatica, left side: Secondary | ICD-10-CM | POA: Diagnosis not present

## 2019-08-01 DIAGNOSIS — M79604 Pain in right leg: Secondary | ICD-10-CM | POA: Diagnosis not present

## 2019-08-03 DIAGNOSIS — M5432 Sciatica, left side: Secondary | ICD-10-CM | POA: Diagnosis not present

## 2019-08-03 DIAGNOSIS — M545 Low back pain: Secondary | ICD-10-CM | POA: Diagnosis not present

## 2019-08-03 DIAGNOSIS — M79605 Pain in left leg: Secondary | ICD-10-CM | POA: Diagnosis not present

## 2019-08-03 DIAGNOSIS — M79604 Pain in right leg: Secondary | ICD-10-CM | POA: Diagnosis not present

## 2019-08-08 DIAGNOSIS — M545 Low back pain: Secondary | ICD-10-CM | POA: Diagnosis not present

## 2019-08-08 DIAGNOSIS — M5432 Sciatica, left side: Secondary | ICD-10-CM | POA: Diagnosis not present

## 2019-08-08 DIAGNOSIS — M79605 Pain in left leg: Secondary | ICD-10-CM | POA: Diagnosis not present

## 2019-08-08 DIAGNOSIS — M79604 Pain in right leg: Secondary | ICD-10-CM | POA: Diagnosis not present

## 2019-08-12 DIAGNOSIS — M79604 Pain in right leg: Secondary | ICD-10-CM | POA: Diagnosis not present

## 2019-08-12 DIAGNOSIS — M545 Low back pain: Secondary | ICD-10-CM | POA: Diagnosis not present

## 2019-08-12 DIAGNOSIS — M79605 Pain in left leg: Secondary | ICD-10-CM | POA: Diagnosis not present

## 2019-08-12 DIAGNOSIS — M5432 Sciatica, left side: Secondary | ICD-10-CM | POA: Diagnosis not present

## 2019-08-15 DIAGNOSIS — M79604 Pain in right leg: Secondary | ICD-10-CM | POA: Diagnosis not present

## 2019-08-15 DIAGNOSIS — M79605 Pain in left leg: Secondary | ICD-10-CM | POA: Diagnosis not present

## 2019-08-15 DIAGNOSIS — M545 Low back pain: Secondary | ICD-10-CM | POA: Diagnosis not present

## 2019-08-15 DIAGNOSIS — M5432 Sciatica, left side: Secondary | ICD-10-CM | POA: Diagnosis not present

## 2019-08-31 DIAGNOSIS — M79605 Pain in left leg: Secondary | ICD-10-CM | POA: Diagnosis not present

## 2019-08-31 DIAGNOSIS — M5432 Sciatica, left side: Secondary | ICD-10-CM | POA: Diagnosis not present

## 2019-08-31 DIAGNOSIS — M545 Low back pain: Secondary | ICD-10-CM | POA: Diagnosis not present

## 2019-08-31 DIAGNOSIS — M79604 Pain in right leg: Secondary | ICD-10-CM | POA: Diagnosis not present

## 2019-09-02 DIAGNOSIS — M79604 Pain in right leg: Secondary | ICD-10-CM | POA: Diagnosis not present

## 2019-09-02 DIAGNOSIS — M545 Low back pain: Secondary | ICD-10-CM | POA: Diagnosis not present

## 2019-09-02 DIAGNOSIS — M79605 Pain in left leg: Secondary | ICD-10-CM | POA: Diagnosis not present

## 2019-09-02 DIAGNOSIS — M5432 Sciatica, left side: Secondary | ICD-10-CM | POA: Diagnosis not present

## 2019-09-12 DIAGNOSIS — M79604 Pain in right leg: Secondary | ICD-10-CM | POA: Diagnosis not present

## 2019-09-12 DIAGNOSIS — M545 Low back pain: Secondary | ICD-10-CM | POA: Diagnosis not present

## 2019-09-12 DIAGNOSIS — M79605 Pain in left leg: Secondary | ICD-10-CM | POA: Diagnosis not present

## 2019-09-12 DIAGNOSIS — M5432 Sciatica, left side: Secondary | ICD-10-CM | POA: Diagnosis not present

## 2019-09-16 DIAGNOSIS — M5432 Sciatica, left side: Secondary | ICD-10-CM | POA: Diagnosis not present

## 2019-09-16 DIAGNOSIS — M79605 Pain in left leg: Secondary | ICD-10-CM | POA: Diagnosis not present

## 2019-09-16 DIAGNOSIS — M79604 Pain in right leg: Secondary | ICD-10-CM | POA: Diagnosis not present

## 2019-09-16 DIAGNOSIS — M545 Low back pain: Secondary | ICD-10-CM | POA: Diagnosis not present

## 2019-09-19 DIAGNOSIS — M79604 Pain in right leg: Secondary | ICD-10-CM | POA: Diagnosis not present

## 2019-09-19 DIAGNOSIS — M79605 Pain in left leg: Secondary | ICD-10-CM | POA: Diagnosis not present

## 2019-09-19 DIAGNOSIS — M545 Low back pain: Secondary | ICD-10-CM | POA: Diagnosis not present

## 2019-09-19 DIAGNOSIS — M5432 Sciatica, left side: Secondary | ICD-10-CM | POA: Diagnosis not present

## 2019-09-20 ENCOUNTER — Telehealth: Payer: Self-pay | Admitting: Cardiology

## 2019-09-20 NOTE — Telephone Encounter (Signed)
New Message  Pt c/o swelling: STAT is pt has developed SOB within 24 hours  1) How much weight have you gained and in what time span? No weight gain  2) If swelling, where is the swelling located? Legs and feet, pain in thighs, fluctuating blood pressure,  some SOB  3) Are you currently taking a fluid pill? No  4) Are you currently SOB? Yes for months  5) Do you have a log of your daily weights (if so, list)? No  6) Have you gained 3 pounds in a day or 5 pounds in a week? No  7) Have you traveled recently? No

## 2019-09-20 NOTE — Telephone Encounter (Signed)
Patient reports that over the past two months she has had increased swelling in her feet and legs. She does not keep up with her weight regularly but has gained 10lbs since June 2020. She also reports pain in her thighs that she describes as burning and tingling. Pt has been going to PT for back pain and her PCP recommended additional PT for the pain in her legs. Patient thought she may have sciatica. However the pain in her thighs is persistent and mostly occurs at night after she has been laying for a while and tries to get up. Patient has chronic dyspnea with exertion however she states that it has worsened over the past couple months and she is SOB when only walking a short distance. Patient denies chest pain. Patient continues to take HCTZ 12.5 mg daily. Her most recent BP readings from yesterday and today were 124/73 and 150/78.  I advised the patient to keep her legs elevated and to reduce her salt intake. I also advised her to keep up with daily weights. Will forward to Dr. Radford Pax for recommendations.

## 2019-09-21 DIAGNOSIS — M545 Low back pain: Secondary | ICD-10-CM | POA: Diagnosis not present

## 2019-09-21 DIAGNOSIS — M79604 Pain in right leg: Secondary | ICD-10-CM | POA: Diagnosis not present

## 2019-09-21 DIAGNOSIS — M79605 Pain in left leg: Secondary | ICD-10-CM | POA: Diagnosis not present

## 2019-09-21 DIAGNOSIS — M5432 Sciatica, left side: Secondary | ICD-10-CM | POA: Diagnosis not present

## 2019-09-21 NOTE — Telephone Encounter (Signed)
Please see if you can get her in with an extender today or tomorrow for evaluation

## 2019-09-22 ENCOUNTER — Encounter: Payer: Self-pay | Admitting: Physician Assistant

## 2019-09-22 NOTE — Progress Notes (Signed)
Cardiology Office Note    Date:  09/23/2019   ID:  Jessica Watts, DOB 1946/08/14, MRN [id number]  PCP:  Kelton Pillar, MD  Cardiologist:  Fransico Him, MD  Electrophysiologist:  None   Chief Complaint: leg pain  History of Present Illness:   Jessica Watts is a 73 y.o. female with history of COPD, OSA (followed by Dr. 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 She has been followed by Dr. Radford Pax. 2D echo 08/2015 showed normal LVEF 60-65%, grade 1 DD, normal RV, trivial MR. She had an abnormal nuclear stress test in 08/2015, prompting cardiac cath which demonstrated normal coronary arteries and normal LVEDP. She wore an event monitor around that time demonstrating NSR and narrow complex tachycardia as well as PVCs. She underwent ablation of her SVT in 2017. She has had chronic mild lower extremity edema, managed with HCTZ. Last labs 06/2019 with K 3.8, Cr 0.79, glucose 109, 11/2022 Hgb normal by [facility], LDL 117.  She presents for evaluation of bilateral leg pain, R>L. It has been going on for several months. She previously thought it was sciatica. She also says her primary care suspected it was neuropathy. She is concerned it could represent a vascular problem. It starts around the side of her thighs and travels down the lateral part of her legs, described as a burning sensation. She also gets numbness. It happens regardless of activity or rest, but seems to be worse at night. She also has chronic lower extremity edema which she states is at baseline. BP is mildly elevated. She reports last home value was 134/73 which is where it normally runs, although she has had occasional reading in the 120s. She otherwise offers no other new cardiac complaints of CP, SOB, palpitations or dizziness.  Past Medical History:   Diagnosis Date  . Arthritis   . Asthma   . Complication of anesthesia    has difficulties related to being Native American; multiple days in hospital after hysterectomy years ago  . COPD (chronic obstructive pulmonary disease) ([place])   . Depression    in the past  . Fibromyalgia   . GERD (gastroesophageal reflux disease)   . Headache(784.0)    migraines  . Hyperlipidemia   . Hypertension   . Lupus ([place])   . Macular degeneration    left eye  . Migraine   . Neuropathy   . OSA (obstructive sleep apnea) 04/22/2016   Moderate with AHI 16/hr on CPAP  . Peptic ulcer disease   . Restless legs   . SVT (supraventricular tachycardia) ([place]) 12/03/2015   ablation for SVT 2017    Past Surgical History:  Procedure Laterality Date  . ABDOMINAL HYSTERECTOMY    . ABLATION OF DYSRHYTHMIC FOCUS  12/03/2015   SVT  . ANTERIOR CERVICAL DECOMP/DISCECTOMY FUSION  07/20/2012   Procedure: ANTERIOR CERVICAL DECOMPRESSION/DISCECTOMY FUSION 2 LEVELS;  Surgeon: Charlie Pitter, MD;  Location: [place];  Service: Neurosurgery;  Laterality: N/A;  cervical three- four , four -  five  . APPENDECTOMY    . BACK SURGERY     lumbar fusion  . CARDIAC CATHETERIZATION N/A 09/14/2015   Procedure: Left Heart Cath and Coronary Angiography;  Surgeon: Sherren Mocha, MD;  Location: [facility];  Service: Cardiovascular;  Laterality: N/A;  . CARPAL TUNNEL RELEASE     bilat  .  CERVICAL FUSION     C2/3  . ELECTROPHYSIOLOGIC STUDY N/A 12/03/2015   Procedure: SVT Ablation;  Surgeon: Evans Lance, MD;  Location: [place] [facility];  Service: Cardiovascular;  Laterality: N/A;  . EYE SURGERY Bilateral    cataract surgery with lens implant  . KNEE ARTHROSCOPY Left   . NEUROMA SURGERY     L foot  . SHOULDER SURGERY    . TONSILLECTOMY    . TOTAL KNEE ARTHROPLASTY Left 10/01/2016   Procedure: LEFT TOTAL KNEE ARTHROPLASTY;  Surgeon: Marybelle Killings, MD;  Location: [facility];  Service: Orthopedics;  Laterality: Left;  .  ULNAR NERVE TRANSPOSITION  07/20/2012   Procedure: ULNAR NERVE DECOMPRESSION/TRANSPOSITION;  Surgeon: Charlie Pitter, MD;  Location: [facility];  Service: Neurosurgery;  Laterality: Left;    Current Medications: Current Meds  Medication Sig  . albuterol (PROAIR HFA) 108 (90 BASE) MCG/ACT inhaler Inhale 2 puffs into the lungs every 4 (four) hours as needed for wheezing or shortness of breath.   Marland Kitchen aspirin EC 81 MG tablet Take 81 mg by mouth daily.  . calcium carbonate (OS-CAL - DOSED IN MG OF ELEMENTAL CALCIUM) 1250 (500 Ca) MG tablet Take 1 tablet by mouth daily.  . chlorpheniramine (CHLOR-TRIMETON) 4 MG tablet Take 4 mg by mouth daily.  . cholecalciferol (VITAMIN D) 1000 units tablet Take 1,000 Units by mouth daily.  . cycloSPORINE (RESTASIS) 0.05 % ophthalmic emulsion Place 1 drop into both eyes 2 (two) times daily as needed (dry eyes).  . fluticasone (FLONASE) 50 MCG/ACT nasal spray Place 1 spray into both nostrils daily as needed for allergies. For nasal congestion  . hydrochlorothiazide (HYDRODIURIL) 12.5 MG tablet Take 12.5 mg by mouth daily.  Marland Kitchen losartan (COZAAR) 100 MG tablet Take 100 mg by mouth daily.  Marland Kitchen omeprazole (PRILOSEC) 40 MG capsule Take 40 mg by mouth daily.      Allergies:   Ace inhibitors, Amoxicillin, and Sulfa antibiotics   Social History   Socioeconomic History  . Marital status: Divorced    Spouse name: Not on file  . Number of children: Not on file  . Years of education: Not on file  . Highest education level: Not on file  Occupational History  . Not on file  Tobacco Use  . Smoking status: Never Smoker  . Smokeless tobacco: Never Used  Substance and Sexual Activity  . Alcohol use: Yes    Comment: occasional wine  . Drug use: No  . Sexual activity: Not on file  Other Topics Concern  . Not on file  Social History Narrative  . Not on file   Social Determinants of Health   Financial Resource Strain:   . Difficulty of Paying Living Expenses: Not on  file  Food Insecurity:   . Worried About Charity fundraiser in the Last Year: Not on file  . Ran Out of Food in the Last Year: Not on file  Transportation Needs:   . Lack of Transportation (Medical): Not on file  . Lack of Transportation (Non-Medical): Not on file  Physical Activity:   . Days of Exercise per Week: Not on file  . Minutes of Exercise per Session: Not on file  Stress:   . Feeling of Stress : Not on file  Social Connections:   . Frequency of Communication with Friends and Family: Not on file  . Frequency of Social Gatherings with Friends and Family: Not on file  . Attends Religious Services: Not on file  .  Active Member of Clubs or Organizations: Not on file  . Attends Archivist Meetings: Not on file  . Marital Status: Not on file     Family History:  The patient's family history includes Breast cancer in her sister and sister; Heart attack in her father and mother; Heart disease in her father and mother; Hypertension in her father and mother.  ROS:   Please see the history of present illness.  All other systems are reviewed and otherwise negative.    EKGs/Labs/Other Studies Reviewed:    Studies reviewed were summarized above.   EKG:  EKG is ordered today, personally reviewed, demonstrating NSR 82bpm, cannot r/o prior anterior infarct but no acute changes from prior.  Recent Labs: 07/07/2019: BUN 9; Creatinine, Ser 0.79; Potassium 3.8; Sodium 140  Recent Lipid Panel No results found for: CHOL, TRIG, HDL, CHOLHDL, VLDL, LDLCALC, LDLDIRECT  PHYSICAL EXAM:    VS:  BP (!) 144/80   Pulse 82   Ht 5' 0.05" (1.525 m)   Wt 231 lb 12.8 oz (105.1 kg)   SpO2 97%   BMI 45.19 kg/m   BMI: Body mass index is 45.19 kg/m. Morbid obesity categorization.  GEN: Well nourished, well developed F in no acute distress HEENT: normocephalic, atraumatic Neck: no JVD, carotid bruits, or masses Cardiac: RRR; no murmurs, rubs, or gallops, trace nonpitting sockline edema  bilaterally. Pedal pulses are 1+ on the R and 2+ on the left. Both are cool but do not appear acutely compromised from vascular standpoint Respiratory:  clear to auscultation bilaterally, normal work of breathing GI: soft, nontender, nondistended, + BS MS: no deformity or atrophy Skin: warm and dry, no rash Neuro:  Alert and Oriented x 3, Strength and sensation are intact, follows commands Psych: euthymic mood, full affect  Wt Readings from Last 3 Encounters:  09/23/19 231 lb 12.8 oz (105.1 kg)  07/06/19 219 lb (99.3 kg)  07/28/18 215 lb (97.5 kg)     ASSESSMENT & PLAN:   1. Lower extremity pain suggestive of neuropathy - pain is atypical for PAD although she does have decreased pulses in her RLE. Will proceed with lower extemity noninvasive vascular testing to further exclude this from the differential. I'd also like to obtain a B12/folate level as well as CK and electrolytes to evaluate for any metabolic causes for leg pain or neuropathy. She stopped taking KCl in the past because she says she was told she didn't really need it. She did not have any issues with it, if it needs to be resumed in the future. Chronicity and appearance do not really suggest VTE. 2. Lower extremity edema - she reports this is at baseline and she otherwise would not have sought care for this. See below regarding BP. 3. Essential HTN - mildly elevated in clinic today. She also has mild edema on exam. If electrolytes are acceptable, will plan to titrate HCTZ to 25mg  once daily - may need to re-add some KCl. 4. PSVT - quiescent following ablation. Continue surveillance for recurrent symptoms. She is not on any baseline AVN blocking agents. 5. Lupus - she was released to discontinue Plaquenil about a year ago by rheumatology. If her vascular workup is unrevealing I would suggest revisiting with rheumatology. She reports her usual rheumatologist went on a sabbatical and she has not quite meshed with their colleague  replacement.  Disposition: F/u with Dr. Radford Pax or APP in 3 weeks virtually. I do not think I have any virtual clinic days in this  timeframe but she would be fine to see another APP.  Medication Adjustments/Labs and Tests Ordered: Current medicines are reviewed at length with the patient today.  Concerns regarding medicines are outlined above. Medication changes, Labs and Tests ordered today are summarized above and listed in the Patient Instructions accessible in Encounters.   Signed, Charlie Pitter, PA-C  09/23/2019 3:12 PM    [facility] [place], [place], [place]  [address] Phone: [phone]; Fax: [phone]

## 2019-09-23 ENCOUNTER — Other Ambulatory Visit: Payer: Self-pay

## 2019-09-23 ENCOUNTER — Ambulatory Visit: Payer: PPO | Admitting: Physician Assistant

## 2019-09-23 ENCOUNTER — Encounter: Payer: Self-pay | Admitting: Physician Assistant

## 2019-09-23 VITALS — BP 144/80 | HR 82 | Ht 60.05 in | Wt 231.8 lb

## 2019-09-23 DIAGNOSIS — I10 Essential (primary) hypertension: Secondary | ICD-10-CM

## 2019-09-23 DIAGNOSIS — G629 Polyneuropathy, unspecified: Secondary | ICD-10-CM | POA: Diagnosis not present

## 2019-09-23 DIAGNOSIS — M79604 Pain in right leg: Secondary | ICD-10-CM | POA: Diagnosis not present

## 2019-09-23 DIAGNOSIS — R600 Localized edema: Secondary | ICD-10-CM

## 2019-09-23 DIAGNOSIS — I471 Supraventricular tachycardia, unspecified: Secondary | ICD-10-CM

## 2019-09-23 DIAGNOSIS — M79605 Pain in left leg: Secondary | ICD-10-CM

## 2019-09-23 DIAGNOSIS — M329 Systemic lupus erythematosus, unspecified: Secondary | ICD-10-CM

## 2019-09-23 DIAGNOSIS — IMO0002 Reserved for concepts with insufficient information to code with codable children: Secondary | ICD-10-CM

## 2019-09-23 NOTE — Patient Instructions (Signed)
Medication Instructions:  CONTINUE WITH CURRENT MEDICATIONS *If you need a refill on your cardiac medications before your next appointment, please call your pharmacy*  Lab Work: TODAY B12 FOLATE, CK LEVEL, BMET, TSH, MAGNESIUM LEVEL. If you have labs (blood work) drawn today and your tests are completely normal, you will receive your results only by: Marland Kitchen MyChart Message (if you have MyChart) OR . A paper copy in the mail If you have any lab test that is abnormal or we need to change your treatment, we will call you to review the results.  Testing/Procedures: Your physician has requested that you have a lower extremity arterial exercise duplex. TO BE DONE WITH ABI's. During this test, exercise and ultrasound are used to evaluate arterial blood flow in the legs. Allow one hour for this exam. There are no restrictions or special instructions.   Follow-Up: At [facility], you and your health needs are our priority.  As part of our continuing mission to provide you with exceptional heart care, we have created designated Provider Care Teams.  These Care Teams include your primary Cardiologist (physician) and Advanced Practice Providers (APPs -  Physician Assistants and Nurse Practitioners) who all work together to provide you with the care you need, when you need it.  Your next appointment:   3 week(s)  The format for your next appointment:   Virtual Visit   Provider:   Melina Copa, PA-C OR DR. Radford Pax; IF NOT AVAILABLE OTHER APP OK ; APPT WILL BE AFTER TEST HAS BEEN COMPLETED  Other Instructions

## 2019-09-24 LAB — B12 AND FOLATE PANEL
Folate: 7 ng/mL (ref >3.0)
Vitamin B-12: 252 pg/mL (ref 232–1245)

## 2019-09-24 LAB — BASIC METABOLIC PANEL
BUN/Creatinine Ratio: 15 (ref 12–28)
BUN: 10 mg/dL (ref 8–27)
CO2: 26 mmol/L (ref 20–29)
Calcium: 9.3 mg/dL (ref 8.7–10.3)
Chloride: 102 mmol/L (ref 96–106)
Creatinine, Ser: 0.65 mg/dL (ref 0.57–1.00)
GFR calc Af Amer: 103 mL/min/{1.73_m2} (ref >59)
GFR calc non Af Amer: 89 mL/min/{1.73_m2} (ref >59)
Glucose: 91 mg/dL (ref 65–99)
Potassium: 4 mmol/L (ref 3.5–5.2)
Sodium: 143 mmol/L (ref 134–144)

## 2019-09-24 LAB — CK: Total CK: 44 U/L (ref 32–182)

## 2019-09-24 LAB — TSH: TSH: 2.35 u[IU]/mL (ref 0.450–4.500)

## 2019-09-24 LAB — MAGNESIUM: Magnesium: 1.9 mg/dL (ref 1.6–2.3)

## 2019-09-27 ENCOUNTER — Telehealth: Payer: Self-pay | Admitting: *Deleted

## 2019-09-27 DIAGNOSIS — I10 Essential (primary) hypertension: Secondary | ICD-10-CM

## 2019-09-27 DIAGNOSIS — R600 Localized edema: Secondary | ICD-10-CM

## 2019-09-27 MED ORDER — POTASSIUM CHLORIDE ER 10 MEQ PO TBCR: 10.0000 meq | EXTENDED_RELEASE_TABLET | Freq: Every day | ORAL | 3 refills | Status: DC

## 2019-09-27 MED ORDER — POTASSIUM CHLORIDE CRYS ER 20 MEQ PO TBCR: 20.0000 meq | EXTENDED_RELEASE_TABLET | Freq: Every day | ORAL | 3 refills | Status: DC

## 2019-09-27 MED ORDER — HYDROCHLOROTHIAZIDE 25 MG PO TABS: 25.0000 mg | ORAL_TABLET | Freq: Every day | ORAL | 3 refills | Status: DC

## 2019-09-27 NOTE — Addendum Note (Signed)
Addended by: Gaetano Net on: 09/27/2019 10:08 AM   Modules accepted: Orders

## 2019-09-27 NOTE — Telephone Encounter (Signed)
-----   Message from Charlie Pitter, [place] sent at 09/26/2019  9:29 AM EST ----- Please let pt know labs are normal. As discussed, given elevated BP readings and edema, would increase HCTZ to 25mg  daily. I would add KCl 41meq back daily and recheck BMET in 7-10 days.

## 2019-09-30 ENCOUNTER — Other Ambulatory Visit: Payer: Self-pay | Admitting: Physician Assistant

## 2019-09-30 DIAGNOSIS — M79604 Pain in right leg: Secondary | ICD-10-CM | POA: Diagnosis not present

## 2019-09-30 DIAGNOSIS — M5432 Sciatica, left side: Secondary | ICD-10-CM | POA: Diagnosis not present

## 2019-09-30 DIAGNOSIS — M545 Low back pain: Secondary | ICD-10-CM | POA: Diagnosis not present

## 2019-09-30 DIAGNOSIS — R600 Localized edema: Secondary | ICD-10-CM

## 2019-09-30 DIAGNOSIS — M79605 Pain in left leg: Secondary | ICD-10-CM

## 2019-10-04 ENCOUNTER — Other Ambulatory Visit: Payer: PPO

## 2019-10-05 ENCOUNTER — Ambulatory Visit (HOSPITAL_COMMUNITY)
Admission: RE | Admit: 2019-10-05 | Payer: PPO | Source: Ambulatory Visit | Attending: Physician Assistant | Admitting: Physician Assistant

## 2019-10-10 ENCOUNTER — Ambulatory Visit: Payer: PPO | Attending: Internal Medicine

## 2019-10-10 DIAGNOSIS — Z20822 Contact with and (suspected) exposure to covid-19: Secondary | ICD-10-CM

## 2019-10-10 DIAGNOSIS — Z20828 Contact with and (suspected) exposure to other viral communicable diseases: Secondary | ICD-10-CM | POA: Diagnosis not present

## 2019-10-11 ENCOUNTER — Other Ambulatory Visit: Payer: PPO | Admitting: *Deleted

## 2019-10-11 ENCOUNTER — Other Ambulatory Visit: Payer: Self-pay

## 2019-10-11 DIAGNOSIS — R600 Localized edema: Secondary | ICD-10-CM | POA: Diagnosis not present

## 2019-10-11 DIAGNOSIS — I10 Essential (primary) hypertension: Secondary | ICD-10-CM

## 2019-10-11 LAB — BASIC METABOLIC PANEL
BUN/Creatinine Ratio: 12 (ref 12–28)
BUN: 8 mg/dL (ref 8–27)
CO2: 25 mmol/L (ref 20–29)
Calcium: 9.1 mg/dL (ref 8.7–10.3)
Chloride: 100 mmol/L (ref 96–106)
Creatinine, Ser: 0.68 mg/dL (ref 0.57–1.00)
GFR calc Af Amer: 101 mL/min/{1.73_m2} (ref >59)
GFR calc non Af Amer: 88 mL/min/{1.73_m2} (ref >59)
Glucose: 109 mg/dL — ABNORMAL HIGH (ref 65–99)
Potassium: 4.3 mmol/L (ref 3.5–5.2)
Sodium: 141 mmol/L (ref 134–144)

## 2019-10-12 ENCOUNTER — Ambulatory Visit (HOSPITAL_COMMUNITY)
Admission: RE | Admit: 2019-10-12 | Discharge: 2019-10-12 | Disposition: A | Discharge status: Home / Self Care | Payer: PPO | Source: Ambulatory Visit | Attending: Cardiovascular Disease | Admitting: Cardiovascular Disease

## 2019-10-12 DIAGNOSIS — M79604 Pain in right leg: Secondary | ICD-10-CM

## 2019-10-12 DIAGNOSIS — R600 Localized edema: Secondary | ICD-10-CM | POA: Diagnosis not present

## 2019-10-12 DIAGNOSIS — M79605 Pain in left leg: Secondary | ICD-10-CM | POA: Diagnosis not present

## 2019-10-12 LAB — NOVEL CORONAVIRUS, NAA: SARS-CoV-2, NAA: NOT DETECTED

## 2019-10-17 NOTE — Progress Notes (Signed)
Virtual Visit via Video Note   This visit type was conducted due to national recommendations for restrictions regarding the COVID-19 Pandemic (e.g. social distancing) in an effort to limit this patient's exposure and mitigate transmission in our community.  Due to her co-morbid illnesses, this patient is at least at moderate risk for complications without adequate follow up.  This format is felt to be most appropriate for this patient at this time.  All issues noted in this document were discussed and addressed.  A limited physical exam was performed with this format.  Please refer to the patient's chart for her consent to telehealth for [facility].   Date:  10/18/2019   ID:  Jessica Watts, DOB [birth year], MRN [id number]  Patient Location: Home Provider Location: Home  PCP:  Kelton Pillar, MD  Cardiologist:  Fransico Him, MD   Electrophysiologist:  None   Evaluation Performed:  Follow-Up Visit  Chief Complaint:  F/u leg pain  History of Present Illness:    Jessica Watts is a 74 y.o. female with history of chest pain, normal cardiac catheterization after false positive nuclear stress test, history of SVT status post AVNRT ablation by Dr. Lovena Le 2017, OSA on CPAP DM, HLD, obesity chronic lower extremity edema managed with HCTZ.  Patient had an office visit with Melina Copa, PA-C 09/23/2019 complaining of bilateral leg pain for several months.  PCP suspected neuropathy.  Arterial Dopplers 10/12/2019 were normal as were labs including be met, CK, folate, B12, TSH and glucose.  Patient still has leg pain left great than right. She thinks she has sciatica. Does exercises given to her by PT and walking in her home.    The patient does not have symptoms concerning for COVID-19 infection (fever, chills, cough, or new shortness of breath).    Past Medical History:  Diagnosis Date  . Arthritis   . Asthma   . Complication of anesthesia    has difficulties related to being  Native American; multiple days in hospital after hysterectomy years ago  . COPD (chronic obstructive pulmonary disease) ([place])   . Depression    in the past  . Fibromyalgia   . GERD (gastroesophageal reflux disease)   . Headache(784.0)    migraines  . Hyperlipidemia   . Hypertension   . Lupus ([place])   . Macular degeneration    left eye  . Migraine   . Neuropathy   . OSA (obstructive sleep apnea) 04/22/2016   Moderate with AHI 16/hr on CPAP  . Peptic ulcer disease   . Restless legs   . SVT (supraventricular tachycardia) ([place]) 12/03/2015   ablation for SVT 2017   Past Surgical History:  Procedure Laterality Date  . ABDOMINAL HYSTERECTOMY    . ABLATION OF DYSRHYTHMIC FOCUS  12/03/2015   SVT  . ANTERIOR CERVICAL DECOMP/DISCECTOMY FUSION  07/20/2012   Procedure: ANTERIOR CERVICAL DECOMPRESSION/DISCECTOMY FUSION 2 LEVELS;  Surgeon: Charlie Pitter, MD;  Location: [place];  Service: Neurosurgery;  Laterality: N/A;  cervical three- four , four -  five  . APPENDECTOMY    . BACK SURGERY     lumbar fusion  . CARDIAC CATHETERIZATION N/A 09/14/2015   Procedure: Left Heart Cath and Coronary Angiography;  Surgeon: Sherren Mocha, MD;  Location: [facility];  Service: Cardiovascular;  Laterality: N/A;  . CARPAL TUNNEL RELEASE     bilat  . CERVICAL FUSION     C2/3  . ELECTROPHYSIOLOGIC STUDY N/A 12/03/2015   Procedure: SVT Ablation;  Surgeon: Evans Lance, MD;  Location: [facility];  Service: Cardiovascular;  Laterality: N/A;  . EYE SURGERY Bilateral    cataract surgery with lens implant  . KNEE ARTHROSCOPY Left   . NEUROMA SURGERY     L foot  . SHOULDER SURGERY    . TONSILLECTOMY    . TOTAL KNEE ARTHROPLASTY Left 10/01/2016   Procedure: LEFT TOTAL KNEE ARTHROPLASTY;  Surgeon: Marybelle Killings, MD;  Location: [facility];  Service: Orthopedics;  Laterality: Left;  . ULNAR NERVE TRANSPOSITION  07/20/2012   Procedure: ULNAR NERVE DECOMPRESSION/TRANSPOSITION;  Surgeon: Charlie Pitter,  MD;  Location: [facility];  Service: Neurosurgery;  Laterality: Left;     Current Meds  Medication Sig  . albuterol (PROAIR HFA) 108 (90 BASE) MCG/ACT inhaler Inhale 2 puffs into the lungs every 4 (four) hours as needed for wheezing or shortness of breath.   Marland Kitchen aspirin EC 81 MG tablet Take 81 mg by mouth daily.  . calcium carbonate (OS-CAL - DOSED IN MG OF ELEMENTAL CALCIUM) 1250 (500 Ca) MG tablet Take 1 tablet by mouth daily.  . chlorpheniramine (CHLOR-TRIMETON) 4 MG tablet Take 4 mg by mouth daily.  . cholecalciferol (VITAMIN D) 1000 units tablet Take 1,000 Units by mouth daily.  . fluticasone (FLONASE) 50 MCG/ACT nasal spray Place 1 spray into both nostrils daily as needed for allergies. For nasal congestion  . hydrochlorothiazide (HYDRODIURIL) 25 MG tablet Take 1 tablet (25 mg total) by mouth daily.  Marland Kitchen losartan (COZAAR) 100 MG tablet Take 100 mg by mouth daily.  Marland Kitchen omeprazole (PRILOSEC) 40 MG capsule Take 40 mg by mouth daily.  . potassium chloride SA (KLOR-CON) 20 MEQ tablet Take 1 tablet (20 mEq total) by mouth daily.     Allergies:   Ace inhibitors, Amoxicillin, and Sulfa antibiotics   Social History   Tobacco Use  . Smoking status: Never Smoker  . Smokeless tobacco: Never Used  Substance Use Topics  . Alcohol use: Yes    Comment: occasional wine  . Drug use: No     Family Hx: The patient's family history includes Breast cancer in her sister and sister; Heart attack in her father and mother; Heart disease in her father and mother; Hypertension in her father and mother.  ROS:   Please see the history of present illness.      All other systems reviewed and are negative.   Prior CV studies:   The following studies were reviewed today:  Arterial Dopplers 10/12/2019 Summary: Right: Resting right ankle-brachial index is within normal range. No evidence of significant right lower extremity arterial disease. The right toe-brachial index is abnormal.   Left: Resting left  ankle-brachial index is within normal range. No evidence of significant left lower extremity arterial disease. The left toe-brachial index is abnormal.     Labs/Other Tests and Data Reviewed:    EKG:  No ECG reviewed.  Recent Labs: 09/23/2019: Magnesium 1.9; TSH 2.350 10/11/2019: BUN 8; Creatinine, Ser 0.68; Potassium 4.3; Sodium 141   Recent Lipid Panel No results found for: CHOL, TRIG, HDL, CHOLHDL, LDLCALC, LDLDIRECT  Wt Readings from Last 3 Encounters:  10/18/19 227 lb (103 kg)  09/23/19 231 lb 12.8 oz (105.1 kg)  07/06/19 219 lb (99.3 kg)     Objective:    Vital Signs:  BP 134/69   Pulse 72   Ht 5' 0.05" (1.525 m)   Wt 227 lb (103 kg)   BMI 44.26 kg/m  VITAL SIGNS:  reviewed GEN:  no acute distress RESPIRATORY:  normal respiratory effort, symmetric expansion CARDIOVASCULAR:  lower extremity edema noted-mild  ASSESSMENT & PLAN:    1. Leg pain with normal arterial Dopplers.  Last office visit was recommended she see her rheumatologist for further evaluation. She thinks it's sciatica 2. History of SVT status post AVNRT ablation 2017 3. Essential hypertension BP controlled 4. OSA on CPAP 5. Obesity-exercise 150 min/week and diet recommended 6. Chronic lower extremity edema-doing better. Watches salt closely  COVID-19 Education: The signs and symptoms of COVID-19 were discussed with the patient and how to seek care for testing (follow up with PCP or arrange E-visit).   The importance of social distancing was discussed today.  Time:   Today, I have spent 8 minutes with the patient with telehealth technology discussing the above problems.     Medication Adjustments/Labs and Tests Ordered: Current medicines are reviewed at length with the patient today.  Concerns regarding medicines are outlined above.   Tests Ordered: No orders of the defined types were placed in this encounter.   Medication Changes: No orders of the defined types were placed in this  encounter.   Follow Up:  Either In Person or Virtual in 4 month(s) Dr. Radford Pax  Signed, Ermalinda Barrios, PA-C  10/18/2019 8:53 AM    [place]

## 2019-10-18 ENCOUNTER — Encounter: Payer: Self-pay | Admitting: Physician Assistant

## 2019-10-18 ENCOUNTER — Other Ambulatory Visit: Payer: Self-pay

## 2019-10-18 ENCOUNTER — Telehealth (INDEPENDENT_AMBULATORY_CARE_PROVIDER_SITE_OTHER): Payer: PPO | Admitting: Physician Assistant

## 2019-10-18 VITALS — BP 134/69 | HR 72 | Ht 60.05 in | Wt 227.0 lb

## 2019-10-18 DIAGNOSIS — Z9989 Dependence on other enabling machines and devices: Secondary | ICD-10-CM | POA: Diagnosis not present

## 2019-10-18 DIAGNOSIS — G4733 Obstructive sleep apnea (adult) (pediatric): Secondary | ICD-10-CM

## 2019-10-18 DIAGNOSIS — I471 Supraventricular tachycardia: Secondary | ICD-10-CM

## 2019-10-18 DIAGNOSIS — M79605 Pain in left leg: Secondary | ICD-10-CM

## 2019-10-18 DIAGNOSIS — M79604 Pain in right leg: Secondary | ICD-10-CM

## 2019-10-18 DIAGNOSIS — R600 Localized edema: Secondary | ICD-10-CM

## 2019-10-18 DIAGNOSIS — I10 Essential (primary) hypertension: Secondary | ICD-10-CM

## 2019-10-18 NOTE — Patient Instructions (Signed)
Medication Instructions:  No changes *If you need a refill on your cardiac medications before your next appointment, please call your pharmacy*  Lab Work: none If you have labs (blood work) drawn today and your tests are completely normal, you will receive your results only by: Marland Kitchen MyChart Message (if you have MyChart) OR . A paper copy in the mail If you have any lab test that is abnormal or we need to change your treatment, we will call you to review the results.  Testing/Procedures: none  Follow-Up: At [facility], you and your health needs are our priority.  As part of our continuing mission to provide you with exceptional heart care, we have created designated Provider Care Teams.  These Care Teams include your primary Cardiologist (physician) and Advanced Practice Providers (APPs -  Physician Assistants and Nurse Practitioners) who all work together to provide you with the care you need, when you need it.  Your next appointment:   4 month(s)  The format for your next appointment:   Either In Person or Virtual  Provider:   You may see Fransico Him, MD or one of the following Advanced Practice Providers on your designated Care Team:    Melina Copa, PA-C  Ermalinda Barrios, PA-C   Other Instructions Your provider discussed the importance of regular exercise and recommended that you start or continue a regular exercise program for good health with a goal of 150 minutes per week.  Follow up with Dr. Radford Pax in 4-5 months.  This can be virtual or an in office visit.

## 2019-11-01 ENCOUNTER — Other Ambulatory Visit: Payer: Self-pay | Admitting: Family Medicine

## 2019-11-01 DIAGNOSIS — Z1231 Encounter for screening mammogram for malignant neoplasm of breast: Secondary | ICD-10-CM

## 2019-12-07 ENCOUNTER — Ambulatory Visit
Admission: RE | Admit: 2019-12-07 | Discharge: 2019-12-07 | Disposition: A | Discharge status: Home / Self Care | Payer: PPO | Source: Ambulatory Visit | Attending: Family Medicine | Admitting: Family Medicine

## 2019-12-07 ENCOUNTER — Other Ambulatory Visit: Payer: Self-pay

## 2019-12-07 DIAGNOSIS — Z1231 Encounter for screening mammogram for malignant neoplasm of breast: Secondary | ICD-10-CM

## 2020-01-10 DIAGNOSIS — Z1389 Encounter for screening for other disorder: Secondary | ICD-10-CM | POA: Diagnosis not present

## 2020-01-10 DIAGNOSIS — E7800 Pure hypercholesterolemia, unspecified: Secondary | ICD-10-CM | POA: Diagnosis not present

## 2020-01-10 DIAGNOSIS — J309 Allergic rhinitis, unspecified: Secondary | ICD-10-CM | POA: Diagnosis not present

## 2020-01-10 DIAGNOSIS — Z0000 Encounter for general adult medical examination without abnormal findings: Secondary | ICD-10-CM | POA: Diagnosis not present

## 2020-01-10 DIAGNOSIS — M1990 Unspecified osteoarthritis, unspecified site: Secondary | ICD-10-CM | POA: Diagnosis not present

## 2020-01-10 DIAGNOSIS — M5430 Sciatica, unspecified side: Secondary | ICD-10-CM | POA: Diagnosis not present

## 2020-01-10 DIAGNOSIS — I10 Essential (primary) hypertension: Secondary | ICD-10-CM | POA: Diagnosis not present

## 2020-01-10 DIAGNOSIS — Z1159 Encounter for screening for other viral diseases: Secondary | ICD-10-CM | POA: Diagnosis not present

## 2020-01-10 DIAGNOSIS — M797 Fibromyalgia: Secondary | ICD-10-CM | POA: Diagnosis not present

## 2020-01-10 DIAGNOSIS — K219 Gastro-esophageal reflux disease without esophagitis: Secondary | ICD-10-CM | POA: Diagnosis not present

## 2020-01-10 DIAGNOSIS — D649 Anemia, unspecified: Secondary | ICD-10-CM | POA: Diagnosis not present

## 2020-01-10 DIAGNOSIS — R7303 Prediabetes: Secondary | ICD-10-CM | POA: Diagnosis not present

## 2020-03-12 DIAGNOSIS — S93421A Sprain of deltoid ligament of right ankle, initial encounter: Secondary | ICD-10-CM | POA: Diagnosis not present

## 2020-03-22 DIAGNOSIS — M722 Plantar fascial fibromatosis: Secondary | ICD-10-CM | POA: Diagnosis not present

## 2020-03-22 DIAGNOSIS — S93421D Sprain of deltoid ligament of right ankle, subsequent encounter: Secondary | ICD-10-CM | POA: Diagnosis not present

## 2020-03-28 DIAGNOSIS — M79671 Pain in right foot: Secondary | ICD-10-CM | POA: Diagnosis not present

## 2020-03-28 DIAGNOSIS — M25571 Pain in right ankle and joints of right foot: Secondary | ICD-10-CM | POA: Diagnosis not present

## 2020-03-30 DIAGNOSIS — G4733 Obstructive sleep apnea (adult) (pediatric): Secondary | ICD-10-CM | POA: Diagnosis not present

## 2020-04-04 DIAGNOSIS — M79671 Pain in right foot: Secondary | ICD-10-CM | POA: Diagnosis not present

## 2020-04-04 DIAGNOSIS — M25571 Pain in right ankle and joints of right foot: Secondary | ICD-10-CM | POA: Diagnosis not present

## 2020-04-12 DIAGNOSIS — M25571 Pain in right ankle and joints of right foot: Secondary | ICD-10-CM | POA: Diagnosis not present

## 2020-04-12 DIAGNOSIS — M79671 Pain in right foot: Secondary | ICD-10-CM | POA: Diagnosis not present

## 2020-04-25 DIAGNOSIS — M25571 Pain in right ankle and joints of right foot: Secondary | ICD-10-CM | POA: Diagnosis not present

## 2020-04-25 DIAGNOSIS — M79671 Pain in right foot: Secondary | ICD-10-CM | POA: Diagnosis not present

## 2020-05-02 DIAGNOSIS — M79671 Pain in right foot: Secondary | ICD-10-CM | POA: Diagnosis not present

## 2020-05-02 DIAGNOSIS — M25571 Pain in right ankle and joints of right foot: Secondary | ICD-10-CM | POA: Diagnosis not present

## 2020-05-08 DIAGNOSIS — M25571 Pain in right ankle and joints of right foot: Secondary | ICD-10-CM | POA: Diagnosis not present

## 2020-05-08 DIAGNOSIS — M79671 Pain in right foot: Secondary | ICD-10-CM | POA: Diagnosis not present

## 2020-05-10 ENCOUNTER — Encounter: Payer: Self-pay | Admitting: Cardiology

## 2020-05-10 ENCOUNTER — Ambulatory Visit: Payer: PPO | Admitting: Cardiology

## 2020-05-10 ENCOUNTER — Other Ambulatory Visit: Payer: Self-pay

## 2020-05-10 VITALS — BP 136/76 | HR 96 | Ht 60.0 in | Wt 231.0 lb

## 2020-05-10 DIAGNOSIS — R600 Localized edema: Secondary | ICD-10-CM | POA: Diagnosis not present

## 2020-05-10 DIAGNOSIS — G4733 Obstructive sleep apnea (adult) (pediatric): Secondary | ICD-10-CM

## 2020-05-10 DIAGNOSIS — I471 Supraventricular tachycardia: Secondary | ICD-10-CM | POA: Diagnosis not present

## 2020-05-10 DIAGNOSIS — I10 Essential (primary) hypertension: Secondary | ICD-10-CM

## 2020-05-10 NOTE — Patient Instructions (Signed)
Use nasal saline spray two sprays twice daily for nasal congestion.   Medication Instructions:  Your physician recommends that you continue on your current medications as directed. Please refer to the Current Medication list given to you today.  *If you need a refill on your cardiac medications before your next appointment, please call your pharmacy*   Follow-Up: At [facility], you and your health needs are our priority.  As part of our continuing mission to provide you with exceptional heart care, we have created designated Provider Care Teams.  These Care Teams include your primary Cardiologist (physician) and Advanced Practice Providers (APPs -  Physician Assistants and Nurse Practitioners) who all work together to provide you with the care you need, when you need it.  Your next appointment:   1 year(s)  The format for your next appointment:   In Person  Provider:   You may see Fransico Him, MD or one of the following Advanced Practice Providers on your designated Care Team:

## 2020-05-10 NOTE — Progress Notes (Signed)
Cardiology Office Note:    Date:  05/10/2020   ID:  Jessica Watts, DOB 10-Apr-1946, MRN [id number]  PCP:  Kelton Pillar, MD  Cardiologist:  Fransico Him, MD    Referring MD: Kelton Pillar, MD   Chief Complaint  Patient presents with   Follow-up    SVT, OSA, HTN, LE edema    History of Present Illness:    Jessica Watts is a 74 y.o. female with a hx of chest pain and shortness of breath with cath done for falls positive nuclear stress test showing normal coronary arteries and normal LVEDP.  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    She also has OSA with an AHI of 16.0/h and was on CPAP at 14 cm H2O.    SHe is here today for followup and is doing well.  She denies any chest pain or pressure, , PND, orthopnea, LE edema, dizziness, palpitations or syncope. She has chronic dyspnea on exertion with extreme walking that is stable..  She injured her ankle in may and sometimes at the end of the day has some puffiness in that ankle.  She is compliant with her meds and is tolerating meds with no SE.    She was doing well with her CPAP device up until allergy season and then had such a congested nose that she had to stop using it.  Prior to that she tolerated the mask and felt the pressure is adequate. She does have problems with dry mouth and uses a nasal mask.     Past Medical History:  Diagnosis Date   Arthritis    Asthma    Complication of anesthesia    has difficulties related to being Native American; multiple days in hospital after hysterectomy years ago   COPD (chronic obstructive pulmonary disease) ([place])    Depression    in the past   Fibromyalgia    GERD (gastroesophageal reflux disease)    Headache(784.0)    migraines   Hyperlipidemia    Hypertension    Lupus ([place])    Macular degeneration    left eye   Migraine    Neuropathy    OSA (obstructive sleep apnea) 04/22/2016   Moderate with AHI 16/hr on CPAP   Peptic  ulcer disease    Restless legs    SVT (supraventricular tachycardia) ([place]) 12/03/2015   ablation for SVT 2017    Past Surgical History:  Procedure Laterality Date   ABDOMINAL HYSTERECTOMY     ABLATION OF DYSRHYTHMIC FOCUS  12/03/2015   SVT   ANTERIOR CERVICAL DECOMP/DISCECTOMY FUSION  07/20/2012   Procedure: ANTERIOR CERVICAL DECOMPRESSION/DISCECTOMY FUSION 2 LEVELS;  Surgeon: Charlie Pitter, MD;  Location: [facility];  Service: Neurosurgery;  Laterality: N/A;  cervical three- four , four -  five   APPENDECTOMY     BACK SURGERY     lumbar fusion   CARDIAC CATHETERIZATION N/A 09/14/2015   Procedure: Left Heart Cath and Coronary Angiography;  Surgeon: Sherren Mocha, MD;  Location: [place] [facility];  Service: Cardiovascular;  Laterality: N/A;   CARPAL TUNNEL RELEASE     bilat   CERVICAL FUSION     C2/3   ELECTROPHYSIOLOGIC STUDY N/A 12/03/2015   Procedure: SVT Ablation;  Surgeon: Evans Lance, MD;  Location: [place] [facility];  Service: Cardiovascular;  Laterality: N/A;   EYE SURGERY Bilateral    cataract surgery with lens implant   KNEE ARTHROSCOPY Left  NEUROMA SURGERY     L foot   SHOULDER SURGERY     TONSILLECTOMY     TOTAL KNEE ARTHROPLASTY Left 10/01/2016   Procedure: LEFT TOTAL KNEE ARTHROPLASTY;  Surgeon: Marybelle Killings, MD;  Location: [place];  Service: Orthopedics;  Laterality: Left;   ULNAR NERVE TRANSPOSITION  07/20/2012   Procedure: ULNAR NERVE DECOMPRESSION/TRANSPOSITION;  Surgeon: Charlie Pitter, MD;  Location: [place] [facility];  Service: Neurosurgery;  Laterality: Left;    Current Medications: Current Meds  Medication Sig   albuterol (PROAIR HFA) 108 (90 BASE) MCG/ACT inhaler Inhale 2 puffs into the lungs every 4 (four) hours as needed for wheezing or shortness of breath.    aspirin EC 81 MG tablet Take 81 mg by mouth daily.   calcium carbonate (OS-CAL - DOSED IN MG OF ELEMENTAL CALCIUM) 1250 (500 Ca) MG tablet Take 1 tablet by mouth daily.    chlorpheniramine (CHLOR-TRIMETON) 4 MG tablet Take 4 mg by mouth daily.   cholecalciferol (VITAMIN D) 1000 units tablet Take 1,000 Units by mouth daily.   fluticasone (FLONASE) 50 MCG/ACT nasal spray Place 1 spray into both nostrils daily as needed for allergies. For nasal congestion   hydrochlorothiazide (HYDRODIURIL) 25 MG tablet Take 1 tablet (25 mg total) by mouth daily.   losartan (COZAAR) 100 MG tablet Take 100 mg by mouth daily.   omeprazole (PRILOSEC) 40 MG capsule Take 40 mg by mouth daily.   potassium chloride SA (KLOR-CON) 20 MEQ tablet Take 1 tablet (20 mEq total) by mouth daily.     Allergies:   Ace inhibitors, Amoxicillin, and Sulfa antibiotics   Social History   Socioeconomic History   Marital status: Divorced    Spouse name: Not on file   Number of children: Not on file   Years of education: Not on file   Highest education level: Not on file  Occupational History   Not on file  Tobacco Use   Smoking status: Never Smoker   Smokeless tobacco: Never Used  Vaping Use   Vaping Use: Never used  Substance and Sexual Activity   Alcohol use: Yes    Comment: occasional wine   Drug use: No   Sexual activity: Not on file  Other Topics Concern   Not on file  Social History Narrative   Not on file   Social Determinants of Health   Financial Resource Strain:    Difficulty of Paying Living Expenses:   Food Insecurity:    Worried About Charity fundraiser in the Last Year:    Arboriculturist in the Last Year:   Transportation Needs:    Film/video editor (Medical):    Lack of Transportation (Non-Medical):   Physical Activity:    Days of Exercise per Week:    Minutes of Exercise per Session:   Stress:    Feeling of Stress :   Social Connections:    Frequency of Communication with Friends and Family:    Frequency of Social Gatherings with Friends and Family:    Attends Religious Services:    Active Member of Clubs or  Organizations:    Attends Archivist Meetings:    Marital Status:      Family History: The patient's family history includes Breast cancer in her sister and sister; Heart attack in her father and mother; Heart disease in her father and mother; Hypertension in her father and mother.  ROS:   Please see the history of present illness.  ROS  All other systems reviewed and negative.   EKGs/Labs/Other Studies Reviewed:    The following studies were reviewed today: PAP download  EKG:  EKG is not ordered today.    Recent Labs: 09/23/2019: Magnesium 1.9; TSH 2.350 10/11/2019: BUN 8; Creatinine, Ser 0.68; Potassium 4.3; Sodium 141   Recent Lipid Panel No results found for: CHOL, TRIG, HDL, CHOLHDL, VLDL, LDLCALC, LDLDIRECT  Physical Exam:    VS:  BP (!) 136/76 (BP Location: Right Arm, Patient Position: Sitting, Cuff Size: Large)    Pulse 96    Ht 5' (1.524 m)    Wt (!) 231 lb (104.8 kg)    PF 96 L/min    BMI 45.11 kg/m     Wt Readings from Last 3 Encounters:  05/10/20 (!) 231 lb (104.8 kg)  10/18/19 227 lb (103 kg)  09/23/19 231 lb 12.8 oz (105.1 kg)     GEN:  Well nourished, well developed in no acute distress HEENT: Normal NECK: No JVD; No carotid bruits LYMPHATICS: No lymphadenopathy CARDIAC: RRR, no murmurs, rubs, gallops RESPIRATORY:  Clear to auscultation without rales, wheezing or rhonchi  ABDOMEN: Soft, non-tender, non-distended MUSCULOSKELETAL:  No edema; No deformity  SKIN: Warm and dry NEUROLOGIC:  Alert and oriented x 3 PSYCHIATRIC:  Normal affect   ASSESSMENT:    1. SVT (supraventricular tachycardia) ([place])   2. Essential (primary) hypertension   3. OSA (obstructive sleep apnea)   4. Lower extremity edema    PLAN:    In order of problems listed above:  1.  SVT  -s/p remote AVNRT ablation in 2017.   -she has not had any palpitations since I saw her last  2.  Hypertension  -BP is well controlled -continue HCTZ 25mg  daily and Losartan  100mg  daily -SCr was 0.73 in March 2021  3.  OSA - the patient is tolerating PAP therapy well without any problems except when she has allergy flares. The PAP download was reviewed today and showed an AHI of 0.6/hr on 14 cm H2O with 78% compliance in using more than 4 hours nightly.  The patient has been using and benefiting from PAP use and will continue to benefit from therapy.  -I have recommended using nasal saline spray 2 sprays twice daily for nasal congestion -continue on Zyrtec for allergies -add chin strap for dry mouth  4.  LE edema  -edema is controlled on low Na diet and diuretic    Medication Adjustments/Labs and Tests Ordered: Current medicines are reviewed at length with the patient today.  Concerns regarding medicines are outlined above.  No orders of the defined types were placed in this encounter.  No orders of the defined types were placed in this encounter.   Signed, Fransico Him, MD  05/10/2020 2:37 PM    [place]

## 2020-05-11 ENCOUNTER — Telehealth: Payer: Self-pay | Admitting: *Deleted

## 2020-05-11 DIAGNOSIS — G4733 Obstructive sleep apnea (adult) (pediatric): Secondary | ICD-10-CM

## 2020-05-11 NOTE — Telephone Encounter (Signed)
Order placed to [facility] via community message. 

## 2020-05-11 NOTE — Telephone Encounter (Signed)
-----   Message from Antonieta Iba, RN sent at 05/10/2020  2:47 PM EDT ----- Per Dr. Radford Pax please order a new chin strap. Thanks!

## 2020-05-21 DIAGNOSIS — M25571 Pain in right ankle and joints of right foot: Secondary | ICD-10-CM | POA: Diagnosis not present

## 2020-05-21 DIAGNOSIS — M79671 Pain in right foot: Secondary | ICD-10-CM | POA: Diagnosis not present

## 2020-05-23 DIAGNOSIS — M25571 Pain in right ankle and joints of right foot: Secondary | ICD-10-CM | POA: Diagnosis not present

## 2020-05-23 DIAGNOSIS — M79671 Pain in right foot: Secondary | ICD-10-CM | POA: Diagnosis not present

## 2020-05-28 DIAGNOSIS — M79671 Pain in right foot: Secondary | ICD-10-CM | POA: Diagnosis not present

## 2020-05-28 DIAGNOSIS — M25571 Pain in right ankle and joints of right foot: Secondary | ICD-10-CM | POA: Diagnosis not present

## 2020-05-29 DIAGNOSIS — M329 Systemic lupus erythematosus, unspecified: Secondary | ICD-10-CM | POA: Diagnosis not present

## 2020-05-29 DIAGNOSIS — Z961 Presence of intraocular lens: Secondary | ICD-10-CM | POA: Diagnosis not present

## 2020-05-29 DIAGNOSIS — H43813 Vitreous degeneration, bilateral: Secondary | ICD-10-CM | POA: Diagnosis not present

## 2020-06-06 DIAGNOSIS — M79671 Pain in right foot: Secondary | ICD-10-CM | POA: Diagnosis not present

## 2020-06-06 DIAGNOSIS — M25571 Pain in right ankle and joints of right foot: Secondary | ICD-10-CM | POA: Diagnosis not present

## 2020-07-12 DIAGNOSIS — I10 Essential (primary) hypertension: Secondary | ICD-10-CM | POA: Diagnosis not present

## 2020-07-12 DIAGNOSIS — E7800 Pure hypercholesterolemia, unspecified: Secondary | ICD-10-CM | POA: Diagnosis not present

## 2020-07-12 DIAGNOSIS — Z23 Encounter for immunization: Secondary | ICD-10-CM | POA: Diagnosis not present

## 2020-07-12 DIAGNOSIS — R7303 Prediabetes: Secondary | ICD-10-CM | POA: Diagnosis not present

## 2020-07-16 DIAGNOSIS — M797 Fibromyalgia: Secondary | ICD-10-CM | POA: Diagnosis not present

## 2020-07-16 DIAGNOSIS — G894 Chronic pain syndrome: Secondary | ICD-10-CM | POA: Diagnosis not present

## 2020-09-04 ENCOUNTER — Other Ambulatory Visit: Payer: Self-pay | Admitting: Physician Assistant

## 2020-12-05 ENCOUNTER — Other Ambulatory Visit: Payer: Self-pay | Admitting: Cardiology

## 2020-12-05 NOTE — Telephone Encounter (Signed)
Pt's pharmacy is requesting a refill on potassium 10 meq. Pt has 2 strengths of potassium on med list. Please clarify which strength pt is supposed to be taking. Please address

## 2021-02-20 ENCOUNTER — Other Ambulatory Visit: Payer: Self-pay | Admitting: Family Medicine

## 2021-02-20 DIAGNOSIS — Z1231 Encounter for screening mammogram for malignant neoplasm of breast: Secondary | ICD-10-CM

## 2021-04-05 ENCOUNTER — Other Ambulatory Visit: Payer: Self-pay

## 2021-04-05 MED ORDER — POTASSIUM CHLORIDE ER 10 MEQ PO TBCR: 10.0000 meq | EXTENDED_RELEASE_TABLET | Freq: Every day | ORAL | 0 refills | Status: DC

## 2021-04-23 ENCOUNTER — Ambulatory Visit: Payer: PPO

## 2021-05-08 ENCOUNTER — Telehealth: Payer: Self-pay | Admitting: *Deleted

## 2021-05-08 NOTE — Telephone Encounter (Signed)
   [facility] Pre-operative Risk Assessment    Patient Name: Jessica Watts  DOB: 02/28/1946 MRN: [id number]  HEARTCARE STAFF:  - IMPORTANT!!!!!! Under Visit Info/Reason for Call, type in Other and utilize the format Clearance MM/DD/YY or Clearance TBD. Do not use dashes or single digits. - Please review there is not already an duplicate clearance open for this procedure. - If request is for dental extraction, please clarify the # of teeth to be extracted. - If the patient is currently at the dentist's office, call Pre-Op Callback Staff (MA/nurse) to input urgent request.  - If the patient is not currently in the dentist office, please route to the Pre-Op pool.  Request for surgical clearance:  What type of surgery is being performed?  URETEROSCOPY & EVENTUAL  NEPHRECTOMY  When is this surgery scheduled?  05/23/2021  What type of clearance is required (medical clearance vs. Pharmacy clearance to hold med vs. Both)?   BOTH  Are there any medications that need to be held prior to surgery and how long?  ASPIRIN  Practice name and name of physician performing surgery?  [facility] / DR. Dillard Cannon   What is the office phone number?  [phone]   7.   What is the office fax number?  [phone]  8.   Anesthesia type (None, local, MAC, general) ?     Jeanann Lewandowsky 05/08/2021, 8:41 AM  _________________________________________________________________   (provider comments below)'

## 2021-05-09 NOTE — Telephone Encounter (Signed)
Pt has appt with Dr. Radford Pax on 05/28/21. Will send surgeon's office FYI pt has appt 05/28/21.

## 2021-05-09 NOTE — Telephone Encounter (Signed)
Primary Cardiologist:Traci Turner, MD  Chart reviewed as part of pre-operative protocol coverage. Because of Jessica Watts's past medical history and time since last visit, he/she will require a follow-up visit in order to better assess preoperative cardiovascular risk.  Pre-op covering staff: - Please schedule appointment and call patient to inform them. - Please contact requesting surgeon's office via preferred method (i.e, phone, fax) to inform them of need for appointment prior to surgery.  If applicable, this message will also be routed to pharmacy pool and/or primary cardiologist for input on holding anticoagulant/antiplatelet agent as requested below so that this information is available at time of patient's appointment.   Deberah Pelton, NP  05/09/2021, 8:46 AM

## 2021-05-24 ENCOUNTER — Telehealth: Payer: Self-pay | Admitting: Cardiology

## 2021-05-24 NOTE — Telephone Encounter (Signed)
Patient is scheduled for an appointment Tuesday 05/28/21. She lives with her Daughter who tested positive for COVID 8.11.22. Her Daughter has self isolated and she doesn't have any symptoms yet. She wanted to know if she is still allowed to come to her appointment  Please advise

## 2021-05-24 NOTE — Telephone Encounter (Signed)
Adv pt to continue to isolate from her daughter, test on Tues am before her appointment.  Adv if she develops symptoms or tests +she may need to reschedule the appointment.  Aware I am forwarding to Dr. Radford Pax who may make a different decision and if she she will be notified.  This is a yearly visit in addition it is a PRE OP Clearance.  Her UROLOGY surgery was to be 8/11 but has been moved to 8/30 due to needing the cardiac clearance.  There are no openings w Dr. Radford Pax before that date.

## 2021-05-27 NOTE — Telephone Encounter (Signed)
Spoke with the patient and advised her that Dr. Radford Pax would prefer that she move her appointment out until next week. I have rescheduled her appointment

## 2021-05-28 ENCOUNTER — Ambulatory Visit: Payer: Medicare Other | Admitting: Cardiology

## 2021-05-28 ENCOUNTER — Ambulatory Visit: Payer: PPO | Admitting: Cardiology

## 2021-05-31 DIAGNOSIS — M329 Systemic lupus erythematosus, unspecified: Secondary | ICD-10-CM | POA: Diagnosis not present

## 2021-05-31 DIAGNOSIS — H43813 Vitreous degeneration, bilateral: Secondary | ICD-10-CM | POA: Diagnosis not present

## 2021-05-31 DIAGNOSIS — Z961 Presence of intraocular lens: Secondary | ICD-10-CM | POA: Diagnosis not present

## 2021-06-04 NOTE — Progress Notes (Signed)
Cardiology Office Note   Date:  06/05/2021   ID:  Jessica Watts, DOB [birth year], MRN [id number]  PCP:  Kelton Pillar, MD    No chief complaint on file.  SVT, prep clearance  Wt Readings from Last 3 Encounters:  06/05/21 216 lb 9.6 oz (98.2 kg)  05/10/20 (!) 231 lb (104.8 kg)  10/18/19 227 lb (103 kg)       History of Present Illness: Jessica Watts is a 75 y.o. female   with a hx of chest pain and shortness of breath with cath done for false positive nuclear stress test showing normal coronary arteries and normal LVEDP, in 2016.  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    She also has OSA with an AHI of 16.0/h and was on CPAP at 14 cm H2O.    She has urologic surgery on 8/30 and needs preop cardiovascular exam.  She has a ureteral mass.    She was doing very well until 03/2021 when she had urosepsis and was hospitalized at [facility]    Prior to June, she was exercising regularly without symptoms.    Her most strenuous activity now is walking.  She has some DOE with walking a longer distance. No chest pain.  She has not fully gotten her stamina back after the hospital stay.   Denies : Chest pain. Dizziness. Leg edema. Nitroglycerin use. Orthopnea. Palpitations. Paroxysmal nocturnal dyspnea. Syncope.    Past Medical History:  Diagnosis Date   Arthritis    Asthma    Complication of anesthesia    has difficulties related to being Native American; multiple days in hospital after hysterectomy years ago   COPD (chronic obstructive pulmonary disease) ([place])    Depression    in the past   Fibromyalgia    GERD (gastroesophageal reflux disease)    Headache(784.0)    migraines   Hyperlipidemia    Hypertension    Lupus ([place])    Macular degeneration    left eye   Migraine    Neuropathy    OSA (obstructive sleep apnea) 04/22/2016   Moderate with AHI 16/hr on CPAP   Peptic ulcer disease    Restless legs    SVT (supraventricular  tachycardia) ([place]) 12/03/2015   ablation for SVT 2017    Past Surgical History:  Procedure Laterality Date   ABDOMINAL HYSTERECTOMY     ABLATION OF DYSRHYTHMIC FOCUS  12/03/2015   SVT   ANTERIOR CERVICAL DECOMP/DISCECTOMY FUSION  07/20/2012   Procedure: ANTERIOR CERVICAL DECOMPRESSION/DISCECTOMY FUSION 2 LEVELS;  Surgeon: Charlie Pitter, MD;  Location: [facility];  Service: Neurosurgery;  Laterality: N/A;  cervical three- four , four -  five   APPENDECTOMY     BACK SURGERY     lumbar fusion   CARDIAC CATHETERIZATION N/A 09/14/2015   Procedure: Left Heart Cath and Coronary Angiography;  Surgeon: Sherren Mocha, MD;  Location: [place] [facility];  Service: Cardiovascular;  Laterality: N/A;   CARPAL TUNNEL RELEASE     bilat   CERVICAL FUSION     C2/3   ELECTROPHYSIOLOGIC STUDY N/A 12/03/2015   Procedure: SVT Ablation;  Surgeon: Evans Lance, MD;  Location: [place] [facility];  Service: Cardiovascular;  Laterality: N/A;   EYE SURGERY Bilateral    cataract surgery with lens implant   KNEE ARTHROSCOPY Left    NEUROMA SURGERY     L foot   SHOULDER SURGERY  TONSILLECTOMY     TOTAL KNEE ARTHROPLASTY Left 10/01/2016   Procedure: LEFT TOTAL KNEE ARTHROPLASTY;  Surgeon: Marybelle Killings, MD;  Location: [facility];  Service: Orthopedics;  Laterality: Left;   ULNAR NERVE TRANSPOSITION  07/20/2012   Procedure: ULNAR NERVE DECOMPRESSION/TRANSPOSITION;  Surgeon: Charlie Pitter, MD;  Location: [place] [facility];  Service: Neurosurgery;  Laterality: Left;     Current Outpatient Medications  Medication Sig Dispense Refill   albuterol (VENTOLIN HFA) 108 (90 Base) MCG/ACT inhaler Inhale 2 puffs into the lungs every 4 (four) hours as needed for wheezing or shortness of breath.      aspirin EC 81 MG tablet Take 81 mg by mouth daily.     calcium carbonate (OS-CAL - DOSED IN MG OF ELEMENTAL CALCIUM) 1250 (500 Ca) MG tablet Take 1 tablet by mouth daily.     fluticasone (FLONASE) 50 MCG/ACT nasal spray Place 1 spray  into both nostrils daily as needed for allergies. For nasal congestion     hydrochlorothiazide (HYDRODIURIL) 25 MG tablet Take 1 tablet by mouth once daily 90 tablet 1   losartan (COZAAR) 100 MG tablet Take 100 mg by mouth daily.  5   omeprazole (PRILOSEC) 40 MG capsule Take 40 mg by mouth daily.     potassium chloride (KLOR-CON) 10 MEQ tablet Take 1 tablet (10 mEq total) by mouth daily. Please keep upcoming appt in August 2022 with Dr. Radford Pax before anymore refills. Thank you 90 tablet 0   cetirizine (ZYRTEC) 10 MG tablet Take by mouth.     chlorpheniramine (CHLOR-TRIMETON) 4 MG tablet Take 4 mg by mouth daily. (Patient not taking: Reported on 06/05/2021)     cholecalciferol (VITAMIN D) 1000 units tablet Take 1,000 Units by mouth daily. (Patient not taking: Reported on 06/05/2021)     olmesartan (BENICAR) 20 MG tablet Take 20 mg by mouth daily.     No current facility-administered medications for this visit.    Allergies:   Ace inhibitors, Amoxicillin, and Sulfa antibiotics    Social History:  The patient  reports that she has never smoked. She has never used smokeless tobacco. She reports current alcohol use. She reports that she does not use drugs.   Family History:  The patient's family history includes Breast cancer in her sister and sister; Heart attack in her father and mother; Heart disease in her father and mother; Hypertension in her father and mother.    ROS:  Please see the history of present illness.   Otherwise, review of systems are positive for gradually increasing stamina since her hospitalization for sepsis in June.   All other systems are reviewed and negative.    PHYSICAL EXAM: VS:  BP 126/70   Pulse (!) 105   Ht 5' 0.5" (1.537 m)   Wt 216 lb 9.6 oz (98.2 kg)   SpO2 97%   BMI 41.61 kg/m  , BMI Body mass index is 41.61 kg/m. GEN: Well nourished, well developed, in no acute distress HEENT: normal Neck: no JVD, carotid bruits, or masses Cardiac: RRR; no murmurs,  rubs, or gallops,no edema  Respiratory:  clear to auscultation bilaterally, normal work of breathing GI: soft, nontender, nondistended, + BS MS: no deformity or atrophy Skin: warm and dry, no rash Neuro:  Strength and sensation are intact Psych: euthymic mood, full affect   EKG:   The ekg ordered today demonstrates sinus tach, no ST changes   Recent Labs: No results found for requested labs within last 8760 hours.  Lipid Panel No results found for: CHOL, TRIG, HDL, CHOLHDL, VLDL, LDLCALC, LDLDIRECT   Other studies Reviewed: Additional studies/ records that were reviewed today with results demonstrating: prior cath showing no CAD.   ASSESSMENT AND PLAN:  Preoperative cardiovascular exam: Dr. Dillard Cannon [phone]) [phone] to perform surgery at [facility]. No ischemic testing needed at this time.  WOuld plan for echo as noted below, but I think she will tolerate surgery well from a cardiac standpoint.  No modifiable risk factors at this time to reduce risk.  Bilateral Leg edema: Elevate legs.  Can try compression stockings as well, 10-20 mm Hg.  Murmur: she needs an echo at some point if it was not done at [facility] during her hospital stay.  Records are not available to me.  No signs of CHF.  Given that she now lives so far from [facility], will have her get this closer to home.     Current medicines are reviewed at length with the patient today.  The patient concerns regarding her medicines were addressed.  The following changes have been made:  No change  Labs/ tests ordered today include:  No orders of the defined types were placed in this encounter.   Recommend 150 minutes/week of aerobic exercise Low fat, low carb, high fiber diet recommended  Disposition:   FU prn, as she has moved to [place] which is well [place].  This is not a convenient location for her to come to so she will find a doctor there.   Signed, Larae Grooms, MD  06/05/2021 4:09 PM    [facility] [address], [place], [place]  [address] Phone: [phone]; Fax: [phone]

## 2021-06-05 ENCOUNTER — Other Ambulatory Visit: Payer: Self-pay

## 2021-06-05 ENCOUNTER — Ambulatory Visit: Payer: Medicare Other | Admitting: Interventional Cardiology

## 2021-06-05 ENCOUNTER — Encounter: Payer: Self-pay | Admitting: Interventional Cardiology

## 2021-06-05 VITALS — BP 126/70 | HR 105 | Ht 60.5 in | Wt 216.6 lb

## 2021-06-05 DIAGNOSIS — R011 Cardiac murmur, unspecified: Secondary | ICD-10-CM

## 2021-06-05 DIAGNOSIS — Z01810 Encounter for preprocedural cardiovascular examination: Secondary | ICD-10-CM

## 2021-06-05 DIAGNOSIS — R600 Localized edema: Secondary | ICD-10-CM

## 2021-06-05 NOTE — Patient Instructions (Signed)
Medication Instructions:  Your physician recommends that you continue on your current medications as directed. Please refer to the Current Medication list given to you today.  *If you need a refill on your cardiac medications before your next appointment, please call your pharmacy*   Lab Work: NONE If you have labs (blood work) drawn today and your tests are completely normal, you will receive your results only by: [facility] (if you have MyChart) OR A paper copy in the mail If you have any lab test that is abnormal or we need to change your treatment, we will call you to review the results.   Testing/Procedures: Your physician has requested that you have an echocardiogram. Echocardiography is a painless test that uses sound waves to create images of your heart. It provides your doctor with information about the size and shape of your heart and how well your heart's chambers and valves are working. This procedure takes approximately one hour. There are no restrictions for this procedure.    Follow UP :   Patient will establish care with provider near place of residence

## 2021-06-11 ENCOUNTER — Ambulatory Visit: Payer: Medicare Other

## 2021-06-11 DIAGNOSIS — M329 Systemic lupus erythematosus, unspecified: Secondary | ICD-10-CM | POA: Diagnosis not present

## 2021-06-11 DIAGNOSIS — Z880 Allergy status to penicillin: Secondary | ICD-10-CM | POA: Diagnosis not present

## 2021-06-11 DIAGNOSIS — R7303 Prediabetes: Secondary | ICD-10-CM | POA: Diagnosis not present

## 2021-06-11 DIAGNOSIS — D4121 Neoplasm of uncertain behavior of right ureter: Secondary | ICD-10-CM | POA: Diagnosis not present

## 2021-06-11 DIAGNOSIS — Z79899 Other long term (current) drug therapy: Secondary | ICD-10-CM | POA: Diagnosis not present

## 2021-06-11 DIAGNOSIS — Z981 Arthrodesis status: Secondary | ICD-10-CM | POA: Diagnosis not present

## 2021-06-11 DIAGNOSIS — M797 Fibromyalgia: Secondary | ICD-10-CM | POA: Diagnosis not present

## 2021-06-11 DIAGNOSIS — Z882 Allergy status to sulfonamides status: Secondary | ICD-10-CM | POA: Diagnosis not present

## 2021-06-11 DIAGNOSIS — Z7982 Long term (current) use of aspirin: Secondary | ICD-10-CM | POA: Diagnosis not present

## 2021-06-11 DIAGNOSIS — I10 Essential (primary) hypertension: Secondary | ICD-10-CM | POA: Diagnosis not present

## 2021-06-11 DIAGNOSIS — R001 Bradycardia, unspecified: Secondary | ICD-10-CM | POA: Diagnosis not present

## 2021-06-11 DIAGNOSIS — Z86018 Personal history of other benign neoplasm: Secondary | ICD-10-CM | POA: Diagnosis not present

## 2021-06-11 DIAGNOSIS — G4730 Sleep apnea, unspecified: Secondary | ICD-10-CM | POA: Diagnosis not present

## 2021-06-11 DIAGNOSIS — J449 Chronic obstructive pulmonary disease, unspecified: Secondary | ICD-10-CM | POA: Diagnosis not present

## 2021-06-11 DIAGNOSIS — R52 Pain, unspecified: Secondary | ICD-10-CM | POA: Diagnosis not present

## 2021-06-12 ENCOUNTER — Encounter: Payer: Self-pay | Admitting: Cardiology

## 2021-06-15 ENCOUNTER — Ambulatory Visit: Payer: Medicare Other

## 2021-06-19 ENCOUNTER — Other Ambulatory Visit: Payer: Self-pay

## 2021-06-19 ENCOUNTER — Telehealth: Payer: Self-pay | Admitting: Cardiology

## 2021-06-19 DIAGNOSIS — R011 Cardiac murmur, unspecified: Secondary | ICD-10-CM

## 2021-06-19 DIAGNOSIS — R600 Localized edema: Secondary | ICD-10-CM

## 2021-06-19 DIAGNOSIS — Z01810 Encounter for preprocedural cardiovascular examination: Secondary | ICD-10-CM

## 2021-06-19 NOTE — Telephone Encounter (Signed)
Called to say that the need the ordered to be faxed over for echocardiogram. Stated we faxed this morning but they havent received it. Asking that it be fax again to [phone]. Please advise

## 2021-06-19 NOTE — Telephone Encounter (Signed)
Orders have been refaxed.

## 2021-06-20 DIAGNOSIS — R600 Localized edema: Secondary | ICD-10-CM | POA: Diagnosis not present

## 2021-06-20 DIAGNOSIS — Z01810 Encounter for preprocedural cardiovascular examination: Secondary | ICD-10-CM | POA: Diagnosis not present

## 2021-06-20 DIAGNOSIS — R011 Cardiac murmur, unspecified: Secondary | ICD-10-CM | POA: Diagnosis not present

## 2021-06-28 ENCOUNTER — Other Ambulatory Visit: Payer: Self-pay | Admitting: Cardiology

## 2021-07-04 ENCOUNTER — Ambulatory Visit: Payer: Medicare Other

## 2021-09-03 DIAGNOSIS — J208 Acute bronchitis due to other specified organisms: Secondary | ICD-10-CM | POA: Diagnosis not present

## 2021-09-04 DIAGNOSIS — J209 Acute bronchitis, unspecified: Secondary | ICD-10-CM | POA: Diagnosis not present

## 2021-09-04 DIAGNOSIS — R509 Fever, unspecified: Secondary | ICD-10-CM | POA: Diagnosis not present

## 2021-09-04 DIAGNOSIS — J069 Acute upper respiratory infection, unspecified: Secondary | ICD-10-CM | POA: Diagnosis not present

## 2021-09-04 DIAGNOSIS — R059 Cough, unspecified: Secondary | ICD-10-CM | POA: Diagnosis not present

## 2021-09-04 DIAGNOSIS — J449 Chronic obstructive pulmonary disease, unspecified: Secondary | ICD-10-CM | POA: Diagnosis not present

## 2021-09-17 DIAGNOSIS — I10 Essential (primary) hypertension: Secondary | ICD-10-CM | POA: Diagnosis not present

## 2021-09-17 DIAGNOSIS — R7303 Prediabetes: Secondary | ICD-10-CM | POA: Diagnosis not present

## 2021-09-17 DIAGNOSIS — E7800 Pure hypercholesterolemia, unspecified: Secondary | ICD-10-CM | POA: Diagnosis not present

## 2021-09-17 DIAGNOSIS — J45909 Unspecified asthma, uncomplicated: Secondary | ICD-10-CM | POA: Diagnosis not present

## 2021-09-26 DIAGNOSIS — J449 Chronic obstructive pulmonary disease, unspecified: Secondary | ICD-10-CM | POA: Diagnosis not present

## 2021-09-26 DIAGNOSIS — R059 Cough, unspecified: Secondary | ICD-10-CM | POA: Diagnosis not present

## 2021-10-17 ENCOUNTER — Telehealth: Payer: Self-pay | Admitting: *Deleted

## 2021-10-17 NOTE — Telephone Encounter (Signed)
Left message to call office

## 2021-10-17 NOTE — Telephone Encounter (Signed)
Patient had echo done at [facility]

## 2021-10-17 NOTE — Telephone Encounter (Signed)
° ° °  Echo showed normal LV/RV function.  Some calcium on the aortic valve but it functions normally.   F/u prn  Jettie Booze, MD

## 2021-10-21 NOTE — Telephone Encounter (Signed)
I spoke with patient and reviewed echo results with her 

## 2021-10-28 DIAGNOSIS — J449 Chronic obstructive pulmonary disease, unspecified: Secondary | ICD-10-CM | POA: Diagnosis not present

## 2021-10-28 DIAGNOSIS — R059 Cough, unspecified: Secondary | ICD-10-CM | POA: Diagnosis not present

## 2021-11-29 DIAGNOSIS — G4733 Obstructive sleep apnea (adult) (pediatric): Secondary | ICD-10-CM | POA: Diagnosis not present

## 2022-01-08 ENCOUNTER — Ambulatory Visit
Admission: EM | Admit: 2022-01-08 | Discharge: 2022-01-08 | Disposition: A | Discharge status: Home / Self Care | Payer: Medicare Other | Attending: Emergency Medicine | Admitting: Emergency Medicine

## 2022-01-08 ENCOUNTER — Other Ambulatory Visit: Payer: Self-pay

## 2022-01-08 ENCOUNTER — Encounter: Payer: Self-pay | Admitting: Emergency Medicine

## 2022-01-08 DIAGNOSIS — S40861A Insect bite (nonvenomous) of right upper arm, initial encounter: Secondary | ICD-10-CM

## 2022-01-08 DIAGNOSIS — W57XXXA Bitten or stung by nonvenomous insect and other nonvenomous arthropods, initial encounter: Secondary | ICD-10-CM | POA: Diagnosis not present

## 2022-01-08 DIAGNOSIS — L03113 Cellulitis of right upper limb: Secondary | ICD-10-CM | POA: Diagnosis not present

## 2022-01-08 MED ORDER — DOXYCYCLINE HYCLATE 100 MG PO CAPS: 100.0000 mg | ORAL_CAPSULE | Freq: Two times a day (BID) | ORAL | 0 refills | Status: AC

## 2022-01-08 NOTE — Discharge Instructions (Addendum)
Take the doxycycline as directed.  Please have this rechecked in 1 to 2 days, either by your primary care provider or at an urgent care at home or return here.  ? ?Go to the emergency department if you have signs of worsening infection such as increased redness, fever, or other concerning symptoms.   ? ? ?

## 2022-01-08 NOTE — ED Triage Notes (Signed)
Pt presents with an insect bite x 3 days  ?

## 2022-01-08 NOTE — ED Provider Notes (Signed)
?[facility] ? ? ? ?CSN: [id number] ?Arrival date & time: 01/08/22  8588 ? ? ?  ? ?History   ?Chief Complaint ?Chief Complaint  ?Patient presents with  ? Insect Bite  ? ? ?HPI ?Jessica Watts is a 76 y.o. female.  Patient presents with redness due to an insect bite on her right upper arm x3 days.  She believes she was bitten by a fire and.  The red area is getting larger.  She denies fever, chills, open wound, drainage, or other symptoms.  No treatment at home.  Her medical history includes hypertension, SVT, type 2 diabetes, COPD, lupus, fibromyalgia, migraine headaches, neuropathy, restless leg syndrome, GERD, peptic ulcer disease, morbid obesity. ? ?The history is provided by the patient and medical records.  ? ?Past Medical History:  ?Diagnosis Date  ? Arthritis   ? Asthma   ? Complication of anesthesia   ? has difficulties related to being Native American; multiple days in hospital after hysterectomy years ago  ? COPD (chronic obstructive pulmonary disease) ([place])   ? Depression   ? in the past  ? Fibromyalgia   ? GERD (gastroesophageal reflux disease)   ? Headache(784.0)   ? migraines  ? Hyperlipidemia   ? Hypertension   ? Lupus ([place])   ? Macular degeneration   ? left eye  ? Migraine   ? Neuropathy   ? OSA (obstructive sleep apnea) 04/22/2016  ? Moderate with AHI 16/hr on CPAP  ? Peptic ulcer disease   ? Restless legs   ? SVT (supraventricular tachycardia) ([place]) 12/03/2015  ? ablation for SVT 2017  ? ? ?Patient Active Problem List  ? Diagnosis Date Noted  ? Spondylolisthesis of lumbar region 07/02/2018  ? Morbid (severe) obesity due to excess calories ([place]) 07/02/2018  ? Low back pain 07/02/2018  ? Edema of extremities 05/13/2018  ? Presence of left artificial knee joint 04/14/2017  ? Other intervertebral disc degeneration, lumbar region 04/14/2017  ? Lumbar foraminal stenosis 04/14/2017  ? Arthritis of left knee 10/01/2016  ? OSA on CPAP 04/22/2016  ? SVT (supraventricular tachycardia) ([place])  11/08/2015  ? SOB (shortness of breath) 08/23/2015  ? Allergic rhinitis 08/22/2015  ? Absolute anemia 08/22/2015  ? Essential (primary) hypertension 08/22/2015  ? Gastro-esophageal reflux disease without esophagitis 08/22/2015  ? Hereditary and idiopathic neuropathy 08/22/2015  ? Migraine with typical aura 08/22/2015  ? Unilateral primary osteoarthritis, left knee 08/22/2015  ? Borderline diabetes mellitus 08/22/2015  ? Pure hypercholesterolemia 08/22/2015  ? 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 Fast heart beat 08/22/2015  ? Absence of bladder continence 08/22/2015  ? Apnea, sleep 07/16/2015  ? Cataract 09/26/2013  ? Cataract of left eye 08/29/2013  ? Cervical radiculopathy 07/20/2012  ? Ulnar neuropathy at elbow 07/20/2012  ? ? ?Past Surgical History:  ?Procedure Laterality Date  ? ABDOMINAL HYSTERECTOMY    ? ABLATION OF DYSRHYTHMIC FOCUS  12/03/2015  ? SVT  ? ANTERIOR CERVICAL DECOMP/DISCECTOMY FUSION  07/20/2012  ? Procedure: ANTERIOR CERVICAL DECOMPRESSION/DISCECTOMY FUSION 2 LEVELS;  Surgeon: Charlie Pitter, MD;  Location: [facility];  Service: Neurosurgery;  Laterality: N/A;  cervical three- four , four -  five  ? APPENDECTOMY    ? BACK SURGERY    ? lumbar fusion  ? CARDIAC CATHETERIZATION N/A 09/14/2015  ? Procedure: Left Heart Cath and Coronary Angiography;  Surgeon: Sherren Mocha, MD;  Location: [facility];  Service: Cardiovascular;  Laterality: N/A;  ? CARPAL TUNNEL RELEASE    ?  bilat  ? CERVICAL FUSION    ? C2/3  ? ELECTROPHYSIOLOGIC STUDY N/A 12/03/2015  ? Procedure: SVT Ablation;  Surgeon: Evans Lance, MD;  Location: [facility];  Service: Cardiovascular;  Laterality: N/A;  ? EYE SURGERY Bilateral   ? cataract surgery with lens implant  ? KNEE ARTHROSCOPY Left   ? NEUROMA SURGERY    ? L foot  ? SHOULDER SURGERY    ? TONSILLECTOMY    ? TOTAL KNEE ARTHROPLASTY Left 10/01/2016  ? Procedure: LEFT TOTAL KNEE ARTHROPLASTY;  Surgeon: Marybelle Killings, MD;  Location: [facility];  Service: Orthopedics;   Laterality: Left;  ? ULNAR NERVE TRANSPOSITION  07/20/2012  ? Procedure: ULNAR NERVE DECOMPRESSION/TRANSPOSITION;  Surgeon: Charlie Pitter, MD;  Location: [facility];  Service: Neurosurgery;  Laterality: Left;  ? ? ?OB History   ?No obstetric history on file. ?  ? ? ? ?Home Medications   ? ?Prior to Admission medications   ?Medication Sig Start Date End Date Taking? Authorizing Provider  ?doxycycline (VIBRAMYCIN) 100 MG capsule Take 1 capsule (100 mg total) by mouth 2 (two) times daily for 7 days. 01/08/22 01/15/22 Yes Sharion Balloon, NP  ?albuterol (VENTOLIN HFA) 108 (90 Base) MCG/ACT inhaler Inhale 2 puffs into the lungs every 4 (four) hours as needed for wheezing or shortness of breath.  01/24/14   [provider]  ?aspirin EC 81 MG tablet Take 81 mg by mouth daily.    [provider]  ?calcium carbonate (OS-CAL - DOSED IN MG OF ELEMENTAL CALCIUM) 1250 (500 Ca) MG tablet Take 1 tablet by mouth daily.    [provider]  ?cetirizine (ZYRTEC) 10 MG tablet Take by mouth.    [provider]  ?chlorpheniramine (CHLOR-TRIMETON) 4 MG tablet Take 4 mg by mouth daily. ?Patient not taking: Reported on 06/05/2021    [provider]  ?cholecalciferol (VITAMIN D) 1000 units tablet Take 1,000 Units by mouth daily. ?Patient not taking: Reported on 06/05/2021    [provider]  ?fluticasone (FLONASE) 50 MCG/ACT nasal spray Place 1 spray into both nostrils daily as needed for allergies. For nasal congestion    [provider]  ?hydrochlorothiazide (HYDRODIURIL) 25 MG tablet Take 1 tablet by mouth once daily 12/05/20   Sueanne Margarita, MD  ?losartan (COZAAR) 100 MG tablet Take 100 mg by mouth daily. 03/06/18   [provider]  ?olmesartan (BENICAR) 20 MG tablet Take 20 mg by mouth daily. 05/27/21   [provider]  ?omeprazole (PRILOSEC) 40 MG capsule Take 40 mg by mouth daily.    [provider]  ?potassium chloride (KLOR-CON) 10 MEQ tablet TAKE 1  TABLET BY MOUTH  DAILY 06/28/21   Sueanne Margarita, MD  ? ? ?Family History ?Family History  ?Problem Relation Age of Onset  ? Heart disease Mother   ? Heart attack Mother   ? Hypertension Mother   ? Heart attack Father   ? Heart disease Father   ? Hypertension Father   ? Breast cancer Sister   ? Breast cancer Sister   ? ? ?Social History ?Social History  ? ?Tobacco Use  ? Smoking status: Never  ? Smokeless tobacco: Never  ?Vaping Use  ? Vaping Use: Never used  ?Substance Use Topics  ? Alcohol use: Yes  ?  Comment: occasional wine  ? Drug use: No  ? ? ? ?Allergies   ?Ace inhibitors, Amoxicillin, and Sulfa antibiotics ? ? ?Review of Systems ?Review  of Systems  ?Constitutional:  Negative for chills and fever.  ?Skin:  Positive for color change and wound.  ?Neurological:  Negative for weakness and numbness.  ?All other systems reviewed and are negative. ? ? ?Physical Exam ?Triage Vital Signs ?ED Triage Vitals  ?Enc Vitals Group  ?   BP   ?   Pulse   ?   Resp   ?   Temp   ?   Temp src   ?   SpO2   ?   Weight   ?   Height   ?   Head Circumference   ?   Peak Flow   ?   Pain Score   ?   Pain Loc   ?   Pain Edu?   ?   Excl. in House?   ? ?No data found. ? ?Updated Vital Signs ?BP (!) 155/87   Pulse 99   Temp 98.1 ?F (36.7 ?C)   Resp 18   SpO2 96%  ? ?Visual Acuity ?Right Eye Distance:   ?Left Eye Distance:   ?Bilateral Distance:   ? ?Right Eye Near:   ?Left Eye Near:    ?Bilateral Near:    ? ?Physical Exam ?Vitals and nursing note reviewed.  ?Constitutional:   ?   General: She is not in acute distress. ?   Appearance: She is well-developed. She is obese. She is not ill-appearing.  ?Cardiovascular:  ?   Rate and Rhythm: Normal rate and regular rhythm.  ?   Heart sounds: Normal heart sounds.  ?Pulmonary:  ?   Effort: Pulmonary effort is normal. No respiratory distress.  ?   Breath sounds: Normal breath sounds.  ?Musculoskeletal:     ?   General: Normal range of motion.  ?   Cervical back: Neck supple.  ?Skin: ?   General: Skin  is warm and dry.  ?   Capillary Refill: Capillary refill takes less than 2 seconds.  ?   Findings: Erythema and lesion present.  ?   Comments: Right upper arm erythematous with central area of deeper erythema a

## 2022-01-10 DIAGNOSIS — T63421A Toxic effect of venom of ants, accidental (unintentional), initial encounter: Secondary | ICD-10-CM | POA: Diagnosis not present

## 2022-01-10 DIAGNOSIS — I509 Heart failure, unspecified: Secondary | ICD-10-CM | POA: Diagnosis not present

## 2022-01-10 DIAGNOSIS — I110 Hypertensive heart disease with heart failure: Secondary | ICD-10-CM | POA: Diagnosis not present

## 2022-01-10 DIAGNOSIS — Z7689 Persons encountering health services in other specified circumstances: Secondary | ICD-10-CM | POA: Diagnosis not present

## 2022-02-07 DIAGNOSIS — T63421A Toxic effect of venom of ants, accidental (unintentional), initial encounter: Secondary | ICD-10-CM | POA: Diagnosis not present

## 2022-02-07 DIAGNOSIS — I110 Hypertensive heart disease with heart failure: Secondary | ICD-10-CM | POA: Diagnosis not present

## 2022-02-07 DIAGNOSIS — I509 Heart failure, unspecified: Secondary | ICD-10-CM | POA: Diagnosis not present

## 2022-02-24 DIAGNOSIS — Z1231 Encounter for screening mammogram for malignant neoplasm of breast: Secondary | ICD-10-CM | POA: Diagnosis not present

## 2022-03-11 DIAGNOSIS — Z1231 Encounter for screening mammogram for malignant neoplasm of breast: Secondary | ICD-10-CM | POA: Diagnosis not present

## 2022-03-13 DIAGNOSIS — R928 Other abnormal and inconclusive findings on diagnostic imaging of breast: Secondary | ICD-10-CM | POA: Diagnosis not present

## 2022-03-13 DIAGNOSIS — R922 Inconclusive mammogram: Secondary | ICD-10-CM | POA: Diagnosis not present

## 2022-03-19 ENCOUNTER — Other Ambulatory Visit: Payer: Self-pay | Admitting: Cardiology

## 2022-03-21 ENCOUNTER — Other Ambulatory Visit: Payer: Self-pay | Admitting: Cardiology

## 2022-04-29 ENCOUNTER — Other Ambulatory Visit: Payer: Self-pay | Admitting: Cardiology

## 2022-05-09 DIAGNOSIS — I110 Hypertensive heart disease with heart failure: Secondary | ICD-10-CM | POA: Diagnosis not present

## 2022-05-09 DIAGNOSIS — Z0130 Encounter for examination of blood pressure without abnormal findings: Secondary | ICD-10-CM | POA: Diagnosis not present

## 2022-05-09 DIAGNOSIS — I509 Heart failure, unspecified: Secondary | ICD-10-CM | POA: Diagnosis not present

## 2022-05-23 DIAGNOSIS — Z0000 Encounter for general adult medical examination without abnormal findings: Secondary | ICD-10-CM | POA: Diagnosis not present

## 2022-06-12 DIAGNOSIS — G4733 Obstructive sleep apnea (adult) (pediatric): Secondary | ICD-10-CM | POA: Diagnosis not present

## 2022-08-08 DIAGNOSIS — I509 Heart failure, unspecified: Secondary | ICD-10-CM | POA: Diagnosis not present

## 2022-08-08 DIAGNOSIS — I110 Hypertensive heart disease with heart failure: Secondary | ICD-10-CM | POA: Diagnosis not present

## 2022-08-08 DIAGNOSIS — R5383 Other fatigue: Secondary | ICD-10-CM | POA: Diagnosis not present

## 2022-08-08 DIAGNOSIS — R350 Frequency of micturition: Secondary | ICD-10-CM | POA: Diagnosis not present

## 2022-09-15 DIAGNOSIS — Z1211 Encounter for screening for malignant neoplasm of colon: Secondary | ICD-10-CM | POA: Diagnosis not present

## 2022-09-20 DIAGNOSIS — R509 Fever, unspecified: Secondary | ICD-10-CM | POA: Diagnosis not present

## 2022-09-20 DIAGNOSIS — J440 Chronic obstructive pulmonary disease with acute lower respiratory infection: Secondary | ICD-10-CM | POA: Diagnosis not present

## 2022-09-20 DIAGNOSIS — Z7951 Long term (current) use of inhaled steroids: Secondary | ICD-10-CM | POA: Diagnosis not present

## 2022-09-20 DIAGNOSIS — R059 Cough, unspecified: Secondary | ICD-10-CM | POA: Diagnosis not present

## 2022-09-20 DIAGNOSIS — R0602 Shortness of breath: Secondary | ICD-10-CM | POA: Diagnosis not present

## 2022-09-20 DIAGNOSIS — R0603 Acute respiratory distress: Secondary | ICD-10-CM | POA: Diagnosis not present

## 2022-09-20 DIAGNOSIS — J441 Chronic obstructive pulmonary disease with (acute) exacerbation: Secondary | ICD-10-CM | POA: Diagnosis not present

## 2022-09-22 DIAGNOSIS — G4733 Obstructive sleep apnea (adult) (pediatric): Secondary | ICD-10-CM | POA: Diagnosis not present

## 2022-09-24 DIAGNOSIS — B338 Other specified viral diseases: Secondary | ICD-10-CM | POA: Diagnosis not present

## 2022-09-24 DIAGNOSIS — J441 Chronic obstructive pulmonary disease with (acute) exacerbation: Secondary | ICD-10-CM | POA: Diagnosis not present

## 2022-10-14 DIAGNOSIS — R0902 Hypoxemia: Secondary | ICD-10-CM | POA: Diagnosis not present

## 2022-11-14 DIAGNOSIS — R0902 Hypoxemia: Secondary | ICD-10-CM | POA: Diagnosis not present

## 2022-11-18 DIAGNOSIS — R928 Other abnormal and inconclusive findings on diagnostic imaging of breast: Secondary | ICD-10-CM | POA: Diagnosis not present

## 2022-11-18 DIAGNOSIS — N6322 Unspecified lump in the left breast, upper inner quadrant: Secondary | ICD-10-CM | POA: Diagnosis not present
